# Patient Record
Sex: Male | Born: 1950
Health system: Southern US, Community
[De-identification: ages and names within clinical notes are randomized; demographics above are authoritative.]

## PROBLEM LIST (undated history)

## (undated) DIAGNOSIS — E7800 Pure hypercholesterolemia, unspecified: Secondary | ICD-10-CM

## (undated) DIAGNOSIS — I499 Cardiac arrhythmia, unspecified: Secondary | ICD-10-CM

## (undated) DIAGNOSIS — I10 Essential (primary) hypertension: Secondary | ICD-10-CM

## (undated) DIAGNOSIS — C801 Malignant (primary) neoplasm, unspecified: Secondary | ICD-10-CM

## (undated) HISTORY — PX: BIOPSY PROSTATE: PRO28

## (undated) HISTORY — PX: CERVICAL FUSION: SHX112

## (undated) HISTORY — PX: COLONOSCOPY W/ POLYPECTOMY: SHX1380

## (undated) HISTORY — PX: TONSILLECTOMY: SUR1361

---

## 2010-07-04 ENCOUNTER — Emergency Department (HOSPITAL_COMMUNITY)
Admission: EM | Admit: 2010-07-04 | Discharge: 2010-07-04 | Disposition: A | Discharge status: Home / Self Care | Payer: 59 | Attending: Emergency Medicine | Admitting: Emergency Medicine

## 2010-07-04 DIAGNOSIS — R000 Tachycardia, unspecified: Secondary | ICD-10-CM | POA: Insufficient documentation

## 2010-07-04 DIAGNOSIS — E876 Hypokalemia: Secondary | ICD-10-CM | POA: Insufficient documentation

## 2010-07-04 DIAGNOSIS — Z79899 Other long term (current) drug therapy: Secondary | ICD-10-CM | POA: Insufficient documentation

## 2010-07-04 DIAGNOSIS — I10 Essential (primary) hypertension: Secondary | ICD-10-CM | POA: Insufficient documentation

## 2010-07-04 LAB — BASIC METABOLIC PANEL
BUN: 17 mg/dL (ref 6–23)
Calcium: 8.8 mg/dL (ref 8.4–10.5)
Creatinine, Ser: 0.99 mg/dL (ref 0.4–1.5)
GFR calc non Af Amer: >60 mL/min (ref >60)
Glucose, Bld: 130 mg/dL — ABNORMAL HIGH (ref 70–99)
Sodium: 135 mEq/L (ref 135–145)

## 2010-07-04 LAB — CBC
HCT: 42 % (ref 39.0–52.0)
Platelets: 217 10*3/uL (ref 150–400)
RDW: 12.6 % (ref 11.5–15.5)
WBC: 7.4 10*3/uL (ref 4.0–10.5)

## 2010-07-04 LAB — POCT CARDIAC MARKERS
CKMB, poc: 4.5 ng/mL (ref 1.0–8.0)
Myoglobin, poc: 183 ng/mL (ref 12–200)

## 2014-08-07 ENCOUNTER — Other Ambulatory Visit: Payer: Self-pay | Admitting: Urology

## 2014-10-15 ENCOUNTER — Encounter (HOSPITAL_COMMUNITY): Payer: Self-pay

## 2014-10-15 ENCOUNTER — Encounter (HOSPITAL_COMMUNITY)
Admission: RE | Admit: 2014-10-15 | Discharge: 2014-10-15 | Disposition: A | Discharge status: Home / Self Care | Payer: PRIVATE HEALTH INSURANCE | Source: Ambulatory Visit | Attending: Urology | Admitting: Urology

## 2014-10-15 DIAGNOSIS — C61 Malignant neoplasm of prostate: Secondary | ICD-10-CM | POA: Insufficient documentation

## 2014-10-15 HISTORY — DX: Malignant (primary) neoplasm, unspecified: C80.1

## 2014-10-15 HISTORY — DX: Pure hypercholesterolemia, unspecified: E78.00

## 2014-10-15 NOTE — Patient Instructions (Addendum)
[place]  10/15/2014   Your procedure is scheduled on:   11-02-2014 Monday  Enter through [facility] and follow signs to [facility]. Arrive at 0900       AM.  (Limit 1 person with you).  Call this number if you have problems the morning of surgery: [phone]  Or Presurgical Testing [phone].   For Living Will and/or Health Care Power Attorney Forms: please provide copy for your medical record,may bring AM of surgery(Forms should be already notarized -we do not provide this service).(10-15-14 Yes/ No information preferred today).  Remember: Follow any bowel prep instructions per MD office. For Cpap use: Bring mask and tubing only.   Do not eat food/ or drink: After Midnight.      Take these medicines the morning of surgery with A SIP OF WATER:  None.   Do not wear jewelry, make-up or nail polish.  Do not wear deodorant, lotions, powders, or perfumes.   Do not shave legs and under arms- 48 hours(2 days) prior to first CHG shower.(Shaving face and neck okay.)  Do not bring valuables to the hospital.(Hospital is not responsible for lost valuables).  Contacts, dentures or removable bridgework, body piercing, hair pins may not be worn into surgery.  Leave suitcase in the car. After surgery it may be brought to your room.  For patients admitted to the hospital, checkout time is 11:00 AM the day of discharge.(Restricted visitors-Any Persons displaying flu-like symptoms or illness).    Patients discharged the day of surgery will not be allowed to drive home. Must have responsible person with you x 24 hours once discharged.  Name and phone number of your driver: Langley Gauss -spouse [phone] cell     Please read over the following fact sheets that you were given:  CHG(Chlorhexidine Gluconate 4% Surgical Soap) use, MRSA Information, Blood Transfusion fact sheet, Incentive Spirometry Instruction.  Remember : Type/Screen "Blue armbands" - may not be removed once  applied(would result in being retested AM of surgery, if removed).         [place] - Preparing for Surgery Before surgery, you can play an important role.  Because skin is not sterile, your skin needs to be as free of germs as possible.  You can reduce the number of germs on your skin by washing with CHG (chlorahexidine gluconate) soap before surgery.  CHG is an antiseptic cleaner which kills germs and bonds with the skin to continue killing germs even after washing. Please DO NOT use if you have an allergy to CHG or antibacterial soaps.  If your skin becomes reddened/irritated stop using the CHG and inform your nurse when you arrive at Short Stay. Do not shave (including legs and underarms) for at least 48 hours prior to the first CHG shower.  You may shave your face/neck. Please follow these instructions carefully:  1.  Shower with CHG Soap the night before surgery and the  morning of Surgery.  2.  If you choose to wash your hair, wash your hair first as usual with your  normal  shampoo.  3.  After you shampoo, rinse your hair and body thoroughly to remove the  shampoo.                           4.  Use CHG as you would any other liquid soap.  You can apply chg directly  to the skin and wash  Gently with a scrungie or clean washcloth.  5.  Apply the CHG Soap to your body ONLY FROM THE NECK DOWN.   Do not use on face/ open                           Wound or open sores. Avoid contact with eyes, ears mouth and genitals (private parts).                       Wash face,  Genitals (private parts) with your normal soap.             6.  Wash thoroughly, paying special attention to the area where your surgery  will be performed.  7.  Thoroughly rinse your body with warm water from the neck down.  8.  DO NOT shower/wash with your normal soap after using and rinsing off  the CHG Soap.                9.  Pat yourself dry with a clean towel.            10.  Wear clean pajamas.             11.  Place clean sheets on your bed the night of your first shower and do not  sleep with pets. Day of Surgery : Do not apply any lotions/deodorants the morning of surgery.  Please wear clean clothes to the hospital/surgery center.  FAILURE TO FOLLOW THESE INSTRUCTIONS MAY RESULT IN THE CANCELLATION OF YOUR SURGERY PATIENT SIGNATURE_________________________________  NURSE SIGNATURE__________________________________  ________________________________________________________________________   Patrick Salinas  An incentive spirometer is a tool that can help keep your lungs clear and active. This tool measures how well you are filling your lungs with each breath. Taking long deep breaths may help reverse or decrease the chance of developing breathing (pulmonary) problems (especially infection) following:  A long period of time when you are unable to move or be active. BEFORE THE PROCEDURE   If the spirometer includes an indicator to show your best effort, your nurse or respiratory therapist will set it to a desired goal.  If possible, sit up straight or lean slightly forward. Try not to slouch.  Hold the incentive spirometer in an upright position. INSTRUCTIONS FOR USE   Sit on the edge of your bed if possible, or sit up as far as you can in bed or on a chair.  Hold the incentive spirometer in an upright position.  Breathe out normally.  Place the mouthpiece in your mouth and seal your lips tightly around it.  Breathe in slowly and as deeply as possible, raising the piston or the ball toward the top of the column.  Hold your breath for 3-5 seconds or for as long as possible. Allow the piston or ball to fall to the bottom of the column.  Remove the mouthpiece from your mouth and breathe out normally.  Rest for a few seconds and repeat Steps 1 through 7 at least 10 times every 1-2 hours when you are awake. Take your time and take a few normal breaths between deep  breaths.  The spirometer may include an indicator to show your best effort. Use the indicator as a goal to work toward during each repetition.  After each set of 10 deep breaths, practice coughing to be sure your lungs are clear. If you have an incision (the cut made at the time of  surgery), support your incision when coughing by placing a pillow or rolled up towels firmly against it. Once you are able to get out of bed, walk around indoors and cough well. You may stop using the incentive spirometer when instructed by your caregiver.  RISKS AND COMPLICATIONS  Take your time so you do not get dizzy or light-headed.  If you are in pain, you may need to take or ask for pain medication before doing incentive spirometry. It is harder to take a deep breath if you are having pain. AFTER USE  Rest and breathe slowly and easily.  It can be helpful to keep track of a log of your progress. Your caregiver can provide you with a simple table to help with this. If you are using the spirometer at home, follow these instructions: [place] IF:   You are having difficultly using the spirometer.  You have trouble using the spirometer as often as instructed.  Your pain medication is not giving enough relief while using the spirometer.  You develop fever of 100.5 F (38.1 C) or higher. SEEK IMMEDIATE MEDICAL CARE IF:   You cough up bloody sputum that had not been present before.  You develop fever of 102 F (38.9 C) or greater.  You develop worsening pain at or near the incision site. MAKE SURE YOU:   Understand these instructions.  Will watch your condition.  Will get help right away if you are not doing well or get worse. Document Released: 07/24/2006 Document Revised: 06/05/2011 Document Reviewed: 09/24/2006 ExitCare Patient Information 2014 Exit[employer], [place].   ________________________________________________________________________  WHAT IS A BLOOD TRANSFUSION? Blood  Transfusion Information  A transfusion is the replacement of blood or some of its parts. Blood is made up of multiple cells which provide different functions.  Red blood cells carry oxygen and are used for blood loss replacement.  White blood cells fight against infection.  Platelets control bleeding.  Plasma helps clot blood.  Other blood products are available for specialized needs, such as hemophilia or other clotting disorders. BEFORE THE TRANSFUSION  Who gives blood for transfusions?   Healthy volunteers who are fully evaluated to make sure their blood is safe. This is blood bank blood. Transfusion therapy is the safest it has ever been in the practice of medicine. Before blood is taken from a donor, a complete history is taken to make sure that person has no history of diseases nor engages in risky social behavior (examples are intravenous drug use or sexual activity with multiple partners). The donor's travel history is screened to minimize risk of transmitting infections, such as malaria. The donated blood is tested for signs of infectious diseases, such as HIV and hepatitis. The blood is then tested to be sure it is compatible with you in order to minimize the chance of a transfusion reaction. If you or a relative donates blood, this is often done in anticipation of surgery and is not appropriate for emergency situations. It takes many days to process the donated blood. RISKS AND COMPLICATIONS Although transfusion therapy is very safe and saves many lives, the main dangers of transfusion include:   Getting an infectious disease.  Developing a transfusion reaction. This is an allergic reaction to something in the blood you were given. Every precaution is taken to prevent this. The decision to have a blood transfusion has been considered carefully by your caregiver before blood is given. Blood is not given unless the benefits outweigh the risks. AFTER THE TRANSFUSION  Right after  receiving a blood transfusion, you will usually feel much better and more energetic. This is especially true if your red blood cells have gotten low (anemic). The transfusion raises the level of the red blood cells which carry oxygen, and this usually causes an energy increase.  The nurse administering the transfusion will monitor you carefully for complications. HOME CARE INSTRUCTIONS  No special instructions are needed after a transfusion. You may find your energy is better. Speak with your caregiver about any limitations on activity for underlying diseases you may have. SEEK MEDICAL CARE IF:   Your condition is not improving after your transfusion.  You develop redness or irritation at the intravenous (IV) site. SEEK IMMEDIATE MEDICAL CARE IF:  Any of the following symptoms occur over the next 12 hours:  Shaking chills.  You have a temperature by mouth above 102 F (38.9 C), not controlled by medicine.  Chest, back, or muscle pain.  People around you feel you are not acting correctly or are confused.  Shortness of breath or difficulty breathing.  Dizziness and fainting.  You get a rash or develop hives.  You have a decrease in urine output.  Your urine turns a dark color or changes to pink, red, or brown. Any of the following symptoms occur over the next 10 days:  You have a temperature by mouth above 102 F (38.9 C), not controlled by medicine.  Shortness of breath.  Weakness after normal activity.  The white part of the eye turns yellow (jaundice).  You have a decrease in the amount of urine or are urinating less often.  Your urine turns a dark color or changes to pink, red, or brown. Document Released: 03/10/2000 Document Revised: 06/05/2011 Document Reviewed: 10/28/2007 [facility] Patient Information 2014 [place], [place].  _______________________________________________________________________

## 2014-10-15 NOTE — Pre-Procedure Instructions (Signed)
EKG/ CXR  10-08-14 reports, labs CBC/d, CMP results with chart- done at [facility].

## 2014-10-21 NOTE — H&P (Signed)
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 This prompted a prostate needle biopsy by Dr. Risa Grill on 4/27/6 that demonstrated Gleason 3+3=6 adenocarcinoma of the prostate with 3 out of 12 biopsy cores positive for malignancy. He is very healthy overall with well controlled hypertension and dyslipidemia. He has a questionable paternal family history of prostate cancer.    TNM stage: cT1c Nx Mx  PSA: 5.13  Gleason score: 3+3=6  Biopsy (07/21/24): 3/12 cores positive -- L lateral apex (30%), L apex (< 5%), R lateral base (70% - DI)  Prostate volume: 34 cc  PSAD: 0.15    Nomogram  OC disease: 61%  EPE: 39%  SVI: 1%  LNI: 1%  PFS (surgery): 95% at 3 years, 90% at 8 years     Urinary function: He denies any significant voiding symptoms. IPSS is 0.  Erectile function: He denies erectile dysfunction. SHIM score is 25.     Interval history:    He follows up today for a preoperative visit prior to his upcoming surgery. He continues to wish to proceed with surgical treatment of his prostate cancer and remains well informed. He assures me that he has had no changes in his overall health over the past 2 months. He has stopped Lipitor with resolution of his knee pain.     Past Medical History Problems  1. History of hypercholesterolemia (Z86.39) 2. History of hypertension (Z86.79)  Surgical History Problems  1. History of Adenoidectomy 2. History of Neck Surgery 3. History of Tonsillectomy  Current Meds 1. Hydrochlorothiazide 25 MG Oral Tablet;  Therapy: (Recorded:14Apr2016) to Recorded  Allergies Medication  1. No Known Drug Allergies Non-Medication  2. Fresh Fruit  Family History Problems  1. Family history of Deceased : Mother, Father 2. Family history of congestive heart failure (Z82.49) :  Mother 3. Denied: Family history of prostate cancer  Social History Problems  1. Adopted a child   2 sons 2. Alcohol use (Z78.9)   1 per day 3. Married 4. 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 No acute distress.  ENT:. The ears and nose are normal in appearance.  Neck: The appearance of the neck is normal and no neck mass is present.  Pulmonary: No respiratory distress, normal respiratory rhythm and effort and clear bilateral breath sounds.  Cardiovascular: Heart rate and rhythm are normal . No peripheral edema.  Abdomen: The abdomen is soft and nontender. No masses are palpated. No CVA tenderness. No hernias are palpable. No hepatosplenomegaly noted.  Neuro/Psych:. Mood and affect are appropriate.    Results/Data Urine [Data Includes: Last 1 Day]   [id number]  COLOR YELLOW   APPEARANCE CLEAR   SPECIFIC GRAVITY 1.025   pH 6.0   GLUCOSE NEG mg/dL  BILIRUBIN NEG   KETONE NEG mg/dL  BLOOD NEG   PROTEIN NEG mg/dL  UROBILINOGEN 0.2 mg/dL  NITRITE NEG   LEUKOCYTE ESTERASE NEG   Selected Results  PSA REFLEX TO FREE 14Apr2016 02:16PM Rana Snare  SPECIMEN TYPE: BLOOD   Test Name Result Flag  Reference  PSA 5.13 ng/mL H <=4.00  TEST METHODOLOGY: ECLIA PSA (ELECTROCHEMILUMINESCENCE IMMUNOASSAY)  PSA, FREE 0.28 ng/mL    PSA, %FREE 5 % L > 25  PROBABILITY OF PROSTATE CANCER   (FOR MEN WITH NON-SUSPICIOUS DRE RESULTS AND PSA BETWEEN 4 AND   10 NG/ML, BY PATIENT AGE)     % FREE PSA                          PATIENT AGE                          50 TO 59 YEARS  60 TO 69 YEARS  >70 YEARS    <=10%                  49.2%           57.5%          64.5%    11 - 18%               26.9%           33.9%           40.8%    19 - 25%               18.3%           23.9%          29.7%    >25%                    9.1%           12.2%          15.8%   Assessment Assessed  1. Prostate cancer (C61)  Plan Health Maintenance  1. UA With REFLEX; [Do Not Release]; Status:Complete;   Done: [id number] 03:54PM  Discussion/Summary 1. Prostate cancer: He does wish to proceed with surgical treatment of his prostate cancer as we have previously discussed. We have reviewed the potential risks and benefits of treatment as well as the expected postoperative recovery process. He feels very well informed and is ready to proceed. He will undergo a bilateral nerve sparing robot-assisted laparoscopic radical prostatectomy.    Cc: Audria Nine, NP     Signatures Electronically signed by : Raynelle Bring, M.D.; Oct 15 2014  5:27PM EST

## 2014-11-02 ENCOUNTER — Inpatient Hospital Stay (HOSPITAL_COMMUNITY)
Admission: RE | Admit: 2014-11-02 | Discharge: 2014-11-03 | DRG: 708 | Disposition: A | Discharge status: Home / Self Care | Payer: PRIVATE HEALTH INSURANCE | Source: Ambulatory Visit | Attending: Urology | Admitting: Urology

## 2014-11-02 ENCOUNTER — Inpatient Hospital Stay (HOSPITAL_COMMUNITY): Payer: PRIVATE HEALTH INSURANCE | Admitting: Anesthesiology

## 2014-11-02 ENCOUNTER — Encounter (HOSPITAL_COMMUNITY): Payer: Self-pay | Admitting: *Deleted

## 2014-11-02 ENCOUNTER — Encounter (HOSPITAL_COMMUNITY)
Admission: RE | Disposition: A | Discharge status: Home / Self Care | Payer: Self-pay | Source: Ambulatory Visit | Attending: Urology

## 2014-11-02 DIAGNOSIS — Z01812 Encounter for preprocedural laboratory examination: Secondary | ICD-10-CM

## 2014-11-02 DIAGNOSIS — Z8249 Family history of ischemic heart disease and other diseases of the circulatory system: Secondary | ICD-10-CM

## 2014-11-02 DIAGNOSIS — E785 Hyperlipidemia, unspecified: Secondary | ICD-10-CM | POA: Diagnosis present

## 2014-11-02 DIAGNOSIS — I10 Essential (primary) hypertension: Secondary | ICD-10-CM | POA: Diagnosis present

## 2014-11-02 DIAGNOSIS — C61 Malignant neoplasm of prostate: Secondary | ICD-10-CM | POA: Diagnosis not present

## 2014-11-02 HISTORY — PX: ROBOT ASSISTED LAPAROSCOPIC RADICAL PROSTATECTOMY: SHX5141

## 2014-11-02 LAB — HEMOGLOBIN AND HEMATOCRIT, BLOOD
HCT: 42.6 % (ref 39.0–52.0)
Hemoglobin: 14.7 g/dL (ref 13.0–17.0)

## 2014-11-02 LAB — TYPE AND SCREEN
ABO/RH(D): O POS
Antibody Screen: NEGATIVE

## 2014-11-02 SURGERY — ROBOTIC ASSISTED LAPAROSCOPIC RADICAL PROSTATECTOMY LEVEL 1
Anesthesia: General

## 2014-11-02 MED ORDER — MIDAZOLAM HCL 2 MG/2ML IJ SOLN
INTRAMUSCULAR | Status: AC
  Filled 2014-11-02: qty 4

## 2014-11-02 MED ORDER — HYDROMORPHONE HCL 2 MG/ML IJ SOLN
INTRAMUSCULAR | Status: AC
  Filled 2014-11-02: qty 1

## 2014-11-02 MED ORDER — BUPIVACAINE-EPINEPHRINE 0.25% -1:200000 IJ SOLN
INTRAMUSCULAR | Status: DC | PRN
  Administered 2014-11-02: 30 mL

## 2014-11-02 MED ORDER — LIDOCAINE HCL (CARDIAC) 20 MG/ML IV SOLN
INTRAVENOUS | Status: AC
  Filled 2014-11-02: qty 5

## 2014-11-02 MED ORDER — PHENYLEPHRINE 40 MCG/ML (10ML) SYRINGE FOR IV PUSH (FOR BLOOD PRESSURE SUPPORT)
PREFILLED_SYRINGE | INTRAVENOUS | Status: AC
  Filled 2014-11-02: qty 10

## 2014-11-02 MED ORDER — MORPHINE SULFATE 10 MG/ML IJ SOLN: 2.0000 mg | INTRAMUSCULAR | Status: DC | PRN

## 2014-11-02 MED ORDER — FENTANYL CITRATE (PF) 100 MCG/2ML IJ SOLN
INTRAMUSCULAR | Status: DC | PRN
  Administered 2014-11-02: 100 ug via INTRAVENOUS
  Administered 2014-11-02 (×3): 50 ug via INTRAVENOUS

## 2014-11-02 MED ORDER — HYDROMORPHONE HCL 1 MG/ML IJ SOLN
INTRAMUSCULAR | Status: AC
  Filled 2014-11-02: qty 1

## 2014-11-02 MED ORDER — PROPOFOL 10 MG/ML IV BOLUS
INTRAVENOUS | Status: DC | PRN
  Administered 2014-11-02: 180 mg via INTRAVENOUS

## 2014-11-02 MED ORDER — PROPOFOL 10 MG/ML IV BOLUS
INTRAVENOUS | Status: AC
  Filled 2014-11-02: qty 20

## 2014-11-02 MED ORDER — HYDROMORPHONE HCL 1 MG/ML IJ SOLN
INTRAMUSCULAR | Status: DC | PRN
  Administered 2014-11-02: 0.5 mg via INTRAVENOUS
  Administered 2014-11-02: 1 mg via INTRAVENOUS
  Administered 2014-11-02: 0.5 mg via INTRAVENOUS

## 2014-11-02 MED ORDER — ROCURONIUM BROMIDE 100 MG/10ML IV SOLN
INTRAVENOUS | Status: AC
  Filled 2014-11-02: qty 1

## 2014-11-02 MED ORDER — BUPIVACAINE-EPINEPHRINE (PF) 0.25% -1:200000 IJ SOLN
INTRAMUSCULAR | Status: AC
  Filled 2014-11-02: qty 30

## 2014-11-02 MED ORDER — ROCURONIUM BROMIDE 100 MG/10ML IV SOLN
INTRAVENOUS | Status: DC | PRN
  Administered 2014-11-02: 20 mg via INTRAVENOUS
  Administered 2014-11-02: 35 mg via INTRAVENOUS
  Administered 2014-11-02: 20 mg via INTRAVENOUS
  Administered 2014-11-02: 10 mg via INTRAVENOUS
  Administered 2014-11-02: 5 mg via INTRAVENOUS

## 2014-11-02 MED ORDER — ACETAMINOPHEN 325 MG PO TABS
650.0000 mg | ORAL_TABLET | ORAL | Status: DC | PRN
  Administered 2014-11-03: 650 mg via ORAL
  Filled 2014-11-02: qty 2

## 2014-11-02 MED ORDER — CEFAZOLIN SODIUM-DEXTROSE 2-3 GM-% IV SOLR
INTRAVENOUS | Status: AC
  Filled 2014-11-02: qty 50

## 2014-11-02 MED ORDER — SODIUM CHLORIDE 0.9 % IR SOLN
Status: DC | PRN
  Administered 2014-11-02: 1000 mL

## 2014-11-02 MED ORDER — ONDANSETRON HCL 4 MG/2ML IJ SOLN
INTRAMUSCULAR | Status: AC
  Filled 2014-11-02: qty 2

## 2014-11-02 MED ORDER — DIPHENHYDRAMINE HCL 50 MG/ML IJ SOLN
12.5000 mg | Freq: Four times a day (QID) | INTRAMUSCULAR | Status: DC | PRN
Start: 2014-11-02 — End: 2014-11-03

## 2014-11-02 MED ORDER — MIDAZOLAM HCL 5 MG/5ML IJ SOLN
INTRAMUSCULAR | Status: DC | PRN
  Administered 2014-11-02: 2 mg via INTRAVENOUS

## 2014-11-02 MED ORDER — CEFAZOLIN SODIUM 1-5 GM-% IV SOLN
1.0000 g | Freq: Three times a day (TID) | INTRAVENOUS | Status: AC
  Administered 2014-11-02 – 2014-11-03 (×2): 1 g via INTRAVENOUS
  Filled 2014-11-02 (×3): qty 50

## 2014-11-02 MED ORDER — SULFAMETHOXAZOLE-TRIMETHOPRIM 800-160 MG PO TABS: 1.0000 | ORAL_TABLET | Freq: Two times a day (BID) | ORAL | Status: DC

## 2014-11-02 MED ORDER — DEXAMETHASONE SODIUM PHOSPHATE 10 MG/ML IJ SOLN
INTRAMUSCULAR | Status: DC | PRN
  Administered 2014-11-02: 10 mg via INTRAVENOUS

## 2014-11-02 MED ORDER — LACTATED RINGERS IV SOLN
INTRAVENOUS | Status: DC
  Administered 2014-11-02: 1000 mL via INTRAVENOUS
  Administered 2014-11-02 (×2): via INTRAVENOUS

## 2014-11-02 MED ORDER — HYDROMORPHONE HCL 1 MG/ML IJ SOLN
0.2500 mg | INTRAMUSCULAR | Status: DC | PRN
  Administered 2014-11-02 (×4): 0.5 mg via INTRAVENOUS

## 2014-11-02 MED ORDER — SODIUM CHLORIDE 0.9 % IV BOLUS (SEPSIS): 1000.0000 mL | Freq: Once | INTRAVENOUS | Status: DC

## 2014-11-02 MED ORDER — GLYCOPYRROLATE 0.2 MG/ML IJ SOLN
INTRAMUSCULAR | Status: DC | PRN
  Administered 2014-11-02: 0.6 mg via INTRAVENOUS

## 2014-11-02 MED ORDER — PHENYLEPHRINE HCL 10 MG/ML IJ SOLN
INTRAMUSCULAR | Status: DC | PRN
  Administered 2014-11-02 (×4): 40 ug via INTRAVENOUS

## 2014-11-02 MED ORDER — ONDANSETRON HCL 4 MG/2ML IJ SOLN
INTRAMUSCULAR | Status: DC | PRN
  Administered 2014-11-02: 4 mg via INTRAVENOUS

## 2014-11-02 MED ORDER — KETOROLAC TROMETHAMINE 15 MG/ML IJ SOLN
15.0000 mg | Freq: Four times a day (QID) | INTRAMUSCULAR | Status: DC
  Administered 2014-11-02 – 2014-11-03 (×4): 15 mg via INTRAVENOUS
  Filled 2014-11-02 (×4): qty 1

## 2014-11-02 MED ORDER — KCL IN DEXTROSE-NACL 20-5-0.45 MEQ/L-%-% IV SOLN
INTRAVENOUS | Status: DC
  Administered 2014-11-02 – 2014-11-03 (×3): via INTRAVENOUS
  Filled 2014-11-02 (×4): qty 1000

## 2014-11-02 MED ORDER — FENTANYL CITRATE (PF) 250 MCG/5ML IJ SOLN
INTRAMUSCULAR | Status: AC
  Filled 2014-11-02: qty 25

## 2014-11-02 MED ORDER — DOCUSATE SODIUM 100 MG PO CAPS
100.0000 mg | ORAL_CAPSULE | Freq: Two times a day (BID) | ORAL | Status: DC
  Administered 2014-11-02 – 2014-11-03 (×2): 100 mg via ORAL
  Filled 2014-11-02 (×5): qty 1

## 2014-11-02 MED ORDER — CEFAZOLIN SODIUM-DEXTROSE 2-3 GM-% IV SOLR
2.0000 g | INTRAVENOUS | Status: AC
  Administered 2014-11-02: 2 g via INTRAVENOUS

## 2014-11-02 MED ORDER — SUCCINYLCHOLINE CHLORIDE 20 MG/ML IJ SOLN
INTRAMUSCULAR | Status: DC | PRN
  Administered 2014-11-02: 100 mg via INTRAVENOUS

## 2014-11-02 MED ORDER — HEPARIN SODIUM (PORCINE) 1000 UNIT/ML IJ SOLN
INTRAMUSCULAR | Status: AC
  Filled 2014-11-02: qty 1

## 2014-11-02 MED ORDER — NEOSTIGMINE METHYLSULFATE 10 MG/10ML IV SOLN
INTRAVENOUS | Status: DC | PRN
  Administered 2014-11-02: 4 mg via INTRAVENOUS

## 2014-11-02 MED ORDER — HEPARIN SODIUM (PORCINE) 1000 UNIT/ML IJ SOLN
INTRAMUSCULAR | Status: DC | PRN
  Administered 2014-11-02: 13:00:00

## 2014-11-02 MED ORDER — HYDROCODONE-ACETAMINOPHEN 5-325 MG PO TABS: 1.0000 | ORAL_TABLET | Freq: Four times a day (QID) | ORAL | Status: DC | PRN

## 2014-11-02 MED ORDER — DIPHENHYDRAMINE HCL 12.5 MG/5ML PO ELIX
12.5000 mg | ORAL_SOLUTION | Freq: Four times a day (QID) | ORAL | Status: DC | PRN
  Filled 2014-11-02: qty 10

## 2014-11-02 MED ORDER — DEXAMETHASONE SODIUM PHOSPHATE 10 MG/ML IJ SOLN
INTRAMUSCULAR | Status: AC
  Filled 2014-11-02: qty 1

## 2014-11-02 SURGICAL SUPPLY — 50 items
CABLE HIGH FREQUENCY MONO STRZ (ELECTRODE) ×3 IMPLANT
CATH FOLEY 2WAY SLVR 18FR 30CC (CATHETERS) ×3 IMPLANT
CATH ROBINSON RED A/P 16FR (CATHETERS) ×3 IMPLANT
CATH ROBINSON RED A/P 8FR (CATHETERS) ×3 IMPLANT
CATH TIEMANN FOLEY 18FR 5CC (CATHETERS) ×3 IMPLANT
CHLORAPREP W/TINT 26ML (MISCELLANEOUS) ×3 IMPLANT
CLIP LIGATING HEM O LOK PURPLE (MISCELLANEOUS) ×6 IMPLANT
CLOTH BEACON ORANGE TIMEOUT ST (SAFETY) ×3 IMPLANT
COVER SURGICAL LIGHT HANDLE (MISCELLANEOUS) ×3 IMPLANT
COVER TIP SHEARS 8 DVNC (MISCELLANEOUS) ×1 IMPLANT
COVER TIP SHEARS 8MM DA VINCI (MISCELLANEOUS) ×2
CUTTER ECHEON FLEX ENDO 45 340 (ENDOMECHANICALS) ×3 IMPLANT
DECANTER SPIKE VIAL GLASS SM (MISCELLANEOUS) IMPLANT
DRAPE SURG IRRIG POUCH 19X23 (DRAPES) ×3 IMPLANT
DRSG TEGADERM 4X4.75 (GAUZE/BANDAGES/DRESSINGS) ×3 IMPLANT
DRSG TEGADERM 6X8 (GAUZE/BANDAGES/DRESSINGS) IMPLANT
ELECT REM PT RETURN 9FT ADLT (ELECTROSURGICAL) ×3
ELECTRODE REM PT RTRN 9FT ADLT (ELECTROSURGICAL) ×1 IMPLANT
GLOVE BIO SURGEON STRL SZ 6.5 (GLOVE) ×2 IMPLANT
GLOVE BIO SURGEONS STRL SZ 6.5 (GLOVE) ×1
GLOVE BIOGEL M STRL SZ7.5 (GLOVE) ×6 IMPLANT
GOWN STRL REUS W/TWL LRG LVL3 (GOWN DISPOSABLE) ×12 IMPLANT
HOLDER FOLEY CATH W/STRAP (MISCELLANEOUS) ×3 IMPLANT
IV LACTATED RINGERS 1000ML (IV SOLUTION) IMPLANT
KIT ACCESSORY DA VINCI DISP (KITS) ×2
KIT ACCESSORY DVNC DISP (KITS) ×1 IMPLANT
LIQUID BAND (GAUZE/BANDAGES/DRESSINGS) ×3 IMPLANT
NDL SAFETY ECLIPSE 18X1.5 (NEEDLE) ×1 IMPLANT
NEEDLE HYPO 18GX1.5 SHARP (NEEDLE) ×2
PACK ROBOT UROLOGY CUSTOM (CUSTOM PROCEDURE TRAY) ×3 IMPLANT
RELOAD GREEN ECHELON 45 (STAPLE) ×3 IMPLANT
SCISSORS LAP 5X35 DISP (ENDOMECHANICALS) ×3 IMPLANT
SET TUBE IRRIG SUCTION NO TIP (IRRIGATION / IRRIGATOR) ×3 IMPLANT
SHEET LAVH (DRAPES) IMPLANT
SOLUTION ELECTROLUBE (MISCELLANEOUS) ×3 IMPLANT
SUT ETHILON 3 0 PS 1 (SUTURE) ×3 IMPLANT
SUT MNCRL 3 0 RB1 (SUTURE) ×1 IMPLANT
SUT MNCRL 3 0 VIOLET RB1 (SUTURE) ×3 IMPLANT
SUT MNCRL AB 4-0 PS2 18 (SUTURE) ×6 IMPLANT
SUT MONOCRYL 3 0 RB1 (SUTURE) ×8
SUT VIC AB 0 CT1 27 (SUTURE) ×2
SUT VIC AB 0 CT1 27XBRD ANTBC (SUTURE) ×1 IMPLANT
SUT VIC AB 0 CT1 36 (SUTURE) ×3 IMPLANT
SUT VIC AB 2-0 SH 27 (SUTURE) ×2
SUT VIC AB 2-0 SH 27X BRD (SUTURE) ×1 IMPLANT
SUT VICRYL 0 UR6 27IN ABS (SUTURE) ×6 IMPLANT
SYR 27GX1/2 1ML LL SAFETY (SYRINGE) ×3 IMPLANT
TOWEL OR 17X26 10 PK STRL BLUE (TOWEL DISPOSABLE) ×3 IMPLANT
TOWEL OR NON WOVEN STRL DISP B (DISPOSABLE) ×3 IMPLANT
WATER STERILE IRR 1500ML POUR (IV SOLUTION) IMPLANT

## 2014-11-02 NOTE — Transfer of Care (Signed)
Immediate Anesthesia Transfer of Care Note  Patient: Patrick Salinas  Procedure(s) Performed: Procedure(s): ROBOTIC ASSISTED LAPAROSCOPIC RADICAL PROSTATECTOMY LEVEL 1 (N/A)  Patient Location: PACU  Anesthesia Type:General  Level of Consciousness: awake, alert  and oriented  Airway & Oxygen Therapy: Patient Spontanous Breathing and Patient connected to face mask oxygen  Post-op Assessment: Report given to RN and Post -op Vital signs reviewed and stable  Post vital signs: Reviewed and stable  Last Vitals:  Filed Vitals:   11/02/14 0843  BP: 154/91  Pulse: 81  Temp: 36.4 C  Resp: 18    Complications: No apparent anesthesia complications

## 2014-11-02 NOTE — Progress Notes (Signed)
Patient ID: Patrick Salinas, male   DOB: April 29, 1950, 64 y.o.   MRN: [id number]  Post-op note  Subjective: The patient is doing well.  No complaints.  Objective: Vital signs in last 24 hours: Temp:  [97.4 F (36.3 C)-98.4 F (36.9 C)] 98.4 F (36.9 C) (08/08 1814) Pulse Rate:  [78-85] 83 (08/08 1814) Resp:  [9-18] 14 (08/08 1814) BP: (140-155)/(77-91) 155/80 mmHg (08/08 1814) SpO2:  [94 %-100 %] 96 % (08/08 1814) Weight:  [89.812 kg (198 lb)] 89.812 kg (198 lb) (08/08 1700)  Intake/Output from previous day:   Intake/Output this shift:    Physical Exam:  General: Alert and oriented. Abdomen: Soft, Nondistended. Incisions: Clean and dry.  Lab Results:  Recent Labs  11/02/14 1457  HGB 14.7  HCT 42.6    Assessment/Plan: POD#0   1) Continue to monitor, ambulate, IS   Pryor Curia. MD   LOS: 0 days   Patrick Salinas,LES 11/02/2014, 8:04 PM

## 2014-11-02 NOTE — Progress Notes (Signed)
Pt very sleepy after anesthesia.  Will make night RN aware of need to ambulate.

## 2014-11-02 NOTE — Anesthesia Preprocedure Evaluation (Addendum)
Anesthesia Evaluation  Patient identified by MRN, date of birth, ID band Patient awake    Reviewed: Allergy & Precautions, NPO status , Patient's Chart, lab work & pertinent test results  Airway Mallampati: II  TM Distance: >3 FB Neck ROM: Full    Dental no notable dental hx.    Pulmonary neg pulmonary ROS,  breath sounds clear to auscultation  Pulmonary exam normal       Cardiovascular negative cardio ROS Normal cardiovascular examRhythm:Regular Rate:Normal     Neuro/Psych negative neurological ROS  negative psych ROS   GI/Hepatic negative GI ROS, Neg liver ROS,   Endo/Other  negative endocrine ROS  Renal/GU negative Renal ROS  negative genitourinary   Musculoskeletal negative musculoskeletal ROS (+)   Abdominal   Peds negative pediatric ROS (+)  Hematology negative hematology ROS (+)   Anesthesia Other Findings   Reproductive/Obstetrics negative OB ROS                             Anesthesia Physical Anesthesia Plan  ASA: II  Anesthesia Plan: General   Post-op Pain Management:    Induction: Intravenous  Airway Management Planned: Oral ETT  Additional Equipment:   Intra-op Plan:   Post-operative Plan: Extubation in OR  Informed Consent: I have reviewed the patients History and Physical, chart, labs and discussed the procedure including the risks, benefits and alternatives for the proposed anesthesia with the patient or authorized representative who has indicated his/her understanding and acceptance.   Dental advisory given  Plan Discussed with: CRNA  Anesthesia Plan Comments:         Anesthesia Quick Evaluation

## 2014-11-02 NOTE — Op Note (Signed)
Preoperative diagnosis: Clinically localized adenocarcinoma of the prostate (clinical stage T1c Nx Mx)  Postoperative diagnosis: Clinically localized adenocarcinoma of the prostate (clinical stage T1c Nx Mx)  Procedure:  1. Robotic assisted laparoscopic radical prostatectomy (bilateral nerve sparing)  Surgeon: Roxy Horseman, Brooke Bonito. M.D.  Assistant: Debbrah Alar, PA-C  Anesthesia: General  Complications: None  EBL: 200 mL  IVF:  2200 mL crystalloid  Specimens: 1. Prostate and seminal vesicles 2. Left posterolateral margin  Disposition of specimens: Pathology  Drains: 1. 20 Fr coude catheter 2. # 19 Blake pelvic drain  Indication: Patrick Salinas is a 64 y.o. year old patient with clinically localized prostate cancer.  After a thorough review of the management options for treatment of prostate cancer, he elected to proceed with surgical therapy and the above procedure(s).  We have discussed the potential benefits and risks of the procedure, side effects of the proposed treatment, the likelihood of the patient achieving the goals of the procedure, and any potential problems that might occur during the procedure or recuperation. Informed consent has been obtained.  Description of procedure:  The patient was taken to the operating room and a general anesthetic was administered. He was given preoperative antibiotics, placed in the dorsal lithotomy position, and prepped and draped in the usual sterile fashion. Next a preoperative timeout was performed. A urethral catheter was placed into the bladder and a site was selected near the umbilicus for placement of the camera port. This was placed using a standard open Hassan technique which allowed entry into the peritoneal cavity under direct vision and without difficulty. A 12 mm port was placed and a pneumoperitoneum established. The camera was then used to inspect the abdomen and there was no evidence of any intra-abdominal injuries or other  abnormalities. The remaining abdominal ports were then placed. 8 mm robotic ports were placed in the right lower quadrant, left lower quadrant, and far left lateral abdominal wall. A 5 mm port was placed in the right upper quadrant and a 12 mm port was placed in the right lateral abdominal wall for laparoscopic assistance. All ports were placed under direct vision without difficulty. The surgical cart was then docked.   Utilizing the cautery scissors, the bladder was reflected posteriorly allowing entry into the space of Retzius and identification of the endopelvic fascia and prostate. The periprostatic fat was then removed from the prostate allowing full exposure of the endopelvic fascia. The endopelvic fascia was then incised from the apex back to the base of the prostate bilaterally and the underlying levator muscle fibers were swept laterally off the prostate thereby isolating the dorsal venous complex. The dorsal vein was then stapled and divided with a 45 mm Flex Echelon stapler. Attention then turned to the bladder neck which was divided anteriorly thereby allowing entry into the bladder and exposure of the urethral catheter. The catheter balloon was deflated and the catheter was brought into the operative field and used to retract the prostate anteriorly. The posterior bladder neck was then examined and was divided allowing further dissection between the bladder and prostate posteriorly until the vasa deferentia and seminal vessels were identified. The vasa deferentia were isolated, divided, and lifted anteriorly. The seminal vesicles were dissected down to their tips with care to control the seminal vascular arterial blood supply. These structures were then lifted anteriorly and the space between Denonvillier's fascia and the anterior rectum was developed with a combination of sharp and blunt dissection. This isolated the vascular pedicles of the prostate.  The lateral prostatic fascia was then sharply  incised allowing release of the neurovascular bundles bilaterally. There was some adhesion of the left posterolateral prostate to the neurovascular bundle tissue.  An additional margin of tissue was obtained in this area to ensure no extraprostatic extension was present. The vascular pedicles of the prostate were then ligated with Weck clips between the prostate and neurovascular bundles and divided with sharp cold scissor dissection resulting in neurovascular bundle preservation. The neurovascular bundles were then separated off the apex of the prostate and urethra bilaterally.  The urethra was then sharply transected allowing the prostate specimen to be disarticulated. The pelvis was copiously irrigated and hemostasis was ensured. There was no evidence for rectal injury.  Attention then turned to the urethral anastomosis. A 2-0 Vicryl slip knot was placed between Denonvillier's fascia, the posterior bladder neck, and the posterior urethra to reapproximate these structures. A double-armed 3-0 Monocryl suture was then used to perform a 360 running tension-free anastomosis between the bladder neck and urethra. A new urethral catheter was then placed into the bladder and irrigated. There were no blood clots within the bladder and the anastomosis appeared to be watertight. A #19 Blake drain was then brought through the left lateral 8 mm port site and positioned appropriately within the pelvis. It was secured to the skin with a nylon suture. The surgical cart was then undocked. The right lateral 12 mm port site was closed at the fascial level with a 0 Vicryl suture placed laparoscopically. All remaining ports were then removed under direct vision. The prostate specimen was removed intact within the Endopouch retrieval bag via the periumbilical camera port site. This fascial opening was closed with two running 0 Vicryl sutures. 0.25% Marcaine was then injected into all port sites and all incisions were  reapproximated at the skin level with 4-0 Monocryl subcuticular sutures and Dermabond. The patient appeared to tolerate the procedure well and without complications. The patient was able to be extubated and transferred to the recovery unit in satisfactory condition.  Pryor Curia MD

## 2014-11-02 NOTE — Anesthesia Postprocedure Evaluation (Signed)
  Anesthesia Post-op Note  Patient: Patrick Salinas  Procedure(s) Performed: Procedure(s) (LRB): ROBOTIC ASSISTED LAPAROSCOPIC RADICAL PROSTATECTOMY LEVEL 1 (N/A)  Patient Location: PACU  Anesthesia Type: General  Level of Consciousness: awake and alert   Airway and Oxygen Therapy: Patient Spontanous Breathing  Post-op Pain: mild  Post-op Assessment: Post-op Vital signs reviewed, Patient's Cardiovascular Status Stable, Respiratory Function Stable, Patent Airway and No signs of Nausea or vomiting  Last Vitals:  Filed Vitals:   11/02/14 0843  BP: 154/91  Pulse: 81  Temp: 36.4 C  Resp: 18    Post-op Vital Signs: stable   Complications: No apparent anesthesia complications

## 2014-11-02 NOTE — Interval H&P Note (Signed)
History and Physical Interval Note:  11/02/2014 10:34 AM  Patrick Salinas  has presented today for surgery, with the diagnosis of PROSTATE CANCER  The various methods of treatment have been discussed with the patient and family. After consideration of risks, benefits and other options for treatment, the patient has consented to  Procedure(s): ROBOTIC ASSISTED LAPAROSCOPIC RADICAL PROSTATECTOMY LEVEL 1 (N/A) as a surgical intervention .  The patient's history has been reviewed, patient examined, no change in status, stable for surgery.  I have reviewed the patient's chart and labs.  Questions were answered to the patient's satisfaction.     Erynn Vaca,LES

## 2014-11-02 NOTE — Anesthesia Procedure Notes (Signed)
Procedure Name: Intubation Date/Time: 11/02/2014 11:43 AM Performed by: Noralyn Pick D Pre-anesthesia Checklist: Patient identified, Emergency Drugs available, Suction available and Patient being monitored Patient Re-evaluated:Patient Re-evaluated prior to inductionOxygen Delivery Method: Circle System Utilized Preoxygenation: Pre-oxygenation with 100% oxygen Intubation Type: IV induction Ventilation: Mask ventilation without difficulty Laryngoscope Size: Mac and 4 Grade View: Grade III Tube type: Oral Tube size: 7.5 mm Number of attempts: 1 Airway Equipment and Method: Stylet and Oral airway Placement Confirmation: ETT inserted through vocal cords under direct vision,  positive ETCO2 and breath sounds checked- equal and bilateral Secured at: 22 cm Tube secured with: Tape Dental Injury: Teeth and Oropharynx as per pre-operative assessment

## 2014-11-02 NOTE — Discharge Instructions (Signed)

## 2014-11-03 ENCOUNTER — Encounter (HOSPITAL_COMMUNITY): Payer: Self-pay | Admitting: Urology

## 2014-11-03 LAB — HEMOGLOBIN AND HEMATOCRIT, BLOOD
HCT: 41.9 % (ref 39.0–52.0)
HEMOGLOBIN: 14.5 g/dL (ref 13.0–17.0)

## 2014-11-03 LAB — ABO/RH: ABO/RH(D): O POS

## 2014-11-03 MED ORDER — BISACODYL 10 MG RE SUPP
10.0000 mg | Freq: Once | RECTAL | Status: AC
  Administered 2014-11-03: 10 mg via RECTAL
  Filled 2014-11-03: qty 1

## 2014-11-03 MED ORDER — HYDROCODONE-ACETAMINOPHEN 5-325 MG PO TABS: 1.0000 | ORAL_TABLET | Freq: Four times a day (QID) | ORAL | Status: DC | PRN

## 2014-11-03 MED ORDER — CETYLPYRIDINIUM CHLORIDE 0.05 % MT LIQD
7.0000 mL | Freq: Two times a day (BID) | OROMUCOSAL | Status: DC
  Administered 2014-11-03 (×2): 7 mL via OROMUCOSAL

## 2014-11-03 NOTE — Discharge Summary (Signed)
  Date of admission: 11/02/2014  Date of discharge: 11/03/2014  Admission diagnosis: Prostate Cancer  Discharge diagnosis: Prostate Cancer  History and Physical: For full details, please see admission history and physical. Briefly, Patrick Salinas is a 64 y.o. gentleman with localized prostate cancer.  After discussing management/treatment options, he elected to proceed with surgical treatment.  Hospital Course: Patrick Salinas was taken to the operating room on 11/02/2014 and underwent a robotic assisted laparoscopic radical prostatectomy. He tolerated this procedure well and without complications. Postoperatively, he was able to be transferred to a regular hospital room following recovery from anesthesia.  He was able to begin ambulating the night of surgery. He remained hemodynamically stable overnight.  He had excellent urine output with appropriately minimal output from his pelvic drain and his pelvic drain was removed on POD #1.  He was transitioned to oral pain medication, tolerated a clear liquid diet, and had met all discharge criteria and was able to be discharged home later on POD#1.  Laboratory values:  Recent Labs  11/02/14 1457 11/03/14 0431  HGB 14.7 14.5  HCT 42.6 41.9    Disposition: Home  Discharge instruction: He was instructed to be ambulatory but to refrain from heavy lifting, strenuous activity, or driving. He was instructed on urethral catheter care.  Discharge medications:     Medication List    TAKE these medications        HYDROcodone-acetaminophen 5-325 MG per tablet  Commonly known as:  NORCO  Take 1-2 tablets by mouth every 6 (six) hours as needed.     sulfamethoxazole-trimethoprim 800-160 MG per tablet  Commonly known as:  BACTRIM DS,SEPTRA DS  Take 1 tablet by mouth 2 (two) times daily. Start the day prior to foley removal appointment        Followup: He will followup in 1 week for catheter removal and to discuss his surgical pathology results.

## 2014-11-03 NOTE — Progress Notes (Signed)
Patient ID: Patrick Salinas, male   DOB: [birth year], 64 y.o.   MRN: [id number]  1 Day Post-Op Subjective: The patient is doing well.  No nausea or vomiting. Pain is adequately controlled.  Objective: Vital signs in last 24 hours: Temp:  [97.4 F (36.3 C)-98.4 F (36.9 C)] 97.4 F (36.3 C) (08/09 0656) Pulse Rate:  [67-86] 67 (08/09 0656) Resp:  [9-18] 18 (08/09 0656) BP: (128-155)/(74-95) 128/74 mmHg (08/09 0656) SpO2:  [94 %-100 %] 98 % (08/09 0656) Weight:  [89.812 kg (198 lb)] 89.812 kg (198 lb) (08/08 1700)  Intake/Output from previous day: 08/08 0701 - 08/09 0700 In: 4827.5 [I.V.:4727.5; IV Piggyback:100] Out: 2140 [Urine:1850; Drains:90; Blood:200] Intake/Output this shift:    Physical Exam:  General: Alert and oriented. CV: RRR Lungs: Clear bilaterally. GI: Soft, Nondistended. Incisions: Clean, dry, and intact Urine: Clear Extremities: Nontender, no erythema, no edema.  Lab Results:  Recent Labs  11/02/14 1457 11/03/14 0431  HGB 14.7 14.5  HCT 42.6 41.9      Assessment/Plan: POD# 1 s/p robotic prostatectomy.  1) SL IVF 2) Ambulate, Incentive spirometry 3) Transition to oral pain medication 4) Dulcolax suppository 5) D/C pelvic drain 6) Plan for likely discharge later today   Pryor Curia. MD   LOS: 1 day   Kyrah Schiro,LES 11/03/2014, 8:04 AM

## 2015-01-25 DIAGNOSIS — C61 Malignant neoplasm of prostate: Secondary | ICD-10-CM | POA: Diagnosis not present

## 2015-01-25 DIAGNOSIS — I10 Essential (primary) hypertension: Secondary | ICD-10-CM | POA: Diagnosis not present

## 2015-01-25 DIAGNOSIS — Z23 Encounter for immunization: Secondary | ICD-10-CM | POA: Diagnosis not present

## 2015-01-25 DIAGNOSIS — M5030 Other cervical disc degeneration, unspecified cervical region: Secondary | ICD-10-CM | POA: Diagnosis not present

## 2015-05-17 DIAGNOSIS — C61 Malignant neoplasm of prostate: Secondary | ICD-10-CM | POA: Diagnosis not present

## 2015-06-18 DIAGNOSIS — N393 Stress incontinence (female) (male): Secondary | ICD-10-CM | POA: Diagnosis not present

## 2015-06-18 DIAGNOSIS — N5201 Erectile dysfunction due to arterial insufficiency: Secondary | ICD-10-CM | POA: Diagnosis not present

## 2015-06-18 DIAGNOSIS — Z0000 Encounter for general adult medical examination without abnormal findings: Secondary | ICD-10-CM | POA: Diagnosis not present

## 2015-06-18 DIAGNOSIS — C61 Malignant neoplasm of prostate: Secondary | ICD-10-CM | POA: Diagnosis not present

## 2015-07-26 DIAGNOSIS — I10 Essential (primary) hypertension: Secondary | ICD-10-CM | POA: Diagnosis not present

## 2015-11-22 DIAGNOSIS — H2513 Age-related nuclear cataract, bilateral: Secondary | ICD-10-CM | POA: Diagnosis not present

## 2016-01-10 DIAGNOSIS — I10 Essential (primary) hypertension: Secondary | ICD-10-CM | POA: Diagnosis not present

## 2016-01-10 DIAGNOSIS — M5030 Other cervical disc degeneration, unspecified cervical region: Secondary | ICD-10-CM | POA: Diagnosis not present

## 2016-01-10 DIAGNOSIS — Z23 Encounter for immunization: Secondary | ICD-10-CM | POA: Diagnosis not present

## 2016-01-10 DIAGNOSIS — E7800 Pure hypercholesterolemia, unspecified: Secondary | ICD-10-CM | POA: Diagnosis not present

## 2016-01-10 DIAGNOSIS — Z800 Family history of malignant neoplasm of digestive organs: Secondary | ICD-10-CM | POA: Diagnosis not present

## 2016-01-10 DIAGNOSIS — C61 Malignant neoplasm of prostate: Secondary | ICD-10-CM | POA: Diagnosis not present

## 2016-01-10 DIAGNOSIS — Z0000 Encounter for general adult medical examination without abnormal findings: Secondary | ICD-10-CM | POA: Diagnosis not present

## 2016-01-10 DIAGNOSIS — N529 Male erectile dysfunction, unspecified: Secondary | ICD-10-CM | POA: Diagnosis not present

## 2016-01-13 DIAGNOSIS — K648 Other hemorrhoids: Secondary | ICD-10-CM | POA: Diagnosis not present

## 2016-01-13 DIAGNOSIS — D1779 Benign lipomatous neoplasm of other sites: Secondary | ICD-10-CM | POA: Diagnosis not present

## 2016-01-13 DIAGNOSIS — Z86010 Personal history of colonic polyps: Secondary | ICD-10-CM | POA: Diagnosis not present

## 2016-01-13 DIAGNOSIS — D125 Benign neoplasm of sigmoid colon: Secondary | ICD-10-CM | POA: Diagnosis not present

## 2016-01-14 DIAGNOSIS — C61 Malignant neoplasm of prostate: Secondary | ICD-10-CM | POA: Diagnosis not present

## 2016-01-21 DIAGNOSIS — N5201 Erectile dysfunction due to arterial insufficiency: Secondary | ICD-10-CM | POA: Diagnosis not present

## 2016-01-21 DIAGNOSIS — C61 Malignant neoplasm of prostate: Secondary | ICD-10-CM | POA: Diagnosis not present

## 2016-07-21 DIAGNOSIS — C61 Malignant neoplasm of prostate: Secondary | ICD-10-CM | POA: Diagnosis not present

## 2016-07-28 DIAGNOSIS — C61 Malignant neoplasm of prostate: Secondary | ICD-10-CM | POA: Diagnosis not present

## 2016-07-28 DIAGNOSIS — N5201 Erectile dysfunction due to arterial insufficiency: Secondary | ICD-10-CM | POA: Diagnosis not present

## 2017-01-23 DIAGNOSIS — Z0000 Encounter for general adult medical examination without abnormal findings: Secondary | ICD-10-CM | POA: Diagnosis not present

## 2017-01-23 DIAGNOSIS — E7800 Pure hypercholesterolemia, unspecified: Secondary | ICD-10-CM | POA: Diagnosis not present

## 2017-01-23 DIAGNOSIS — Z800 Family history of malignant neoplasm of digestive organs: Secondary | ICD-10-CM | POA: Diagnosis not present

## 2017-01-23 DIAGNOSIS — I10 Essential (primary) hypertension: Secondary | ICD-10-CM | POA: Diagnosis not present

## 2017-01-23 DIAGNOSIS — M5030 Other cervical disc degeneration, unspecified cervical region: Secondary | ICD-10-CM | POA: Diagnosis not present

## 2017-01-23 DIAGNOSIS — R7303 Prediabetes: Secondary | ICD-10-CM | POA: Diagnosis not present

## 2017-01-23 DIAGNOSIS — C61 Malignant neoplasm of prostate: Secondary | ICD-10-CM | POA: Diagnosis not present

## 2017-01-23 DIAGNOSIS — N529 Male erectile dysfunction, unspecified: Secondary | ICD-10-CM | POA: Diagnosis not present

## 2017-01-23 DIAGNOSIS — Z23 Encounter for immunization: Secondary | ICD-10-CM | POA: Diagnosis not present

## 2017-01-23 DIAGNOSIS — Z1389 Encounter for screening for other disorder: Secondary | ICD-10-CM | POA: Diagnosis not present

## 2017-02-23 DIAGNOSIS — C61 Malignant neoplasm of prostate: Secondary | ICD-10-CM | POA: Diagnosis not present

## 2017-02-28 DIAGNOSIS — N5201 Erectile dysfunction due to arterial insufficiency: Secondary | ICD-10-CM | POA: Diagnosis not present

## 2017-02-28 DIAGNOSIS — C61 Malignant neoplasm of prostate: Secondary | ICD-10-CM | POA: Diagnosis not present

## 2017-10-03 DIAGNOSIS — C61 Malignant neoplasm of prostate: Secondary | ICD-10-CM | POA: Diagnosis not present

## 2017-10-10 DIAGNOSIS — C61 Malignant neoplasm of prostate: Secondary | ICD-10-CM | POA: Diagnosis not present

## 2017-10-10 DIAGNOSIS — N5201 Erectile dysfunction due to arterial insufficiency: Secondary | ICD-10-CM | POA: Diagnosis not present

## 2018-03-19 DIAGNOSIS — Z23 Encounter for immunization: Secondary | ICD-10-CM | POA: Diagnosis not present

## 2018-03-19 DIAGNOSIS — Z1159 Encounter for screening for other viral diseases: Secondary | ICD-10-CM | POA: Diagnosis not present

## 2018-03-19 DIAGNOSIS — Z1389 Encounter for screening for other disorder: Secondary | ICD-10-CM | POA: Diagnosis not present

## 2018-03-19 DIAGNOSIS — R7303 Prediabetes: Secondary | ICD-10-CM | POA: Diagnosis not present

## 2018-03-19 DIAGNOSIS — N529 Male erectile dysfunction, unspecified: Secondary | ICD-10-CM | POA: Diagnosis not present

## 2018-03-19 DIAGNOSIS — Z0000 Encounter for general adult medical examination without abnormal findings: Secondary | ICD-10-CM | POA: Diagnosis not present

## 2018-03-19 DIAGNOSIS — E7800 Pure hypercholesterolemia, unspecified: Secondary | ICD-10-CM | POA: Diagnosis not present

## 2018-03-19 DIAGNOSIS — I10 Essential (primary) hypertension: Secondary | ICD-10-CM | POA: Diagnosis not present

## 2018-03-19 DIAGNOSIS — Z800 Family history of malignant neoplasm of digestive organs: Secondary | ICD-10-CM | POA: Diagnosis not present

## 2018-03-19 DIAGNOSIS — M5030 Other cervical disc degeneration, unspecified cervical region: Secondary | ICD-10-CM | POA: Diagnosis not present

## 2018-03-19 DIAGNOSIS — C61 Malignant neoplasm of prostate: Secondary | ICD-10-CM | POA: Diagnosis not present

## 2018-05-17 ENCOUNTER — Other Ambulatory Visit: Payer: Self-pay

## 2018-05-17 ENCOUNTER — Inpatient Hospital Stay (HOSPITAL_COMMUNITY): Payer: Medicare Other | Admitting: Certified Registered"

## 2018-05-17 ENCOUNTER — Encounter (HOSPITAL_COMMUNITY): Payer: Self-pay | Admitting: Interventional Radiology

## 2018-05-17 ENCOUNTER — Inpatient Hospital Stay (HOSPITAL_COMMUNITY): Payer: Medicare Other

## 2018-05-17 ENCOUNTER — Other Ambulatory Visit (HOSPITAL_COMMUNITY): Payer: Medicare Other

## 2018-05-17 ENCOUNTER — Inpatient Hospital Stay (HOSPITAL_COMMUNITY)
Admission: EM | Admit: 2018-05-17 | Discharge: 2018-05-20 | DRG: 024 | Disposition: A | Discharge status: Home / Self Care | Payer: Medicare Other | Attending: Neurology | Admitting: Neurology

## 2018-05-17 ENCOUNTER — Emergency Department (HOSPITAL_COMMUNITY): Payer: Medicare Other

## 2018-05-17 ENCOUNTER — Encounter (HOSPITAL_COMMUNITY)
Admission: EM | Disposition: A | Discharge status: Home / Self Care | Payer: Self-pay | Source: Home / Self Care | Attending: Neurology

## 2018-05-17 ENCOUNTER — Inpatient Hospital Stay (HOSPITAL_COMMUNITY): Payer: PRIVATE HEALTH INSURANCE

## 2018-05-17 DIAGNOSIS — I4891 Unspecified atrial fibrillation: Secondary | ICD-10-CM | POA: Diagnosis present

## 2018-05-17 DIAGNOSIS — I361 Nonrheumatic tricuspid (valve) insufficiency: Secondary | ICD-10-CM | POA: Diagnosis not present

## 2018-05-17 DIAGNOSIS — I639 Cerebral infarction, unspecified: Secondary | ICD-10-CM

## 2018-05-17 DIAGNOSIS — E785 Hyperlipidemia, unspecified: Secondary | ICD-10-CM | POA: Diagnosis present

## 2018-05-17 DIAGNOSIS — R29706 NIHSS score 6: Secondary | ICD-10-CM | POA: Diagnosis present

## 2018-05-17 DIAGNOSIS — R29810 Facial weakness: Secondary | ICD-10-CM | POA: Diagnosis present

## 2018-05-17 DIAGNOSIS — E782 Mixed hyperlipidemia: Secondary | ICD-10-CM | POA: Diagnosis not present

## 2018-05-17 DIAGNOSIS — R4781 Slurred speech: Secondary | ICD-10-CM | POA: Diagnosis not present

## 2018-05-17 DIAGNOSIS — Z8546 Personal history of malignant neoplasm of prostate: Secondary | ICD-10-CM | POA: Diagnosis not present

## 2018-05-17 DIAGNOSIS — I63411 Cerebral infarction due to embolism of right middle cerebral artery: Principal | ICD-10-CM | POA: Diagnosis present

## 2018-05-17 DIAGNOSIS — I63511 Cerebral infarction due to unspecified occlusion or stenosis of right middle cerebral artery: Secondary | ICD-10-CM | POA: Diagnosis not present

## 2018-05-17 DIAGNOSIS — R29701 NIHSS score 1: Secondary | ICD-10-CM | POA: Diagnosis not present

## 2018-05-17 DIAGNOSIS — G8194 Hemiplegia, unspecified affecting left nondominant side: Secondary | ICD-10-CM | POA: Diagnosis present

## 2018-05-17 DIAGNOSIS — H518 Other specified disorders of binocular movement: Secondary | ICD-10-CM | POA: Diagnosis present

## 2018-05-17 DIAGNOSIS — I480 Paroxysmal atrial fibrillation: Secondary | ICD-10-CM | POA: Diagnosis present

## 2018-05-17 DIAGNOSIS — I6602 Occlusion and stenosis of left middle cerebral artery: Secondary | ICD-10-CM | POA: Diagnosis not present

## 2018-05-17 DIAGNOSIS — I10 Essential (primary) hypertension: Secondary | ICD-10-CM | POA: Diagnosis present

## 2018-05-17 DIAGNOSIS — I6601 Occlusion and stenosis of right middle cerebral artery: Secondary | ICD-10-CM | POA: Diagnosis not present

## 2018-05-17 DIAGNOSIS — I340 Nonrheumatic mitral (valve) insufficiency: Secondary | ICD-10-CM | POA: Diagnosis not present

## 2018-05-17 DIAGNOSIS — E7800 Pure hypercholesterolemia, unspecified: Secondary | ICD-10-CM | POA: Diagnosis not present

## 2018-05-17 HISTORY — PX: RADIOLOGY WITH ANESTHESIA: SHX6223

## 2018-05-17 HISTORY — PX: IR PERCUTANEOUS ART THROMBECTOMY/INFUSION INTRACRANIAL INC DIAG ANGIO: IMG6087

## 2018-05-17 HISTORY — PX: IR US GUIDE VASC ACCESS RIGHT: IMG2390

## 2018-05-17 HISTORY — PX: IR CT HEAD LTD: IMG2386

## 2018-05-17 LAB — COMPREHENSIVE METABOLIC PANEL
ALT: 21 U/L (ref 0–44)
AST: 23 U/L (ref 15–41)
Albumin: 4.1 g/dL (ref 3.5–5.0)
Alkaline Phosphatase: 61 U/L (ref 38–126)
Anion gap: 6 (ref 5–15)
BUN: 14 mg/dL (ref 8–23)
CO2: 28 mmol/L (ref 22–32)
Calcium: 9.4 mg/dL (ref 8.9–10.3)
Chloride: 105 mmol/L (ref 98–111)
Creatinine, Ser: 0.93 mg/dL (ref 0.61–1.24)
GFR calc Af Amer: >60 mL/min (ref >60)
Glucose, Bld: 60 mg/dL — ABNORMAL LOW (ref 70–99)
Potassium: 3.9 mmol/L (ref 3.5–5.1)
Sodium: 139 mmol/L (ref 135–145)
Total Bilirubin: 0.8 mg/dL (ref 0.3–1.2)
Total Protein: 6.7 g/dL (ref 6.5–8.1)

## 2018-05-17 LAB — DIFFERENTIAL
Abs Immature Granulocytes: 0.01 10*3/uL (ref 0.00–0.07)
Basophils Absolute: 0.1 10*3/uL (ref 0.0–0.1)
Basophils Relative: 1 %
Eosinophils Absolute: 0.3 10*3/uL (ref 0.0–0.5)
Eosinophils Relative: 5 %
Immature Granulocytes: 0 %
Lymphocytes Relative: 27 %
Lymphs Abs: 1.7 10*3/uL (ref 0.7–4.0)
Monocytes Absolute: 0.6 10*3/uL (ref 0.1–1.0)
Monocytes Relative: 10 %
Neutro Abs: 3.7 10*3/uL (ref 1.7–7.7)
Neutrophils Relative %: 57 %

## 2018-05-17 LAB — CBC
HCT: 49.2 % (ref 39.0–52.0)
Hemoglobin: 16 g/dL (ref 13.0–17.0)
MCH: 29.7 pg (ref 26.0–34.0)
MCHC: 32.5 g/dL (ref 30.0–36.0)
MCV: 91.3 fL (ref 80.0–100.0)
Platelets: 246 10*3/uL (ref 150–400)
RBC: 5.39 MIL/uL (ref 4.22–5.81)
RDW: 13 % (ref 11.5–15.5)
WBC: 6.4 10*3/uL (ref 4.0–10.5)
nRBC: 0 % (ref 0.0–0.2)

## 2018-05-17 LAB — APTT: aPTT: 27 seconds (ref 24–36)

## 2018-05-17 LAB — MRSA PCR SCREENING: MRSA by PCR: NEGATIVE

## 2018-05-17 LAB — PROTIME-INR
INR: 1.06
Prothrombin Time: 13.7 seconds (ref 11.4–15.2)

## 2018-05-17 LAB — I-STAT CREATININE, ED: Creatinine, Ser: 0.9 mg/dL (ref 0.61–1.24)

## 2018-05-17 LAB — CBG MONITORING, ED
Glucose-Capillary: 113 mg/dL — ABNORMAL HIGH (ref 70–99)
Glucose-Capillary: 46 mg/dL — ABNORMAL LOW (ref 70–99)

## 2018-05-17 SURGERY — IR WITH ANESTHESIA
Anesthesia: General

## 2018-05-17 SURGERY — RADIOLOGY WITH ANESTHESIA
Anesthesia: General

## 2018-05-17 MED ORDER — DILTIAZEM HCL-DEXTROSE 100-5 MG/100ML-% IV SOLN (PREMIX): 5.0000 mg/h | INTRAVENOUS | Status: DC

## 2018-05-17 MED ORDER — SODIUM CHLORIDE 0.9 % IV SOLN
INTRAVENOUS | Status: DC | PRN
  Administered 2018-05-17: 15 ug/min via INTRAVENOUS

## 2018-05-17 MED ORDER — ESMOLOL HCL 100 MG/10ML IV SOLN
INTRAVENOUS | Status: DC | PRN
  Administered 2018-05-17: 20 mg via INTRAVENOUS

## 2018-05-17 MED ORDER — NICARDIPINE HCL IN NACL 20-0.86 MG/200ML-% IV SOLN
INTRAVENOUS | Status: AC
  Filled 2018-05-17: qty 200

## 2018-05-17 MED ORDER — CLEVIDIPINE BUTYRATE 0.5 MG/ML IV EMUL: 0.0000 mg/h | INTRAVENOUS | Status: DC

## 2018-05-17 MED ORDER — LABETALOL HCL 5 MG/ML IV SOLN
INTRAVENOUS | Status: AC
  Filled 2018-05-17: qty 4

## 2018-05-17 MED ORDER — IOPAMIDOL (ISOVUE-300) INJECTION 61%
INTRAVENOUS | Status: AC
  Administered 2018-05-17: 5 mL
  Filled 2018-05-17: qty 50

## 2018-05-17 MED ORDER — NITROGLYCERIN 1 MG/10 ML FOR IR/CATH LAB
INTRA_ARTERIAL | Status: AC
  Filled 2018-05-17: qty 10

## 2018-05-17 MED ORDER — IOHEXOL 300 MG/ML  SOLN
150.0000 mL | Freq: Once | INTRAMUSCULAR | Status: AC | PRN
  Administered 2018-05-17: 40 mL via INTRA_ARTERIAL

## 2018-05-17 MED ORDER — DILTIAZEM HCL 30 MG PO TABS
30.0000 mg | ORAL_TABLET | Freq: Four times a day (QID) | ORAL | Status: DC
  Administered 2018-05-17 – 2018-05-19 (×6): 30 mg via ORAL
  Filled 2018-05-17 (×6): qty 1

## 2018-05-17 MED ORDER — ACETAMINOPHEN 650 MG RE SUPP: 650.0000 mg | RECTAL | Status: DC | PRN

## 2018-05-17 MED ORDER — TICAGRELOR 90 MG PO TABS
ORAL_TABLET | ORAL | Status: AC
  Filled 2018-05-17: qty 2

## 2018-05-17 MED ORDER — CEFAZOLIN SODIUM-DEXTROSE 2-4 GM/100ML-% IV SOLN
INTRAVENOUS | Status: AC
  Filled 2018-05-17: qty 100

## 2018-05-17 MED ORDER — IOHEXOL 300 MG/ML  SOLN
150.0000 mL | Freq: Once | INTRAMUSCULAR | Status: AC | PRN
  Administered 2018-05-17: 50 mL via INTRA_ARTERIAL

## 2018-05-17 MED ORDER — TIROFIBAN HCL IN NACL 5-0.9 MG/100ML-% IV SOLN
INTRAVENOUS | Status: AC
  Filled 2018-05-17: qty 100

## 2018-05-17 MED ORDER — LIDOCAINE HCL 1 % IJ SOLN
INTRAMUSCULAR | Status: AC
  Filled 2018-05-17: qty 20

## 2018-05-17 MED ORDER — LABETALOL HCL 5 MG/ML IV SOLN
5.0000 mg | Freq: Once | INTRAVENOUS | Status: AC
  Administered 2018-05-17: 5 mg via INTRAVENOUS

## 2018-05-17 MED ORDER — LABETALOL HCL 5 MG/ML IV SOLN: 20.0000 mg | Freq: Once | INTRAVENOUS | Status: DC

## 2018-05-17 MED ORDER — DILTIAZEM HCL-DEXTROSE 100-5 MG/100ML-% IV SOLN (PREMIX)
5.0000 mg/h | INTRAVENOUS | Status: DC
  Administered 2018-05-17: 5 mg/h via INTRAVENOUS
  Filled 2018-05-17: qty 100

## 2018-05-17 MED ORDER — CEFAZOLIN SODIUM-DEXTROSE 2-3 GM-%(50ML) IV SOLR
INTRAVENOUS | Status: DC | PRN
  Administered 2018-05-17: 2 g via INTRAVENOUS

## 2018-05-17 MED ORDER — SODIUM CHLORIDE 0.9% FLUSH: 3.0000 mL | Freq: Once | INTRAVENOUS | Status: DC

## 2018-05-17 MED ORDER — CLOPIDOGREL BISULFATE 300 MG PO TABS
ORAL_TABLET | ORAL | Status: AC
  Filled 2018-05-17: qty 1

## 2018-05-17 MED ORDER — ASPIRIN 325 MG PO TABS
ORAL_TABLET | ORAL | Status: AC
  Filled 2018-05-17: qty 1

## 2018-05-17 MED ORDER — ONDANSETRON HCL 4 MG/2ML IJ SOLN
INTRAMUSCULAR | Status: DC | PRN
  Administered 2018-05-17: 4 mg via INTRAVENOUS

## 2018-05-17 MED ORDER — PHENYLEPHRINE 40 MCG/ML (10ML) SYRINGE FOR IV PUSH (FOR BLOOD PRESSURE SUPPORT)
PREFILLED_SYRINGE | INTRAVENOUS | Status: DC | PRN
  Administered 2018-05-17 (×7): 80 ug via INTRAVENOUS

## 2018-05-17 MED ORDER — ALTEPLASE (STROKE) FULL DOSE INFUSION
0.9000 mg/kg | Freq: Once | INTRAVENOUS | Status: AC
  Administered 2018-05-17: 86.4 mg via INTRAVENOUS
  Filled 2018-05-17: qty 100

## 2018-05-17 MED ORDER — LABETALOL HCL 5 MG/ML IV SOLN
INTRAVENOUS | Status: AC | PRN
  Administered 2018-05-17: 10 mg via INTRAVENOUS

## 2018-05-17 MED ORDER — SENNOSIDES-DOCUSATE SODIUM 8.6-50 MG PO TABS: 1.0000 | ORAL_TABLET | Freq: Every evening | ORAL | Status: DC | PRN

## 2018-05-17 MED ORDER — SODIUM CHLORIDE 0.9 % IV SOLN
INTRAVENOUS | Status: DC | PRN
  Administered 2018-05-17 (×2): via INTRAVENOUS

## 2018-05-17 MED ORDER — LABETALOL HCL 5 MG/ML IV SOLN
10.0000 mg | Freq: Once | INTRAVENOUS | Status: AC
  Administered 2018-05-17: 10 mg via INTRAVENOUS

## 2018-05-17 MED ORDER — ACETAMINOPHEN 160 MG/5ML PO SOLN: 650.0000 mg | ORAL | Status: DC | PRN

## 2018-05-17 MED ORDER — PANTOPRAZOLE SODIUM 40 MG IV SOLR
40.0000 mg | Freq: Every day | INTRAVENOUS | Status: DC
  Administered 2018-05-17: 40 mg via INTRAVENOUS
  Filled 2018-05-17: qty 40

## 2018-05-17 MED ORDER — CLEVIDIPINE BUTYRATE 0.5 MG/ML IV EMUL
0.0000 mg/h | INTRAVENOUS | Status: DC
  Administered 2018-05-17: 1 mg/h via INTRAVENOUS

## 2018-05-17 MED ORDER — SUGAMMADEX SODIUM 200 MG/2ML IV SOLN
INTRAVENOUS | Status: DC | PRN
  Administered 2018-05-17: 200 mg via INTRAVENOUS

## 2018-05-17 MED ORDER — STROKE: EARLY STAGES OF RECOVERY BOOK
Freq: Once | Status: AC
  Administered 2018-05-18: 1
  Filled 2018-05-17: qty 1

## 2018-05-17 MED ORDER — PHENYLEPHRINE HCL-NACL 10-0.9 MG/250ML-% IV SOLN
0.0000 ug/min | INTRAVENOUS | Status: DC
  Administered 2018-05-17 – 2018-05-18 (×2): 20 ug/min via INTRAVENOUS
  Filled 2018-05-17 (×2): qty 250

## 2018-05-17 MED ORDER — SUCCINYLCHOLINE CHLORIDE 200 MG/10ML IV SOSY
PREFILLED_SYRINGE | INTRAVENOUS | Status: DC | PRN
  Administered 2018-05-17: 80 mg via INTRAVENOUS

## 2018-05-17 MED ORDER — LIDOCAINE 2% (20 MG/ML) 5 ML SYRINGE
INTRAMUSCULAR | Status: DC | PRN
  Administered 2018-05-17: 60 mg via INTRAVENOUS

## 2018-05-17 MED ORDER — ACETAMINOPHEN 325 MG PO TABS: 650.0000 mg | ORAL_TABLET | ORAL | Status: DC | PRN

## 2018-05-17 MED ORDER — DEXAMETHASONE SODIUM PHOSPHATE 10 MG/ML IJ SOLN
INTRAMUSCULAR | Status: DC | PRN
  Administered 2018-05-17: 4 mg via INTRAVENOUS

## 2018-05-17 MED ORDER — EPTIFIBATIDE 20 MG/10ML IV SOLN
INTRAVENOUS | Status: AC
  Filled 2018-05-17: qty 10

## 2018-05-17 MED ORDER — NICARDIPINE HCL IN NACL 20-0.86 MG/200ML-% IV SOLN
0.0000 mg/h | INTRAVENOUS | Status: DC
  Administered 2018-05-17: 5 mg/h via INTRAVENOUS
  Filled 2018-05-17: qty 200

## 2018-05-17 MED ORDER — SODIUM CHLORIDE 0.9 % IV SOLN
INTRAVENOUS | Status: DC
  Administered 2018-05-17 – 2018-05-18 (×2): via INTRAVENOUS

## 2018-05-17 MED ORDER — IOPAMIDOL (ISOVUE-370) INJECTION 76%
75.0000 mL | Freq: Once | INTRAVENOUS | Status: AC | PRN
  Administered 2018-05-17: 75 mL via INTRAVENOUS

## 2018-05-17 MED ORDER — FENTANYL CITRATE (PF) 250 MCG/5ML IJ SOLN
INTRAMUSCULAR | Status: DC | PRN
  Administered 2018-05-17: 100 ug via INTRAVENOUS

## 2018-05-17 MED ORDER — LABETALOL HCL 5 MG/ML IV SOLN
INTRAVENOUS | Status: DC | PRN
  Administered 2018-05-17: 5 mg via INTRAVENOUS
  Administered 2018-05-17: 2.5 mg via INTRAVENOUS
  Administered 2018-05-17: 5 mg via INTRAVENOUS

## 2018-05-17 MED ORDER — FENTANYL CITRATE (PF) 100 MCG/2ML IJ SOLN
INTRAMUSCULAR | Status: AC
  Filled 2018-05-17: qty 4

## 2018-05-17 MED ORDER — DILTIAZEM HCL 60 MG PO TABS: 30.0000 mg | ORAL_TABLET | Freq: Two times a day (BID) | ORAL | Status: DC

## 2018-05-17 MED ORDER — PROPOFOL 10 MG/ML IV BOLUS
INTRAVENOUS | Status: DC | PRN
  Administered 2018-05-17: 120 mg via INTRAVENOUS

## 2018-05-17 MED ORDER — ROCURONIUM BROMIDE 10 MG/ML (PF) SYRINGE
PREFILLED_SYRINGE | INTRAVENOUS | Status: DC | PRN
  Administered 2018-05-17: 10 mg via INTRAVENOUS
  Administered 2018-05-17: 50 mg via INTRAVENOUS
  Administered 2018-05-17: 20 mg via INTRAVENOUS
  Administered 2018-05-17: 10 mg via INTRAVENOUS

## 2018-05-17 NOTE — Progress Notes (Signed)
Discussed case with Cardiology. Based of HR of 80 in the setting of newly diagnosed atrial fibrillation, will continue Cardizem IV at a rate of 5 and will transition to PO Cardizem at 30 mg q6h.   Electronically signed: Dr. Kerney Elbe

## 2018-05-17 NOTE — Code Documentation (Signed)
68yo male arriving to [facility] via [place] at 1336. Patient from home where he was doing his taxes with his wife when at 7 he was noted to develop left sided deficits. EMS was called and activated a code stroke for left sided weakness. Of note, patient in new onset atrial fibrillation. Stroke team at the bedside on patient arrival. Labs drawn and patient cleared for CT by EDP. Patient to CT with team. NIHSS 6, see documentation for details and code stroke times. Patient with left facial droop, left arm and leg drift and ataxia, and dysarthria on exam. tPA administered per pharmacy dosing at 1354. CTA head and neck completed. Patient transferred to the ED. Patient with elevated DBP requiring Labetalol 10mg  IVP x 2 followed by Cleviprex gtt per MD. Patient with improvement in neuro exam with NIHSS 1 for mild left facial droop. CTA showing acute occlusion distal right M1 segment extending into the right M2 segments. Decision to not proceed with endovascular intervention due to patient's clinical improvement. Patient to be admitted to ICU. Bedside handoff with ED RN Gerald Stabs.   Of note, checked on patient around 1515 and patient had worsening of symptoms. Patient transported to CT with 4N RN and then to IR. Handoff with IR and anesthesia teams at the bedside.

## 2018-05-17 NOTE — Progress Notes (Signed)
Report given at bedside to [facility] PACU and report given on 4N to [facility].

## 2018-05-17 NOTE — H&P (Addendum)
NEURO HOSPITALIST  H&P      Chief Complaint: right gaze/ left side weakness  History obtained from:  Patient / EMS  HPI:                                                                                                                                         Patrick Salinas is an 68 y.o. male  With PMH HLD, HTN, prostate cancer who  Presented to [facility] ED as a code stroke for c/o left side weakness and gaze deviation.  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 Was at home with his wife ( a retired Lexicographer) while they were talking  She noticed and at about 1310 he had a sudden onset of left side flaccid, facial droop and right gaze. He began leaning to the left. She called 911. Denies any blood thinners. No prior stroke history.  ED course:  CTH: no hemorrhage. CTA: occlusion of distal right M1   Date last known well: 05-17-2018 Time last known well: 1310 tPA Given: Yes; started @ 1354 Modified Rankin: Rankin Score=0 NIHSS:6     Past Medical History:  Diagnosis Date  . Cancer    Prostate cancer -dx 5'16  . Hypercholesterolemia    no meds due to adverse reaction    Past Surgical History:  Procedure Laterality Date  . BIOPSY PROSTATE     5'16  . CERVICAL FUSION    . COLONOSCOPY W/ POLYPECTOMY     x2 benign  . ROBOT ASSISTED LAPAROSCOPIC RADICAL PROSTATECTOMY N/A 11/02/2014   Procedure: ROBOTIC ASSISTED LAPAROSCOPIC RADICAL PROSTATECTOMY LEVEL 1;  Surgeon: Raynelle Bring, MD;  Location: [facility] ORS;  Service: Urology;  Laterality: N/A;  . TONSILLECTOMY      No family history on file.      Social History:  reports that he has never smoked. He does not have any smokeless tobacco history on file. He reports current alcohol use. He reports that he does not use drugs.  Allergies: No Known Allergies  Medications:  Current Facility-Administered Medications  Medication Dose Route Frequency Provider Last Rate Last Dose  . iopamidol (ISOVUE-370) 76 % injection 75 mL  75 mL Intravenous Once PRN Greta Doom, MD      . labetalol (NORMODYNE,TRANDATE) 5 MG/ML injection           . sodium chloride flush (NS) 0.9 % injection 3 mL  3 mL Intravenous Once Veryl Speak, MD       Current Outpatient Medications  Medication Sig Dispense Refill  . HYDROcodone-acetaminophen (NORCO) 5-325 MG per tablet Take 1-2 tablets by mouth every 6 (six) hours as needed. 30 tablet 0  . sulfamethoxazole-trimethoprim (BACTRIM DS,SEPTRA DS) 800-160 MG per tablet Take 1 tablet by mouth 2 (two) times daily. Start the day prior to foley removal appointment 6 tablet 0     ROS:                                                                                                                                       ROS was performed and is negative except as noted in HPI  General Examination:                                                                                                      There were no vitals taken for this visit.  HEENT-  Normocephalic, no lesions, without obvious abnormality.  Normal external eye and conjunctiva. Cardiovascular- S1-S2 audible, pulses palpable throughout  Lungs-no rhonchi or wheezing noted, no excessive working breathing.  Saturations within normal limits on RA Abdomen- All 4 quadrants palpated and non tender Extremities- Warm, dry and intact Musculoskeletal-no joint tenderness, deformity or swelling Skin-warm and dry, intact  Neurological Examination Mental Status: Alert, oriented, thought content appropriate.  Speech fluent without evidence of aphasia.  Able to follow commands without difficulty. Cranial Nerves: II:  Visual fields grossly normal,  III,IV, VI: ptosis not present, extra-ocular motions intact bilaterally, pupils equal, round, reactive to light and accommodation V,VII:  smile asymmetric,left facial droop,  facial light touch sensation normal bilaterally VIII: hearing normal bilaterally IX,X: uvula rises midline XI: bilateral shoulder shrug XII: midline tongue extension Motor: Right : Upper extremity   5/5  Left:     Upper extremity   4/5  Lower extremity   5/5   Lower extremity   3/5 Tone and bulk:normal tone throughout; no atrophy noted Sensory:  light touch intact throughout, bilaterally Plantars: Right: downgoing   Left: downgoing Cerebellar: normal finger-to-nose on right. Left side  with ataxia ,  Left HTS ataxic.  Gait: deferred   Lab Results: Basic Metabolic Panel: Recent Labs  Lab 05/17/18 1353  CREATININE 0.90    CBC: Recent Labs  Lab 05/17/18 1340  WBC 6.4  NEUTROABS 3.7  HGB 16.0  HCT 49.2  MCV 91.3  PLT 246     CBG: Recent Labs  Lab 05/17/18 1339 05/17/18 1342  GLUCAP 46* 113*    Imaging: Ct Angio Head W Or Wo Contrast  Result Date: 05/17/2018 CLINICAL DATA:  Stroke.  Left-sided weakness.  Atrial fibrillation. EXAM: CT ANGIOGRAPHY HEAD AND NECK TECHNIQUE: Multidetector CT imaging of the head and neck was performed using the standard protocol during bolus administration of intravenous contrast. Multiplanar CT image reconstructions and MIPs were obtained to evaluate the vascular anatomy. Carotid stenosis measurements (when applicable) are obtained utilizing NASCET criteria, using the distal internal carotid diameter as the denominator. CONTRAST:  18mL ISOVUE-370 IOPAMIDOL (ISOVUE-370) INJECTION 76% COMPARISON:  CT head 05/17/2018 FINDINGS: CTA NECK FINDINGS Aortic arch: Standard branching. Imaged portion shows no evidence of aneurysm or dissection. No significant stenosis of the major arch vessel origins. Right carotid system: Widely patent without stenosis. Left carotid system: Widely patent without stenosis. Vertebral arteries: Both vertebral arteries widely patent without stenosis. Skeleton: ACDF with solid fusion C4-5. No  acute skeletal abnormality. Other neck: Negative Upper chest: Negative Review of the MIP images confirms the above findings CTA HEAD FINDINGS Anterior circulation: Right cavernous carotid widely patent. Right anterior cerebral artery widely patent. Abrupt occlusion distal right M1 segment with clot in the distal right M1 segment and proximal M2 segments compatible with embolus. This corresponds to hyperdense right MCA on CT. Embolus extends into the inferior division right MCA with flow distal to the occlusion due to collaterals. There is also embolus in the superior division of the right MCA. Cavernous carotid widely patent. Anterior and middle cerebral arteries widely patent. Posterior circulation: Both vertebral arteries patent to the basilar. PICA patent bilaterally. Basilar widely patent. Superior cerebellar and posterior cerebral arteries patent bilaterally without stenosis. Patent posterior communicating artery bilaterally. Venous sinuses: Not well visualized due to arterial phase Anatomic variants: None Delayed phase: Not performed Review of the MIP images confirms the above findings IMPRESSION: 1. Acute occlusion distal right M1 segment extending into the right M2 segments. This is compatible with embolus given the history of atrial fibrillation. There is flow distal to the embolus due to collateral circulation. No other intracranial occlusion. 2. No significant carotid or vertebral artery stenosis in the neck 3. These results were called by telephone at the time of interpretation on 05/17/2018 at 2:12 pm to Dr. Leonel Ramsay , who verbally acknowledged these results. Electronically Signed   By: Franchot Gallo M.D.   On: 05/17/2018 14:13   Ct Angio Neck W Or Wo Contrast  Result Date: 05/17/2018 CLINICAL DATA:  Stroke.  Left-sided weakness.  Atrial fibrillation. EXAM: CT ANGIOGRAPHY HEAD AND NECK TECHNIQUE: Multidetector CT imaging of the head and neck was performed using the standard protocol during bolus  administration of intravenous contrast. Multiplanar CT image reconstructions and MIPs were obtained to evaluate the vascular anatomy. Carotid stenosis measurements (when applicable) are obtained utilizing NASCET criteria, using the distal internal carotid diameter as the denominator. CONTRAST:  90mL ISOVUE-370 IOPAMIDOL (ISOVUE-370) INJECTION 76% COMPARISON:  CT head 05/17/2018 FINDINGS: CTA NECK FINDINGS Aortic arch: Standard branching. Imaged portion shows no evidence of aneurysm or dissection. No significant stenosis of the major arch vessel origins. Right carotid system: Widely  patent without stenosis. Left carotid system: Widely patent without stenosis. Vertebral arteries: Both vertebral arteries widely patent without stenosis. Skeleton: ACDF with solid fusion C4-5. No acute skeletal abnormality. Other neck: Negative Upper chest: Negative Review of the MIP images confirms the above findings CTA HEAD FINDINGS Anterior circulation: Right cavernous carotid widely patent. Right anterior cerebral artery widely patent. Abrupt occlusion distal right M1 segment with clot in the distal right M1 segment and proximal M2 segments compatible with embolus. This corresponds to hyperdense right MCA on CT. Embolus extends into the inferior division right MCA with flow distal to the occlusion due to collaterals. There is also embolus in the superior division of the right MCA. Cavernous carotid widely patent. Anterior and middle cerebral arteries widely patent. Posterior circulation: Both vertebral arteries patent to the basilar. PICA patent bilaterally. Basilar widely patent. Superior cerebellar and posterior cerebral arteries patent bilaterally without stenosis. Patent posterior communicating artery bilaterally. Venous sinuses: Not well visualized due to arterial phase Anatomic variants: None Delayed phase: Not performed Review of the MIP images confirms the above findings IMPRESSION: 1. Acute occlusion distal right M1 segment  extending into the right M2 segments. This is compatible with embolus given the history of atrial fibrillation. There is flow distal to the embolus due to collateral circulation. No other intracranial occlusion. 2. No significant carotid or vertebral artery stenosis in the neck 3. These results were called by telephone at the time of interpretation on 05/17/2018 at 2:12 pm to Dr. Leonel Ramsay , who verbally acknowledged these results. Electronically Signed   By: Franchot Gallo M.D.   On: 05/17/2018 14:13   Ct Head Code Stroke Wo Contrast  Result Date: 05/17/2018 CLINICAL DATA:  Code stroke.  Left-sided flaccid.  Facial droop EXAM: CT HEAD WITHOUT CONTRAST TECHNIQUE: Contiguous axial images were obtained from the base of the skull through the vertex without intravenous contrast. COMPARISON:  None. FINDINGS: Brain: Image quality degraded by motion. Ventricle size normal. Negative for hemorrhage or mass. No evidence of acute infarct however sensitivity is decreased due to motion. Vascular: Hyperdense right middle cerebral artery distal right M1 and M2 segments. This is best seen on coronal and sagittal images and is difficult to see on the axial images due to motion. Skull: Negative Sinuses/Orbits: Mild mucosal edema paranasal sinuses. Negative orbit Other: None ASPECTS ([place] Stroke Program Early CT Score) - Ganglionic level infarction (caudate, lentiform nuclei, internal capsule, insula, M1-M3 cortex): 7 - Supraganglionic infarction (M4-M6 cortex): 3 Total score (0-10 with 10 being normal): 10 IMPRESSION: 1. Hyperdense right MCA suggestive of acute thrombosis. Negative for hemorrhage. No CT evidence of acute infarct however sensitivity is diminished due to considerable motion. 2. ASPECTS is 10 3. These results were called by telephone at the time of interpretation on 05/17/2018 at 1:56 pm to Dr. Leonel Ramsay , who verbally acknowledged these results. Electronically Signed   By: Franchot Gallo M.D.   On: 05/17/2018  13:57       Laurey Morale, MSN, NP-C [facility] [phone]  05/17/2018, 1:49 PM   Attending physician note to follow with Assessment and plan .  I have seen the patient and reviewed the above note.  He presented with left-sided hemiparesis consistent with acute stroke was given IV TPA.  CTA demonstrated M2 occlusion initially following TPA he markedly improved to an NIH of 1 (though even the facial droop markedly improved, and from looking at his epic picture, I think he may have a slightly asymmetric smile at baseline).  Unfortunately, after  being admitted to the ICU he had a precipitous decline and I again accompanied him back down to CT which did not demonstrate any hemorrhage.  He was therefore taken for emergent thrombectomy.  Assessment: 68 y.o. male with right M2 occlusion likely secondary to new onset atrial fibrillation.  He is undergone mechanical thrombectomy and is now being admitted to the neuro intensive care unit.   Plan: -- BP goal : Permissive HTN up to 180/117mmHg,  --MRI Brain  --CTA --Echocardiogram -- ASA -- High intensity Statin if LDL > 70 -- HgbA1c, fasting lipid panel -- PT consult, OT consult, Speech consult --Telemetry monitoring --Frequent neuro checks --Stroke swallow screen  CNS -Close neuro monitoring  RESP No acute issues  CV Essential (primary) hypertension -BP 120-140 -Diltiazem for BP control as well as rate control  GI/GU -Gentle hydration   HEME -Monitor -transfuse for hgb < 7  ENDO -goal HgbA1c < 7  ID Possible Aspiration PNA -CXR -Monitor  Prophylaxis DVT: SCD's only!!! GI: doc/senna  Dispo: TBD  Diet: NPO until cleared by speech or bedside swallow eval  Code Status: Full Code   --please page stroke NP  Or  PA  Or MD from 8am -4 pm  as this patient from this time will be  followed by the stroke.   You can look them up on [email or web address]  Password [email or web address]   This patient is critically ill and at  significant risk of neurological worsening, death and care requires constant monitoring of vital signs, hemodynamics,respiratory and cardiac monitoring, neurological assessment, discussion with family, other specialists and medical decision making of high complexity. I spent 90 minutes of neurocritical care time  in the care of  this patient. This was time spent independent of any time provided by nurse practitioner or PA.  Roland Rack, MD [facility] [phone]  If 7pm- 7am, please page neurology on call as listed in [place]. 05/17/2018  5:42 PM

## 2018-05-17 NOTE — Progress Notes (Signed)
Anesthesia present for case. Chrys Racer CRNA, Dr. Ermalene Postin present for TO

## 2018-05-17 NOTE — ED Triage Notes (Signed)
Per EMs talking to wife and slumped in chair and had left sided deficits.  AOx4 LSN 1310

## 2018-05-17 NOTE — Transfer of Care (Signed)
Immediate Anesthesia Transfer of Care Note  Patient: Patrick Salinas  Procedure(s) Performed: IR WITH ANESTHESIA (N/A )  Patient Location: PACU  Anesthesia Type:General  Level of Consciousness: awake, alert  and oriented  Airway & Oxygen Therapy: Patient Spontanous Breathing and Patient connected to face mask oxygen  Post-op Assessment: Report given to RN and Post -op Vital signs reviewed and stable  Post vital signs: Reviewed and stable  Last Vitals:  Vitals Value Taken Time  BP 139/109 05/17/2018  5:48 PM  Temp    Pulse 87 05/17/2018  5:51 PM  Resp 18 05/17/2018  5:51 PM  SpO2 99 % 05/17/2018  5:51 PM  Vitals shown include unvalidated device data.  Last Pain:  Vitals:   05/17/18 1407  TempSrc: Oral  PainSc:      Report to Lovena Le RN - patient has no residual deficits, alert and oriented x 4, ability to move all limbs on command without weakness or drift. BP managed with PRN labetalol, cardizem gtt restarted for HR control per Ermalene Postin MD.    Complications: No apparent anesthesia complications

## 2018-05-17 NOTE — Significant Event (Signed)
Sudden onset left sided hemiplegia and facial droop noted at 1515, NIHSS increase from 1 to 6.  Dr. Leonel Ramsay immediately at bedside to assess. Stat CT scan obtained, pt taken to IR per neuro.

## 2018-05-17 NOTE — Anesthesia Procedure Notes (Signed)
Arterial Line Insertion Start/End2/21/2020 3:40 PM, 05/17/2018 3:45 PM Performed by: Oleta Mouse, MD, Milford Cage, CRNA, CRNA  Patient location: Pre-op. Preanesthetic checklist: patient identified, IV checked, site marked, risks and benefits discussed, surgical consent, monitors and equipment checked, pre-op evaluation, timeout performed and anesthesia consent Lidocaine 1% used for infiltration Left, radial was placed Catheter size: 20 G Hand hygiene performed , maximum sterile barriers used  and Seldinger technique used  Attempts: 1 Procedure performed without using ultrasound guided technique. Following insertion, dressing applied. Post procedure assessment: normal and unchanged  Patient tolerated the procedure well with no immediate complications.

## 2018-05-17 NOTE — Progress Notes (Signed)
Pharmacist Code Stroke Response  Notified to mix tPA at 1349 by Dr. Leonel Ramsay Delivered tPA to RN at 1353    Issues/delays encountered (if applicable): N/A  Patrick Salinas 05/17/18 2:04 PM

## 2018-05-17 NOTE — ED Provider Notes (Signed)
Pt seen by me at bridge.  Pt is awake and talking.  Dr. Leonel Ramsay at bedside to assess.  Pt  clear for Ct    Patrick Salinas 05/17/18 1412    Veryl Speak, MD 05/17/18 [phone]

## 2018-05-17 NOTE — Anesthesia Procedure Notes (Signed)
Procedure Name: Intubation Date/Time: 05/17/2018 3:46 PM Performed by: Jearld Pies, CRNA Pre-anesthesia Checklist: Patient identified, Emergency Drugs available, Suction available and Patient being monitored Patient Re-evaluated:Patient Re-evaluated prior to induction Oxygen Delivery Method: Circle System Utilized Preoxygenation: Pre-oxygenation with 100% oxygen Induction Type: IV induction, Rapid sequence and Cricoid Pressure applied Laryngoscope Size: Glidescope and 4 Grade View: Grade II Tube type: Oral Tube size: 7.5 mm Number of attempts: 1 Airway Equipment and Method: Stylet and Oral airway Placement Confirmation: ETT inserted through vocal cords under direct vision,  positive ETCO2 and breath sounds checked- equal and bilateral Secured at: 23 cm Tube secured with: Tape Dental Injury: Teeth and Oropharynx as per pre-operative assessment

## 2018-05-17 NOTE — Anesthesia Preprocedure Evaluation (Addendum)
Anesthesia Evaluation  Patient identified by MRN, date of birth, ID band Patient awake    Reviewed: Allergy & Precautions, Patient's Chart, lab work & pertinent test results, Unable to perform ROS - Chart review onlyPreop documentation limited or incomplete due to emergent nature of procedure.  History of Anesthesia Complications Negative for: history of anesthetic complications  Airway Mallampati: III  TM Distance: >3 FB Neck ROM: Limited    Dental  (+) Teeth Intact   Pulmonary neg pulmonary ROS,    breath sounds clear to auscultation       Cardiovascular + dysrhythmias Atrial Fibrillation  Rhythm:Regular     Neuro/Psych CVA    GI/Hepatic negative GI ROS, Neg liver ROS,   Endo/Other  negative endocrine ROS  Renal/GU negative Renal ROS     Musculoskeletal   Abdominal   Peds  Hematology negative hematology ROS (+)   Anesthesia Other Findings   Reproductive/Obstetrics                             Anesthesia Physical Anesthesia Plan  ASA: III and emergent  Anesthesia Plan: General   Post-op Pain Management:    Induction: Intravenous, Rapid sequence and Cricoid pressure planned  PONV Risk Score and Plan:   Airway Management Planned: Oral ETT  Additional Equipment: Arterial line  Intra-op Plan:   Post-operative Plan: Possible Post-op intubation/ventilation  Informed Consent:     Only emergency history available  Plan Discussed with: CRNA and Surgeon  Anesthesia Plan Comments:        Anesthesia Quick Evaluation

## 2018-05-17 NOTE — Procedures (Signed)
Neuro-Interventional Radiology  Post Cerebral Angiogram Procedure Note  History:   68 yo male with right MCA m1 occlusion.    Baseline mRS:  0 NIHSS:   6 ASPECTS:   10 Site of occlusion:  Right m1   Skin Puncture:   3:49pm First Pass Date & Time: 16:09  Device:  Solitaire 4x40, CAT 5 local aspiration Second Pass Date & Time: 16:21  Device:  Solitaire 4x40, CAT5 local aspiration Third Pass Date & Time: 16:39  Device:  Embotrap 5x35, ACE 64 local aspiration IV tPA administered?: yes IA Medication:  none  Final mTICI Score   TICI-3     Anesthesia    GETA  Procedure:  - US guided R CFA access - Cerebral Angiogram - Mechanical thrombectomy of ELVO involving right MCA - Deployment of Angioseal for hemostasis - Flat panel CT in NIR suite - Extubated in NIR  Findings:  Right m1 occlusion, with TICI 3 at end of procedure after 3 passes  Complications: None  EBL: ~100cc  Recommendations: - right hip straight overnight - Goal SBP 120-140.   - Frequent NV checks - Repeat CT or MRI imaging recommended within 36 hours, discretion of Neurology - NIR to follow   Signed,  Dulcy Fanny. Earleen Newport, DO

## 2018-05-17 NOTE — Progress Notes (Signed)
NeuroInterventional Radiology  Pre-Procedure Note  History: Patrick Salinas is a 68 yo presenting as Code Stroke to [facility] with EMS.   He has acute onset ~1pm, witnessed by his wife.  They are both physicians. His son is a Agricultural consultant. She called 911.    Patient had no hemorrhage on CT head on presentation, with NIHSS of 6 before tPA administration.  As NIR was speaking with the family his NIHSS dropped to 1.  After some discussion in the room with Stroke Neurology and the family, we decided to admit and observe.   Within an hour, he worsened, and repeat CT was performed.   At this time, we elected to proceed with angiogram and thrombectomy.    Baseline mRS:  NIHSS:  6  CT ASPECTS: 10 CTA:   Right M1 occlusion CTP:   none   Given the patient's symptoms, imaging findings, baseline function, I believe they are an appropriate candidate for mechanical thrombectomy.    The risks and benefits of the procedure were discussed with the patient/patient's family, with specific risks including: bleeding, infection, arterial injury/dissection, contrast reaction, kidney injury, need for further procedure/surgery, neurologic deficit, 10-15% risk of intracranial hemorrhage, cardiopulmonary collapse, death. All questions were answered.  The patient/family would like to proceed with attempt at thrombectomy.   Plan for cerebral angiogram and attempt at mechanical thrombectomy.   Signed,   Dulcy Fanny. Earleen Newport, DO

## 2018-05-18 ENCOUNTER — Inpatient Hospital Stay (HOSPITAL_COMMUNITY): Payer: Medicare Other

## 2018-05-18 ENCOUNTER — Other Ambulatory Visit (HOSPITAL_COMMUNITY): Payer: Medicare Other

## 2018-05-18 DIAGNOSIS — I10 Essential (primary) hypertension: Secondary | ICD-10-CM

## 2018-05-18 DIAGNOSIS — I63411 Cerebral infarction due to embolism of right middle cerebral artery: Principal | ICD-10-CM

## 2018-05-18 DIAGNOSIS — I480 Paroxysmal atrial fibrillation: Secondary | ICD-10-CM

## 2018-05-18 DIAGNOSIS — E7800 Pure hypercholesterolemia, unspecified: Secondary | ICD-10-CM

## 2018-05-18 DIAGNOSIS — I63511 Cerebral infarction due to unspecified occlusion or stenosis of right middle cerebral artery: Secondary | ICD-10-CM

## 2018-05-18 DIAGNOSIS — I4891 Unspecified atrial fibrillation: Secondary | ICD-10-CM

## 2018-05-18 LAB — BASIC METABOLIC PANEL
Anion gap: 12 (ref 5–15)
BUN: 12 mg/dL (ref 8–23)
CO2: 20 mmol/L — ABNORMAL LOW (ref 22–32)
Calcium: 8.5 mg/dL — ABNORMAL LOW (ref 8.9–10.3)
Chloride: 106 mmol/L (ref 98–111)
Creatinine, Ser: 1 mg/dL (ref 0.61–1.24)
GFR calc Af Amer: >60 mL/min (ref >60)
GFR calc non Af Amer: >60 mL/min (ref >60)
Glucose, Bld: 162 mg/dL — ABNORMAL HIGH (ref 70–99)
Potassium: 4 mmol/L (ref 3.5–5.1)
Sodium: 138 mmol/L (ref 135–145)

## 2018-05-18 LAB — HEMOGLOBIN A1C
Hgb A1c MFr Bld: 5.7 % — ABNORMAL HIGH (ref 4.8–5.6)
Mean Plasma Glucose: 116.89 mg/dL

## 2018-05-18 LAB — CBC
HCT: 43.5 % (ref 39.0–52.0)
Hemoglobin: 14.6 g/dL (ref 13.0–17.0)
MCH: 30.6 pg (ref 26.0–34.0)
MCHC: 33.6 g/dL (ref 30.0–36.0)
MCV: 91.2 fL (ref 80.0–100.0)
PLATELETS: 242 10*3/uL (ref 150–400)
RBC: 4.77 MIL/uL (ref 4.22–5.81)
RDW: 13.2 % (ref 11.5–15.5)
WBC: 9.8 10*3/uL (ref 4.0–10.5)
nRBC: 0 % (ref 0.0–0.2)

## 2018-05-18 LAB — LIPID PANEL
Cholesterol: 217 mg/dL — ABNORMAL HIGH (ref 0–200)
HDL: 50 mg/dL (ref >40)
LDL Cholesterol: 154 mg/dL — ABNORMAL HIGH (ref 0–99)
Total CHOL/HDL Ratio: 4.3 RATIO
Triglycerides: 66 mg/dL (ref <150)
VLDL: 13 mg/dL (ref 0–40)

## 2018-05-18 LAB — HIV ANTIBODY (ROUTINE TESTING W REFLEX): HIV Screen 4th Generation wRfx: NONREACTIVE

## 2018-05-18 MED ORDER — PANTOPRAZOLE SODIUM 40 MG PO TBEC
40.0000 mg | DELAYED_RELEASE_TABLET | Freq: Every day | ORAL | Status: DC
  Administered 2018-05-18 – 2018-05-20 (×3): 40 mg via ORAL
  Filled 2018-05-18 (×4): qty 1

## 2018-05-18 MED ORDER — PRAVASTATIN SODIUM 10 MG PO TABS: 20.0000 mg | ORAL_TABLET | Freq: Every day | ORAL | Status: DC

## 2018-05-18 MED ORDER — APIXABAN 5 MG PO TABS: 5.0000 mg | ORAL_TABLET | Freq: Two times a day (BID) | ORAL | Status: DC

## 2018-05-18 MED ORDER — ROSUVASTATIN CALCIUM 20 MG PO TABS
20.0000 mg | ORAL_TABLET | Freq: Every day | ORAL | Status: DC
  Administered 2018-05-18 – 2018-05-19 (×2): 20 mg via ORAL
  Filled 2018-05-18 (×2): qty 1

## 2018-05-18 MED ORDER — ASPIRIN EC 325 MG PO TBEC
325.0000 mg | DELAYED_RELEASE_TABLET | Freq: Once | ORAL | Status: AC
  Administered 2018-05-18: 325 mg via ORAL
  Filled 2018-05-18: qty 1

## 2018-05-18 NOTE — Evaluation (Signed)
Speech Language Pathology Evaluation Patient Details Name: Patrick Salinas MRN: [id number] DOB: Feb 19, 1951 Today's Date: 05/18/2018 Time: 8144-8185 SLP Time Calculation (min) (ACUTE ONLY): 20 min  Problem List:  Patient Active Problem List   Diagnosis Date Noted  . Stroke ([place]) 05/17/2018  . Prostate cancer ([place]) 11/02/2014   Past Medical History:  Past Medical History:  Diagnosis Date  . [facility])    Prostate cancer -dx 5'16  . Hypercholesterolemia    no meds due to adverse reaction   Past Surgical History:  Past Surgical History:  Procedure Laterality Date  . BIOPSY PROSTATE     5'16  . CERVICAL FUSION    . COLONOSCOPY W/ POLYPECTOMY     x2 benign  . IR CT HEAD LTD  05/17/2018  . IR PERCUTANEOUS ART THROMBECTOMY/INFUSION INTRACRANIAL INC DIAG ANGIO  05/17/2018  . IR US GUIDE VASC ACCESS RIGHT  05/17/2018  . ROBOT ASSISTED LAPAROSCOPIC RADICAL PROSTATECTOMY N/A 11/02/2014   Procedure: ROBOTIC ASSISTED LAPAROSCOPIC RADICAL PROSTATECTOMY LEVEL 1;  Surgeon: Raynelle Bring, MD;  Location: [facility] ORS;  Service: Urology;  Laterality: N/A;  . TONSILLECTOMY     HPI:  Patient is a 68 y.o. male with PMH of HLD, HTN, prostate cancer who presented to [facility] ED as a code stroke for c/o left side weakness and gaze deviation.  CT showed right inferior temporal lobe infarct due to right distal M1 occlusion. He was administered TPA and underwent mechanical thrombectomy of right M1 occlusion.    Assessment / Plan / Recommendation Clinical Impression  Patient presents WNL for cognition, speech and language abilities with no deficits observed. Patient is a retired MD and was able to easily discuss complex medical information related to his own care (testing, medications, etc.). He did not exhbiit any facial or lingual assymetry, no dysarthria or motor planning deficits. Speech intelligibilty was 100% at conversational level. Patient is oriented x4 and did not exhibit any deficits in areas of problem solving  or reasoning. Spouse stated that patient appears to be at baseline and she hasn't noticed any abnormalities.     SLP Assessment  SLP Recommendation/Assessment: Patient does not need any further Speech Lanaguage Pathology Services SLP Visit Diagnosis: Cognitive communication deficit (R41.841)    Follow Up Recommendations  None    Frequency and Duration     N/A      SLP Evaluation Cognition  Overall Cognitive Status: Within Functional Limits for tasks assessed Orientation Level: Oriented X4 Attention: Alternating;Divided Alternating Attention: Appears intact Divided Attention: Appears intact Memory: Appears intact Awareness: Appears intact Problem Solving: Appears intact Executive Function: Reasoning Reasoning: Appears intact Safety/Judgment: Appears intact       Comprehension  Auditory Comprehension Overall Auditory Comprehension: Appears within functional limits for tasks assessed    Expression Expression Primary Mode of Expression: Verbal Verbal Expression Overall Verbal Expression: Appears within functional limits for tasks assessed Written Expression Dominant Hand: Right   Oral / Motor  Oral Motor/Sensory Function Overall Oral Motor/Sensory Function: Within functional limits Motor Speech Overall Motor Speech: Appears within functional limits for tasks assessed   [place], [place], CCC-SLP 05/18/18 5:24 PM

## 2018-05-18 NOTE — Evaluation (Signed)
Occupational Therapy Evaluation Patient Details Name: Patrick Salinas MRN: [id number] DOB: 07-29-1950 Today's Date: 05/18/2018    History of Present Illness 68 y.o. male  With PMH HLD, HTN, prostate cancer who  Presented to [facility] ED as a code stroke for c/o left side weakness and gaze deviation.  CT showed right inferior temporal lobe infarct due to right distal M1 occlusion. He was administered TPA and underwent mechanical thrombectomy of right M1 occlusion.    Clinical Impression   PTA patient independent and driving, very active.  Admitted for above and presents at baseline function for self care, functional mobility and transfers (provided supervision for safety and line mgmt only).  Strength, sensation and coordination at at baseline, vision appears [facility] (noted slight inconsistencies with R upper quadrant initially but improved with increased lighting in room), and cognition appears WFL (Short blessed test cognitive screen scoring 0/28--normal).  VSS throughout session.  At this time, no further OT needs have been identified and OT signing off.      Follow Up Recommendations  No OT follow up    Equipment Recommendations  None recommended by OT    Recommendations for Other Services       Precautions / Restrictions Precautions Precautions: None Restrictions Weight Bearing Restrictions: No      Mobility Bed Mobility Overal bed mobility: Modified Independent Bed Mobility: Supine to Sit;Sit to Supine     Supine to sit: Supervision;HOB elevated Sit to supine: Supervision;HOB elevated   General bed mobility comments: no assist required  Transfers Overall transfer level: Needs assistance Equipment used: None Transfers: Sit to/from Stand Sit to Stand: Supervision         General transfer comment: no assist required, supervision for line mgmt     Balance Overall balance assessment: No apparent balance deficits (not formally assessed)                                          ADL either performed or assessed with clinical judgement   ADL Overall ADL's : Modified independent                                             Vision Baseline Vision/History: Wears glasses Wears Glasses: At all times Patient Visual Report: No change from baseline Vision Assessment?: Yes Eye Alignment: Within Functional Limits Ocular Range of Motion: Within Functional Limits Alignment/Gaze Preference: Within Defined Limits Tracking/Visual Pursuits: Able to track stimulus in all quads without difficulty Visual Fields: No apparent deficits(initally inconsistent R upper quad, lighting improved) Additional Comments: appears functionally normal     Perception     Praxis      Pertinent Vitals/Pain Pain Assessment: No/denies pain     Hand Dominance Right   Extremity/Trunk Assessment Upper Extremity Assessment Upper Extremity Assessment: Overall WFL for tasks assessed   Lower Extremity Assessment Lower Extremity Assessment: Defer to PT evaluation   Cervical / Trunk Assessment Cervical / Trunk Assessment: Normal   Communication Communication Communication: No difficulties   Cognition Arousal/Alertness: Awake/alert Behavior During Therapy: WFL for tasks assessed/performed Overall Cognitive Status: Within Functional Limits for tasks assessed  General Comments: Scored 0/28 on short blessed test, good dual cog task    General Comments  VSS throughout session, reviewed energy conservation techniques     Exercises     Shoulder Instructions      Home Living Family/patient expects to be discharged to:: Private residence Living Arrangements: Spouse/significant other Available Help at Discharge: Family;Available 24 hours/day Type of Home: House Home Access: Stairs to enter [employer] of Steps: 1 (threshold)   Home Layout: Able to live on main level with bedroom/bathroom      Bathroom Shower/Tub: Occupational psychologist: Handicapped height Bathroom Accessibility: Yes   Home Equipment: None          Prior Functioning/Environment Level of Independence: Independent        Comments: highly active[place]        OT Problem List:        OT Treatment/Interventions:      OT Goals(Current goals can be found in the care plan section) Acute Rehab OT Goals Patient Stated Goal: "get back to my list of projects" OT Goal Formulation: With patient  OT Frequency:     Barriers to D/C:            Co-evaluation              AM-PAC OT "6 Clicks" Daily Activity     Outcome Measure Help from another person eating meals?: None Help from another person taking care of personal grooming?: None Help from another person toileting, which includes using toliet, bedpan, or urinal?: None Help from another person bathing (including washing, rinsing, drying)?: None Help from another person to put on and taking off regular upper body clothing?: None Help from another person to put on and taking off regular lower body clothing?: None 6 Click Score: 24   End of Session Nurse Communication: Mobility status  Activity Tolerance: Patient tolerated treatment well Patient left: in bed;with call bell/phone within reach;with family/visitor present;Other (comment)(ST entering room)  OT Visit Diagnosis: Other abnormalities of gait and mobility (R26.89)                Time: 1829-9371 OT Time Calculation (min): 27 min Charges:  OT General Charges $OT Visit: 1 Visit OT Evaluation $OT Eval Low Complexity: 1 Low OT Treatments $Self Care/Home Management : 8-22 mins  Delight Stare, OT Acute Rehabilitation Services Pager [phone] Office [phone]   Delight Stare 05/18/2018, 4:42 PM

## 2018-05-18 NOTE — Progress Notes (Signed)
STROKE TEAM PROGRESS NOTE   SUBJECTIVE (INTERVAL HISTORY) His wife and son are at the bedside.  Pt is neuro intact this time. He stated that he has no hx of afib and this time diagnosis of afib was new. He is still in afib this am, he discussed with Dr. Marlou Porch who will see him today. He had intolerance with lipitor in the past, will do pravastatin this time as his LDL still high.    OBJECTIVE Vitals:   05/18/18 0615 05/18/18 0630 05/18/18 0700 05/18/18 0800  BP:   122/82 118/89  Pulse: 89 97 93 92  Resp: (!) 22 18 15 20   Temp:      TempSrc:      SpO2: 98% 97% 97% 94%  Weight:      Height:        CBC:  Recent Labs  Lab 05/17/18 1340 05/18/18 0617  WBC 6.4 9.8  NEUTROABS 3.7  --   HGB 16.0 14.6  HCT 49.2 43.5  MCV 91.3 91.2  PLT 246 269    Basic Metabolic Panel:  Recent Labs  Lab 05/17/18 1340 05/17/18 1353 05/18/18 0617  NA 139  --  138  K 3.9  --  4.0  CL 105  --  106  CO2 28  --  20*  GLUCOSE 60*  --  162*  BUN 14  --  12  CREATININE 0.93 0.90 1.00  CALCIUM 9.4  --  8.5*    Lipid Panel:     Component Value Date/Time   CHOL 217 (H) 05/18/2018 0617   TRIG 66 05/18/2018 0617   HDL 50 05/18/2018 0617   CHOLHDL 4.3 05/18/2018 0617   VLDL 13 05/18/2018 0617   LDLCALC 154 (H) 05/18/2018 0617   HgbA1c:  Lab Results  Component Value Date   HGBA1C 5.7 (H) 05/18/2018   Urine Drug Screen: No results found for: LABOPIA, COCAINSCRNUR, LABBENZ, AMPHETMU, THCU, LABBARB  Alcohol Level No results found for: ETH    IMAGING  Ct Angio Head W Or Wo Contrast Ct Angio Neck W Or Wo Contrast 05/17/2018 IMPRESSION:  1. Acute occlusion distal right M1 segment extending into the right M2 segments. This is compatible with embolus given the history of atrial fibrillation. There is flow distal to the embolus due to collateral circulation. No other intracranial occlusion.  2. No significant carotid or vertebral artery stenosis in the neck    Ct Head Wo  Contrast 05/17/2018 IMPRESSION:  Subtle area of hypodensity in the right inferior temporal lobe appears new and likely represents an area of acute infarct. Negative for hemorrhage.   [place] 05/17/2018 IMPRESSION:  Status post ultrasound-guided access of right common femoral artery for cervical and cerebral angiogram, with mechanical thrombectomy of right M1 occlusion and restoration of TICI 3 flow after 3 passes. Deployment of Angio-Seal for hemostasis.  PLAN:  Right hip straight overnight Target blood pressure 485-462 systolic Frequent neurovascular checks. Admission to ICU. Further neuroimaging at the discretion of neurology  Electronically Signed   By: Corrie Mckusick D.O.   On: 05/17/2018 18:03    Ct Head Code Stroke Wo Contrast 05/17/2018 IMPRESSION:  1. Hyperdense right MCA suggestive of acute thrombosis. Negative for hemorrhage. No CT evidence of acute infarct however sensitivity is diminished due to considerable motion.  2. ASPECTS is 10    Transthoracic Echocardiogram  00/00/2020 Pending  EKG - afib   PHYSICAL EXAM  Temp:  [97.7 F (36.5 C)-98.5 F (36.9 C)] 98.5 F (  36.9 C) (02/22 0400) Pulse Rate:  [30-115] 99 (02/22 0900) Resp:  [13-32] 18 (02/22 0900) BP: (101-169)/(60-125) 126/81 (02/22 0900) SpO2:  [85 %-100 %] 96 % (02/22 0900) Arterial Line BP: (107-159)/(58-86) 131/73 (02/22 0800) Weight:  [96 kg-96.2 kg] 96.2 kg (02/21 1404)  General - Well nourished, well developed, in no apparent distress.  Ophthalmologic - fundi not visualized due to noncooperation.  Cardiovascular - irregularly irregular heart rate and rhythm, afib RVR on tele  Mental Status -  Level of arousal and orientation to time, place, and person were intact. Language including expression, naming, repetition, comprehension was assessed and found intact. Fund of Knowledge was assessed and was intact.  Cranial Nerves II - XII - II - Visual field intact OU. III, IV, VI - Extraocular  movements intact. V - Facial sensation intact bilaterally. VII - Facial movement intact bilaterally. VIII - Hearing & vestibular intact bilaterally X - Palate elevates symmetrically. XI - Chin turning & shoulder shrug intact bilaterally. XII - Tongue protrusion intact.  Motor Strength - The patient's strength was normal in all extremities and pronator drift was absent.  Bulk was normal and fasciculations were absent.   Motor Tone - Muscle tone was assessed at the neck and appendages and was normal.  Reflexes - The patient's reflexes were symmetrical in all extremities and he had no pathological reflexes.  Sensory - Light touch, temperature/pinprick were assessed and were symmetrical.    Coordination - The patient had normal movements in the hands and feet with no ataxia or dysmetria.  Tremor was absent.  Gait and Station - deferred.    ASSESSMENT/PLAN Mr. MASARU CHAMBERLIN is a 68 y.o. male with history of HLD, HTN, prostate cancer  presenting with left side weakness and right gaze deviation. Improved in ED. tPA Given @ 2542 Friday 05/17/2018 Mechanical thrombectomy of right M1 occlusion and restoration of TICI 3 flow after 3 passes.  Stroke:  right inferior temporal lobe infarct due to right distal M1 occlusion s/p tPA and IR with TICI3 reperfusion - embolic pattern - likely due to new diagnosis of atrial fibrillation  Resultant  Neuro intact now  CT head - hyperdense right MCA suggestive of acute thrombosis.   MRI head - pending  MRA head - pending  CTA H&N - Acute occlusion distal right M1 segment extending into the right M2 segments.  2D Echo - pending  LDL - 154  HgbA1c - 5.7  VTE prophylaxis - SCDs  Diet  - Heart healthy with thin liquids.  No antithrombotic prior to admission, now on No antithrombotic within 24h of tPA. Will consider DOAC if MRI does not show large infarct  Patient counseled to be compliant with his antithrombotic medications  Ongoing aggressive  stroke risk factor management  Therapy recommendations:  pending  Disposition:  Pending  Afib RVR, new diagnosis  EKG on presentation showed afib  On cardizem now  Dr. Marlou Porch aware and will be consulted today  Consider DOAC if MRI does not show large infarct  Hypertension  Stable  Off cardene or neo  Will d/c A line  BP goal 120-140 post procedure . Long-term BP goal normotensive  Hyperlipidemia  Lipid lowering medication PTA: none  LDL 154, goal < 70  Intolerance to statin in the past  Current lipid lowering medication: pravastatin 20  Consider add coQ10 if possible  Continue statin at discharge  Other Stroke Risk Factors  Advanced age  ETOH use, advised to drink no more than 1  alcoholic beverage per day.  Other Active Problems  Prostate cancer  Elevated TG  Hospital day # 1  This patient is critically ill due to right MCA infarct, afib RVR, s/p tPA and IR and at significant risk of neurological worsening, death form recurrent stroke, hemorrhagic conversion, heart failure, seizure. This patient's care requires constant monitoring of vital signs, hemodynamics, respiratory and cardiac monitoring, review of multiple databases, neurological assessment, discussion with family, other specialists and medical decision making of high complexity. I spent 40 minutes of neurocritical care time in the care of this patient. I had long discussion with pt and wife at bedside, updated pt current condition, treatment plan and potential prognosis. They expressed understanding and appreciation.   Rosalin Hawking, MD PhD Stroke Neurology 05/18/2018 9:42 AM    To contact Stroke Continuity provider, please refer to [email or web address] After hours, contact General Neurology

## 2018-05-18 NOTE — Progress Notes (Signed)
Referring Physician(s): Dr Lavera Guise  Supervising Physician: Corrie Mckusick  Patient Status:  [facility] - In-pt  Chief Complaint:  CVA R MCA revascularization  Subjective:  Doing well Moving all 4s Speech and vision without change   Allergies: Patient has no known allergies.  Medications: Prior to Admission medications   Medication Sig Start Date End Date Taking? Authorizing Provider  HYDROcodone-acetaminophen (NORCO) 5-325 MG per tablet Take 1-2 tablets by mouth every 6 (six) hours as needed. 11/02/14   Debbrah Alar, PA-C  sulfamethoxazole-trimethoprim (BACTRIM DS,SEPTRA DS) 800-160 MG per tablet Take 1 tablet by mouth 2 (two) times daily. Start the day prior to foley removal appointment 11/02/14   Debbrah Alar, PA-C     Vital Signs: BP 126/81   Pulse 99   Temp 98.5 F (36.9 C) (Oral)   Resp 18   Ht 6\' 3"  (1.905 m)   Wt 212 lb 1.3 oz (96.2 kg)   SpO2 96%   BMI 26.51 kg/m   Physical Exam Vitals signs reviewed.  Eyes:     Extraocular Movements: Extraocular movements intact.  Neck:     Musculoskeletal: Normal range of motion.  Cardiovascular:     Rate and Rhythm: Regular rhythm.     Heart sounds: Normal heart sounds.  Abdominal:     General: Bowel sounds are normal.     Palpations: Abdomen is soft.  Musculoskeletal: Normal range of motion.     Comments: Good strength and sensation  Skin:    General: Skin is warm and dry.     Comments: Right groin NT no bleeding No hematoma Rt foot- 2+ pulses  Neurological:     Mental Status: He is alert and oriented to person, place, and time.  Psychiatric:        Behavior: Behavior normal.     Imaging: Ct Angio Head W Or Wo Contrast  Result Date: 05/17/2018 CLINICAL DATA:  Stroke.  Left-sided weakness.  Atrial fibrillation. EXAM: CT ANGIOGRAPHY HEAD AND NECK TECHNIQUE: Multidetector CT imaging of the head and neck was performed using the standard protocol during bolus administration of intravenous contrast. Multiplanar CT  image reconstructions and MIPs were obtained to evaluate the vascular anatomy. Carotid stenosis measurements (when applicable) are obtained utilizing NASCET criteria, using the distal internal carotid diameter as the denominator. CONTRAST:  57mL ISOVUE-370 IOPAMIDOL (ISOVUE-370) INJECTION 76% COMPARISON:  CT head 05/17/2018 FINDINGS: CTA NECK FINDINGS Aortic arch: Standard branching. Imaged portion shows no evidence of aneurysm or dissection. No significant stenosis of the major arch vessel origins. Right carotid system: Widely patent without stenosis. Left carotid system: Widely patent without stenosis. Vertebral arteries: Both vertebral arteries widely patent without stenosis. Skeleton: ACDF with solid fusion C4-5. No acute skeletal abnormality. Other neck: Negative Upper chest: Negative Review of the MIP images confirms the above findings CTA HEAD FINDINGS Anterior circulation: Right cavernous carotid widely patent. Right anterior cerebral artery widely patent. Abrupt occlusion distal right M1 segment with clot in the distal right M1 segment and proximal M2 segments compatible with embolus. This corresponds to hyperdense right MCA on CT. Embolus extends into the inferior division right MCA with flow distal to the occlusion due to collaterals. There is also embolus in the superior division of the right MCA. Cavernous carotid widely patent. Anterior and middle cerebral arteries widely patent. Posterior circulation: Both vertebral arteries patent to the basilar. PICA patent bilaterally. Basilar widely patent. Superior cerebellar and posterior cerebral arteries patent bilaterally without stenosis. Patent posterior communicating artery bilaterally. Venous sinuses:  Not well visualized due to arterial phase Anatomic variants: None Delayed phase: Not performed Review of the MIP images confirms the above findings IMPRESSION: 1. Acute occlusion distal right M1 segment extending into the right M2 segments. This is  compatible with embolus given the history of atrial fibrillation. There is flow distal to the embolus due to collateral circulation. No other intracranial occlusion. 2. No significant carotid or vertebral artery stenosis in the neck 3. These results were called by telephone at the time of interpretation on 05/17/2018 at 2:12 pm to Dr. Leonel Ramsay , who verbally acknowledged these results. Electronically Signed   By: Franchot Gallo M.D.   On: 05/17/2018 14:13   Ct Head Wo Contrast  Result Date: 05/17/2018 CLINICAL DATA:  Stroke. Post tPA. Worsening neurologic examination. EXAM: CT HEAD WITHOUT CONTRAST TECHNIQUE: Contiguous axial images were obtained from the base of the skull through the vertex without intravenous contrast. COMPARISON:  CT head 05/17/2018 FINDINGS: Brain: Ill-defined hypodensity in the right inferior temporal lobe. Given the prior right MCA occlusion, this likely represents acute infarct. This is not visualized on the CT from earlier today however the prior study was degraded by motion. Never less, this does appear to be a new finding. No associated hemorrhage. Ventricle size remains normal. Vascular: Vascular contrast due to CTA Skull: Negative Sinuses/Orbits: Mild mucosal edema paranasal sinuses. Other: None IMPRESSION: Subtle area of hypodensity in the right inferior temporal lobe appears new and likely represents an area of acute infarct. Negative for hemorrhage. Images were reviewed with Dr. Leonel Ramsay at 1540 hours. Electronically Signed   By: Franchot Gallo M.D.   On: 05/17/2018 16:10   Ct Angio Neck W Or Wo Contrast  Result Date: 05/17/2018 CLINICAL DATA:  Stroke.  Left-sided weakness.  Atrial fibrillation. EXAM: CT ANGIOGRAPHY HEAD AND NECK TECHNIQUE: Multidetector CT imaging of the head and neck was performed using the standard protocol during bolus administration of intravenous contrast. Multiplanar CT image reconstructions and MIPs were obtained to evaluate the vascular anatomy.  Carotid stenosis measurements (when applicable) are obtained utilizing NASCET criteria, using the distal internal carotid diameter as the denominator. CONTRAST:  59mL ISOVUE-370 IOPAMIDOL (ISOVUE-370) INJECTION 76% COMPARISON:  CT head 05/17/2018 FINDINGS: CTA NECK FINDINGS Aortic arch: Standard branching. Imaged portion shows no evidence of aneurysm or dissection. No significant stenosis of the major arch vessel origins. Right carotid system: Widely patent without stenosis. Left carotid system: Widely patent without stenosis. Vertebral arteries: Both vertebral arteries widely patent without stenosis. Skeleton: ACDF with solid fusion C4-5. No acute skeletal abnormality. Other neck: Negative Upper chest: Negative Review of the MIP images confirms the above findings CTA HEAD FINDINGS Anterior circulation: Right cavernous carotid widely patent. Right anterior cerebral artery widely patent. Abrupt occlusion distal right M1 segment with clot in the distal right M1 segment and proximal M2 segments compatible with embolus. This corresponds to hyperdense right MCA on CT. Embolus extends into the inferior division right MCA with flow distal to the occlusion due to collaterals. There is also embolus in the superior division of the right MCA. Cavernous carotid widely patent. Anterior and middle cerebral arteries widely patent. Posterior circulation: Both vertebral arteries patent to the basilar. PICA patent bilaterally. Basilar widely patent. Superior cerebellar and posterior cerebral arteries patent bilaterally without stenosis. Patent posterior communicating artery bilaterally. Venous sinuses: Not well visualized due to arterial phase Anatomic variants: None Delayed phase: Not performed Review of the MIP images confirms the above findings IMPRESSION: 1. Acute occlusion distal right M1 segment extending  into the right M2 segments. This is compatible with embolus given the history of atrial fibrillation. There is flow distal  to the embolus due to collateral circulation. No other intracranial occlusion. 2. No significant carotid or vertebral artery stenosis in the neck 3. These results were called by telephone at the time of interpretation on 05/17/2018 at 2:12 pm to Dr. Leonel Ramsay , who verbally acknowledged these results. Electronically Signed   By: Franchot Gallo M.D.   On: 05/17/2018 14:13   [place]  Result Date: 05/17/2018 INDICATION: 68 year old male with a history of right-sided M1 occlusion, emergent large vessel occlusion EXAM: ULTRASOUND-GUIDED RIGHT COMMON FEMORAL ARTERY ACCESS CERVICAL AND CEREBRAL ANGIOGRAM MECHANICAL THROMBECTOMY RIGHT MCA COMPARISON:  CT imaging same date MEDICATIONS: 2.0 g Ancef. The antibiotic was administered within 1 hour of the procedure ANESTHESIA/SEDATION: General endotracheal tube anesthesia The patient was continuously monitored during the procedure by the interventional radiology nurse under my direct supervision. CONTRAST:  90 cc FLUOROSCOPY TIME:  Fluoroscopy Time: 38 minutes 48 seconds (2,063 mGy). COMPLICATIONS: None TECHNIQUE: Informed written consent was obtained from the patient's family after a thorough discussion of the procedural risks, benefits and alternatives. Specific risks discussed include: Bleeding, infection, contrast reaction, kidney injury/failure, need for further procedure/surgery, arterial injury or dissection, embolization to new territory, intracranial hemorrhage (10-15% risk), neurologic deterioration, cardiopulmonary collapse, death. All questions were addressed. Maximal Sterile Barrier Technique was utilized including during the procedure including caps, mask, sterile gowns, sterile gloves, sterile drape, hand hygiene and skin antiseptic. A timeout was performed prior to the initiation of the procedure. The anesthesia team was present to provide general endotracheal tube anesthesia and for patient monitoring during the procedure. Interventional neuro  radiology nursing staff was also present. FINDINGS: Initial Findings: Right common carotid artery:  Normal course caliber and contour. Right external carotid artery: Patent with antegrade flow. Right internal carotid artery: Normal course caliber and contour of the cervical portion. Vertical and petrous segment patent with normal course caliber contour. Cavernous segment patent. Clinoid segment patent. Antegrade flow of the ophthalmic artery. Ophthalmic segment patent. Terminus patent. Right MCA:  Right M1 occlusion Right ACA: A 1 segment patent. A 2 segment perfuses the right territory. Completion Findings: Right MCA: Complete restoration of flow through the right MCA TICI 3 Flat panel CT: No hemorrhage PROCEDURE: Patient is brought emergently to the neuro angiography suite, with the patient identified appropriately and placed supine position on the table. Left radial arterial line was placed by the anesthesia team. The patient is then prepped and draped in the usual sterile fashion. Ultrasound survey of the right inguinal region was performed with images stored and sent to PACs. 11 blade scalpel was used to make a small incision. Blunt dissection was performed. A micropuncture needle was used access the right common femoral artery under ultrasound. With excellent arterial blood flow returned, an .018 micro wire was passed through the needle, observed to enter the abdominal aorta under fluoroscopy. The needle was removed, and a micropuncture sheath was placed over the wire. The inner dilator and wire were removed, and an 035 Bentson wire was advanced under fluoroscopy into the abdominal aorta. The sheath was removed and a standard 5 [place] vascular sheath was placed. The dilator was removed and the sheath was flushed. A 23F JB-1 diagnostic catheter was advanced over the wire to the proximal descending thoracic aorta. Wire was then removed. Double flush of the catheter was performed. Catheter was then used to select  the right common  carotid artery. Catheter was advanced over the wire into the cervical ICA. Wire was removed. Double flush was performed. Formal angiogram was performed, with roadmap achieved. Exchange length Rosen wire was then passed through the diagnostic catheter to the distal cervical ICA and the diagnostic catheter was removed. The 5 French sheath was removed and exchanged for 8 French 55 centimeter BrightTip sheath. Sheath was flushed and attached to pressurized and heparinized saline bag for constant forward flow. Eight French 95 cm flow gate balloon catheter was then advanced over the wire to the distal cervical segment. Wire was removed. Then a coaxial intermediate catheter and microcatheter combination was prepared on the back table. This combination was CAT-5 catheter and a Trevo Provue18 microcatheter, with a synchro soft wire. This combination was then advanced through the balloon guide into the ICA. System was advanced into the internal carotid artery, to the level of the occlusion. The micro wire was then carefully advanced through the occluded segment. Microcatheter was then pushed through the occluded segment and the wire was removed. Intermediate catheter was advanced over the micro wire to the M1 segment. Blood was then aspirated through the hub of the microcatheter, and a gentle contrast injection was performed confirming intraluminal position. A rotating hemostatic valve was then attached to the back end of the microcatheter, and a pressurized and heparinized saline bag was attached to the catheter. 4 x 40 solitaire device was then selected. Back flush was achieved at the rotating hemostatic valve, and then the device was gently advanced through the microcatheter to the distal end. The retriever was then unsheathed by withdrawing the microcatheter under fluoroscopy. Once the retriever was completely unsheathed, the microcatheter was carefully stripped from the delivery wire of the device.  Control angiogram was then performed from the balloon catheter. 3 minute time interval was observed. The balloon on the balloon guide was then inflated to profile of the vessel. Constant aspiration was then performed through the intermediate catheter, and constant gentle aspiration was performed at the balloon guide. This aspiration was continued as the retriever was gently and slowly withdrawn with fluoroscopic observation into the distal intermediate catheter. The entire system was then gently withdrawn from the intracranial ICA and into the balloon guide. Once the retriever was entirely removed from the system, free aspiration was confirmed at the hub of the balloon guide, with free blood return confirmed. Control angiogram was then performed. Persistent occlusion at the proximal MCA was confirmed. The microcatheter system was then advanced through the intermediate catheter. Once the micro wire microcatheter were beyond the occlusion, the micro wire was removed and stentreiver device was deployed, stripping the microcatheter from the wire. After the device was deployed across the occlusion, the intermediate catheter was placed at the site of occlusion. The balloon at the balloon guide was inflated to profile of the vessel. Local aspiration was performed at the intermediate catheter upon withdrawal of the device under fluoroscopic observation, with aspiration at the balloon guide. Once the retrieve was withdrawn to the face of the intermediate catheter, both the microcatheter in intermediate catheter were removed from the system. Free aspiration was confirmed at the hub of the balloon guide catheter, with free blood return confirmed. Control angiogram was again performed. Persistent occlusion was identified. We proceeded with a third attempt. The microcatheter system was then advanced through a new intermediate catheter, using Ace 64 catheter. Once the micro wire microcatheter, and intermediate catheter were  advanced to the carotid siphon, the microwire and microcatheter were advanced beyond  the occlusion. The wire was removed and a new device was deployed, using EMBO trap 5 x 33, stripping the microcatheter from the wire. After the device was deployed across the occlusion, the Ace catheter was placed at the site of occlusion. The balloon at the balloon guide was inflated to profile of the vessel. Local aspiration was performed at the intermediate catheter upon withdrawal of the device under fluoroscopic observation, with aspiration at the balloon guide. Once the retrieve her was entirely removed from the system, free aspiration was confirmed at the hub of the intermediate catheter, with free blood return confirmed. Balloon on the balloon guide was deflated. Control angiogram was performed. Additional views were performed with multiple obliquities. TI CI 3 was confirmed. Balloon guide was removed, and the bright tip sheath was removed. Eight [place] Angio-Seal was deployed. Flat panel CT was performed. This was reviewed and the patient was extubated. Patient tolerated the procedure well and remained hemodynamically stable throughout. No complications were encountered. Estimated blood loss approximately 100 cc. IMPRESSION: Status post ultrasound-guided access of right common femoral artery for cervical and cerebral angiogram, with mechanical thrombectomy of right M1 occlusion and restoration of TICI 3 flow after 3 passes. Deployment of Angio-Seal for hemostasis. Signed, Dulcy Fanny. Dellia Nims, RPVI [facility] Radiology PLAN: Right hip straight overnight Target blood pressure 1 33-825 systolic Frequent neurovascular checks. Admission to ICU. Further neuroimaging at the discretion of neurology Electronically Signed   By: Corrie Mckusick D.O.   On: 05/17/2018 18:03   Ir US Guide Vasc Access Right  Result Date: 05/17/2018 INDICATION: 68 year old male with a history of  right-sided M1 occlusion, emergent large vessel occlusion EXAM: ULTRASOUND-GUIDED RIGHT COMMON FEMORAL ARTERY ACCESS CERVICAL AND CEREBRAL ANGIOGRAM MECHANICAL THROMBECTOMY RIGHT MCA COMPARISON:  CT imaging same date MEDICATIONS: 2.0 g Ancef. The antibiotic was administered within 1 hour of the procedure ANESTHESIA/SEDATION: General endotracheal tube anesthesia The patient was continuously monitored during the procedure by the interventional radiology nurse under my direct supervision. CONTRAST:  90 cc FLUOROSCOPY TIME:  Fluoroscopy Time: 38 minutes 48 seconds (2,063 mGy). COMPLICATIONS: None TECHNIQUE: Informed written consent was obtained from the patient's family after a thorough discussion of the procedural risks, benefits and alternatives. Specific risks discussed include: Bleeding, infection, contrast reaction, kidney injury/failure, need for further procedure/surgery, arterial injury or dissection, embolization to new territory, intracranial hemorrhage (10-15% risk), neurologic deterioration, cardiopulmonary collapse, death. All questions were addressed. Maximal Sterile Barrier Technique was utilized including during the procedure including caps, mask, sterile gowns, sterile gloves, sterile drape, hand hygiene and skin antiseptic. A timeout was performed prior to the initiation of the procedure. The anesthesia team was present to provide general endotracheal tube anesthesia and for patient monitoring during the procedure. Interventional neuro radiology nursing staff was also present. FINDINGS: Initial Findings: Right common carotid artery:  Normal course caliber and contour. Right external carotid artery: Patent with antegrade flow. Right internal carotid artery: Normal course caliber and contour of the cervical portion. Vertical and petrous segment patent with normal course caliber contour. Cavernous segment patent. Clinoid segment patent. Antegrade flow of the ophthalmic artery. Ophthalmic segment patent.  Terminus patent. Right MCA:  Right M1 occlusion Right ACA: A 1 segment patent. A 2 segment perfuses the right territory. Completion Findings: Right MCA: Complete restoration of flow through the right MCA TICI 3 Flat panel CT: No hemorrhage PROCEDURE: Patient is brought emergently to the neuro angiography suite, with the patient identified appropriately and placed supine position on  the table. Left radial arterial line was placed by the anesthesia team. The patient is then prepped and draped in the usual sterile fashion. Ultrasound survey of the right inguinal region was performed with images stored and sent to PACs. 11 blade scalpel was used to make a small incision. Blunt dissection was performed. A micropuncture needle was used access the right common femoral artery under ultrasound. With excellent arterial blood flow returned, an .018 micro wire was passed through the needle, observed to enter the abdominal aorta under fluoroscopy. The needle was removed, and a micropuncture sheath was placed over the wire. The inner dilator and wire were removed, and an 035 Bentson wire was advanced under fluoroscopy into the abdominal aorta. The sheath was removed and a standard 5 [place] vascular sheath was placed. The dilator was removed and the sheath was flushed. A 73F JB-1 diagnostic catheter was advanced over the wire to the proximal descending thoracic aorta. Wire was then removed. Double flush of the catheter was performed. Catheter was then used to select the right common carotid artery. Catheter was advanced over the wire into the cervical ICA. Wire was removed. Double flush was performed. Formal angiogram was performed, with roadmap achieved. Exchange length Rosen wire was then passed through the diagnostic catheter to the distal cervical ICA and the diagnostic catheter was removed. The 5 French sheath was removed and exchanged for 8 French 55 centimeter BrightTip sheath. Sheath was flushed and attached to pressurized  and heparinized saline bag for constant forward flow. Eight French 95 cm flow gate balloon catheter was then advanced over the wire to the distal cervical segment. Wire was removed. Then a coaxial intermediate catheter and microcatheter combination was prepared on the back table. This combination was CAT-5 catheter and a Trevo Provue18 microcatheter, with a synchro soft wire. This combination was then advanced through the balloon guide into the ICA. System was advanced into the internal carotid artery, to the level of the occlusion. The micro wire was then carefully advanced through the occluded segment. Microcatheter was then pushed through the occluded segment and the wire was removed. Intermediate catheter was advanced over the micro wire to the M1 segment. Blood was then aspirated through the hub of the microcatheter, and a gentle contrast injection was performed confirming intraluminal position. A rotating hemostatic valve was then attached to the back end of the microcatheter, and a pressurized and heparinized saline bag was attached to the catheter. 4 x 40 solitaire device was then selected. Back flush was achieved at the rotating hemostatic valve, and then the device was gently advanced through the microcatheter to the distal end. The retriever was then unsheathed by withdrawing the microcatheter under fluoroscopy. Once the retriever was completely unsheathed, the microcatheter was carefully stripped from the delivery wire of the device. Control angiogram was then performed from the balloon catheter. 3 minute time interval was observed. The balloon on the balloon guide was then inflated to profile of the vessel. Constant aspiration was then performed through the intermediate catheter, and constant gentle aspiration was performed at the balloon guide. This aspiration was continued as the retriever was gently and slowly withdrawn with fluoroscopic observation into the distal intermediate catheter. The entire  system was then gently withdrawn from the intracranial ICA and into the balloon guide. Once the retriever was entirely removed from the system, free aspiration was confirmed at the hub of the balloon guide, with free blood return confirmed. Control angiogram was then performed. Persistent occlusion at the proximal MCA  was confirmed. The microcatheter system was then advanced through the intermediate catheter. Once the micro wire microcatheter were beyond the occlusion, the micro wire was removed and stentreiver device was deployed, stripping the microcatheter from the wire. After the device was deployed across the occlusion, the intermediate catheter was placed at the site of occlusion. The balloon at the balloon guide was inflated to profile of the vessel. Local aspiration was performed at the intermediate catheter upon withdrawal of the device under fluoroscopic observation, with aspiration at the balloon guide. Once the retrieve was withdrawn to the face of the intermediate catheter, both the microcatheter in intermediate catheter were removed from the system. Free aspiration was confirmed at the hub of the balloon guide catheter, with free blood return confirmed. Control angiogram was again performed. Persistent occlusion was identified. We proceeded with a third attempt. The microcatheter system was then advanced through a new intermediate catheter, using Ace 64 catheter. Once the micro wire microcatheter, and intermediate catheter were advanced to the carotid siphon, the microwire and microcatheter were advanced beyond the occlusion. The wire was removed and a new device was deployed, using EMBO trap 5 x 33, stripping the microcatheter from the wire. After the device was deployed across the occlusion, the Ace catheter was placed at the site of occlusion. The balloon at the balloon guide was inflated to profile of the vessel. Local aspiration was performed at the intermediate catheter upon withdrawal of the  device under fluoroscopic observation, with aspiration at the balloon guide. Once the retrieve her was entirely removed from the system, free aspiration was confirmed at the hub of the intermediate catheter, with free blood return confirmed. Balloon on the balloon guide was deflated. Control angiogram was performed. Additional views were performed with multiple obliquities. TI CI 3 was confirmed. Balloon guide was removed, and the bright tip sheath was removed. Eight [place] Angio-Seal was deployed. Flat panel CT was performed. This was reviewed and the patient was extubated. Patient tolerated the procedure well and remained hemodynamically stable throughout. No complications were encountered. Estimated blood loss approximately 100 cc. IMPRESSION: Status post ultrasound-guided access of right common femoral artery for cervical and cerebral angiogram, with mechanical thrombectomy of right M1 occlusion and restoration of TICI 3 flow after 3 passes. Deployment of Angio-Seal for hemostasis. Signed, Dulcy Fanny. Dellia Nims, RPVI [facility] Interventional Radiology Specialists [facility] Radiology PLAN: Right hip straight overnight Target blood pressure 1 25-852 systolic Frequent neurovascular checks. Admission to ICU. Further neuroimaging at the discretion of neurology Electronically Signed   By: Corrie Mckusick D.O.   On: 05/17/2018 18:03   Ir Percutaneous Art Thrombectomy/infusion Intracranial Inc Diag Angio  Result Date: 05/17/2018 INDICATION: 68 year old male with a history of right-sided M1 occlusion, emergent large vessel occlusion EXAM: ULTRASOUND-GUIDED RIGHT COMMON FEMORAL ARTERY ACCESS CERVICAL AND CEREBRAL ANGIOGRAM MECHANICAL THROMBECTOMY RIGHT MCA COMPARISON:  CT imaging same date MEDICATIONS: 2.0 g Ancef. The antibiotic was administered within 1 hour of the procedure ANESTHESIA/SEDATION: General endotracheal tube anesthesia The patient was continuously monitored during the procedure by the interventional  radiology nurse under my direct supervision. CONTRAST:  90 cc FLUOROSCOPY TIME:  Fluoroscopy Time: 38 minutes 48 seconds (2,063 mGy). COMPLICATIONS: None TECHNIQUE: Informed written consent was obtained from the patient's family after a thorough discussion of the procedural risks, benefits and alternatives. Specific risks discussed include: Bleeding, infection, contrast reaction, kidney injury/failure, need for further procedure/surgery, arterial injury or dissection, embolization to new territory, intracranial hemorrhage (10-15% risk), neurologic deterioration, cardiopulmonary collapse, death. All  questions were addressed. Maximal Sterile Barrier Technique was utilized including during the procedure including caps, mask, sterile gowns, sterile gloves, sterile drape, hand hygiene and skin antiseptic. A timeout was performed prior to the initiation of the procedure. The anesthesia team was present to provide general endotracheal tube anesthesia and for patient monitoring during the procedure. Interventional neuro radiology nursing staff was also present. FINDINGS: Initial Findings: Right common carotid artery:  Normal course caliber and contour. Right external carotid artery: Patent with antegrade flow. Right internal carotid artery: Normal course caliber and contour of the cervical portion. Vertical and petrous segment patent with normal course caliber contour. Cavernous segment patent. Clinoid segment patent. Antegrade flow of the ophthalmic artery. Ophthalmic segment patent. Terminus patent. Right MCA:  Right M1 occlusion Right ACA: A 1 segment patent. A 2 segment perfuses the right territory. Completion Findings: Right MCA: Complete restoration of flow through the right MCA TICI 3 Flat panel CT: No hemorrhage PROCEDURE: Patient is brought emergently to the neuro angiography suite, with the patient identified appropriately and placed supine position on the table. Left radial arterial line was placed by the  anesthesia team. The patient is then prepped and draped in the usual sterile fashion. Ultrasound survey of the right inguinal region was performed with images stored and sent to PACs. 11 blade scalpel was used to make a small incision. Blunt dissection was performed. A micropuncture needle was used access the right common femoral artery under ultrasound. With excellent arterial blood flow returned, an .018 micro wire was passed through the needle, observed to enter the abdominal aorta under fluoroscopy. The needle was removed, and a micropuncture sheath was placed over the wire. The inner dilator and wire were removed, and an 035 Bentson wire was advanced under fluoroscopy into the abdominal aorta. The sheath was removed and a standard 5 [place] vascular sheath was placed. The dilator was removed and the sheath was flushed. A 23F JB-1 diagnostic catheter was advanced over the wire to the proximal descending thoracic aorta. Wire was then removed. Double flush of the catheter was performed. Catheter was then used to select the right common carotid artery. Catheter was advanced over the wire into the cervical ICA. Wire was removed. Double flush was performed. Formal angiogram was performed, with roadmap achieved. Exchange length Rosen wire was then passed through the diagnostic catheter to the distal cervical ICA and the diagnostic catheter was removed. The 5 French sheath was removed and exchanged for 8 French 55 centimeter BrightTip sheath. Sheath was flushed and attached to pressurized and heparinized saline bag for constant forward flow. Eight French 95 cm flow gate balloon catheter was then advanced over the wire to the distal cervical segment. Wire was removed. Then a coaxial intermediate catheter and microcatheter combination was prepared on the back table. This combination was CAT-5 catheter and a Trevo Provue18 microcatheter, with a synchro soft wire. This combination was then advanced through the balloon guide  into the ICA. System was advanced into the internal carotid artery, to the level of the occlusion. The micro wire was then carefully advanced through the occluded segment. Microcatheter was then pushed through the occluded segment and the wire was removed. Intermediate catheter was advanced over the micro wire to the M1 segment. Blood was then aspirated through the hub of the microcatheter, and a gentle contrast injection was performed confirming intraluminal position. A rotating hemostatic valve was then attached to the back end of the microcatheter, and a pressurized and heparinized saline bag was attached  to the catheter. 4 x 40 solitaire device was then selected. Back flush was achieved at the rotating hemostatic valve, and then the device was gently advanced through the microcatheter to the distal end. The retriever was then unsheathed by withdrawing the microcatheter under fluoroscopy. Once the retriever was completely unsheathed, the microcatheter was carefully stripped from the delivery wire of the device. Control angiogram was then performed from the balloon catheter. 3 minute time interval was observed. The balloon on the balloon guide was then inflated to profile of the vessel. Constant aspiration was then performed through the intermediate catheter, and constant gentle aspiration was performed at the balloon guide. This aspiration was continued as the retriever was gently and slowly withdrawn with fluoroscopic observation into the distal intermediate catheter. The entire system was then gently withdrawn from the intracranial ICA and into the balloon guide. Once the retriever was entirely removed from the system, free aspiration was confirmed at the hub of the balloon guide, with free blood return confirmed. Control angiogram was then performed. Persistent occlusion at the proximal MCA was confirmed. The microcatheter system was then advanced through the intermediate catheter. Once the micro wire  microcatheter were beyond the occlusion, the micro wire was removed and stentreiver device was deployed, stripping the microcatheter from the wire. After the device was deployed across the occlusion, the intermediate catheter was placed at the site of occlusion. The balloon at the balloon guide was inflated to profile of the vessel. Local aspiration was performed at the intermediate catheter upon withdrawal of the device under fluoroscopic observation, with aspiration at the balloon guide. Once the retrieve was withdrawn to the face of the intermediate catheter, both the microcatheter in intermediate catheter were removed from the system. Free aspiration was confirmed at the hub of the balloon guide catheter, with free blood return confirmed. Control angiogram was again performed. Persistent occlusion was identified. We proceeded with a third attempt. The microcatheter system was then advanced through a new intermediate catheter, using Ace 64 catheter. Once the micro wire microcatheter, and intermediate catheter were advanced to the carotid siphon, the microwire and microcatheter were advanced beyond the occlusion. The wire was removed and a new device was deployed, using EMBO trap 5 x 33, stripping the microcatheter from the wire. After the device was deployed across the occlusion, the Ace catheter was placed at the site of occlusion. The balloon at the balloon guide was inflated to profile of the vessel. Local aspiration was performed at the intermediate catheter upon withdrawal of the device under fluoroscopic observation, with aspiration at the balloon guide. Once the retrieve her was entirely removed from the system, free aspiration was confirmed at the hub of the intermediate catheter, with free blood return confirmed. Balloon on the balloon guide was deflated. Control angiogram was performed. Additional views were performed with multiple obliquities. TI CI 3 was confirmed. Balloon guide was removed, and the  bright tip sheath was removed. Eight [place] Angio-Seal was deployed. Flat panel CT was performed. This was reviewed and the patient was extubated. Patient tolerated the procedure well and remained hemodynamically stable throughout. No complications were encountered. Estimated blood loss approximately 100 cc. IMPRESSION: Status post ultrasound-guided access of right common femoral artery for cervical and cerebral angiogram, with mechanical thrombectomy of right M1 occlusion and restoration of TICI 3 flow after 3 passes. Deployment of Angio-Seal for hemostasis. Signed, Dulcy Fanny. Dellia Nims, RPVI Vascular and Interventional Radiology Specialists [facility] Radiology PLAN: Right hip straight overnight Target blood pressure 1 20-140  systolic Frequent neurovascular checks. Admission to ICU. Further neuroimaging at the discretion of neurology Electronically Signed   By: Corrie Mckusick D.O.   On: 05/17/2018 18:03   Ct Head Code Stroke Wo Contrast  Result Date: 05/17/2018 CLINICAL DATA:  Code stroke.  Left-sided flaccid.  Facial droop EXAM: CT HEAD WITHOUT CONTRAST TECHNIQUE: Contiguous axial images were obtained from the base of the skull through the vertex without intravenous contrast. COMPARISON:  None. FINDINGS: Brain: Image quality degraded by motion. Ventricle size normal. Negative for hemorrhage or mass. No evidence of acute infarct however sensitivity is decreased due to motion. Vascular: Hyperdense right middle cerebral artery distal right M1 and M2 segments. This is best seen on coronal and sagittal images and is difficult to see on the axial images due to motion. Skull: Negative Sinuses/Orbits: Mild mucosal edema paranasal sinuses. Negative orbit Other: None ASPECTS ([place] Stroke Program Early CT Score) - Ganglionic level infarction (caudate, lentiform nuclei, internal capsule, insula, M1-M3 cortex): 7 - Supraganglionic infarction (M4-M6 cortex): 3 Total score (0-10 with 10 being normal): 10 IMPRESSION: 1.  Hyperdense right MCA suggestive of acute thrombosis. Negative for hemorrhage. No CT evidence of acute infarct however sensitivity is diminished due to considerable motion. 2. ASPECTS is 10 3. These results were called by telephone at the time of interpretation on 05/17/2018 at 1:56 pm to Dr. Leonel Ramsay , who verbally acknowledged these results. Electronically Signed   By: Franchot Gallo M.D.   On: 05/17/2018 13:57    Labs:  CBC: Recent Labs    05/17/18 1340 05/18/18 0617  WBC 6.4 9.8  HGB 16.0 14.6  HCT 49.2 43.5  PLT 246 242    COAGS: Recent Labs    05/17/18 1340  INR 1.06  APTT 27    BMP: Recent Labs    05/17/18 1340 05/17/18 1353 05/18/18 0617  NA 139  --  138  K 3.9  --  4.0  CL 105  --  106  CO2 28  --  20*  GLUCOSE 60*  --  162*  BUN 14  --  12  CALCIUM 9.4  --  8.5*  CREATININE 0.93 0.90 1.00  GFRNONAA >60  --  >60  GFRAA >60  --  >60    LIVER FUNCTION TESTS: Recent Labs    05/17/18 1340  BILITOT 0.8  AST 23  ALT 21  ALKPHOS 61  PROT 6.7  ALBUMIN 4.1    Assessment and Plan:  CVA; R MCA revascularization Doing well Will follow with Neuro  Electronically Signed: Lavonia Drafts, PA-C 05/18/2018, 9:25 AM   I spent a total of 15 Minutes at the the patient's bedside AND on the patient's hospital floor or unit, greater than 50% of which was counseling/coordinating care for CVA/R MCA revasc

## 2018-05-18 NOTE — Progress Notes (Signed)
PT Cancellation Note  Patient Details Name: ARISTEO HANKERSON MRN: [id number] DOB: 25-Jan-1951   Cancelled Treatment:    Reason Eval/Treat Not Completed: Active bedrest order. Please update activity order, when appropriate, for PT to proceed with eval.   Lorriane Shire 05/18/2018, 7:44 AM  Lorrin Goodell, PT  Office # [phone] Pager [phone]

## 2018-05-18 NOTE — Progress Notes (Signed)
ANTICOAGULATION CONSULT NOTE - Initial Consult  Pharmacy Consult for Eliquis Indication: atrial fibrillation  Allergies  Allergen Reactions  . Hctz [Hydrochlorothiazide] Other (See Comments)    myalgia  . Lipitor [Atorvastatin Calcium] Other (See Comments)    Blood sugar rises, Myalgia   Patient Measurements: Height: 6\' 3"  (190.5 cm) Weight: 212 lb 1.3 oz (96.2 kg) IBW/kg (Calculated) : 84.5  Vital Signs: Temp: 98 F (36.7 C) (02/22 1229) Temp Source: Oral (02/22 1229) BP: 132/98 (02/22 1400) Pulse Rate: 105 (02/22 1400)  Labs: Recent Labs    05/17/18 1340 05/17/18 1353 05/18/18 0617  HGB 16.0  --  14.6  HCT 49.2  --  43.5  PLT 246  --  242  APTT 27  --   --   LABPROT 13.7  --   --   INR 1.06  --   --   CREATININE 0.93 0.90 1.00   Estimated Creatinine Clearance: 83.3 mL/min (by C-G formula based on SCr of 1 mg/dL).  Assessment: 68 yoM presenting as a code stroke, s/p tPA on 2/21 @ 1354 and thrombectomy. Noted new onset afib, CHADS-VASc = 4. Pharmacy has been consulted for Eliquis dosing. CBC stable WNL. Scr 1, estimated CrCl ~83 mL/min. Patient weight 96.2kg. Will delay Eliquis start until 2/23 PM for >24 hrs since tPA administration.  Goal of Therapy:  Monitor platelets by anticoagulation protocol: Yes   Plan:  Start Eliquis 5mg  PO BID tomorrow PM Monitor s/sx of bleeding Pharmacy will sign off as dose adjustments are not anticipated  [place] N. Gerarda Fraction, PharmD, [place] PGY2 Infectious Diseases Pharmacy Resident Phone: [phone] 05/18/2018,2:48 PM

## 2018-05-18 NOTE — Consult Note (Addendum)
Cardiology Consultation:   Patient ID: Patrick Salinas MRN: [id number]; DOB: [birth year]  Admit date: 05/17/2018 Date of Consult: 05/18/2018  Primary Care Provider: Henderson Baltimore, FNP Primary Cardiologist: Candee Furbish, MD New Primary Electrophysiologist:  None    Patient Profile:   Patrick Salinas is a 68 y.o. male with acute right MCA stroke who is being seen today for the evaluation of new onset paroxysmal atrial fibrillation at the request of Dr. Judyann Munson. Erlinda Hong.  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  While he was sitting at the table doing, his wife noted left side facial droop hemiparesis and right-sided gaze.  Started to lean to the left at the table.  Called 911 immediately.  He has a past medical history of hyperlipidemia and had been on atorvastatin at one point but had stopped this because of elevated blood sugars he states.  No prior cardiac or stroke history.  He was taken to the [facility] emergency department where TPA was administered.  Unfortunately, residual symptoms ensued.  He was then taken to the interventional neuroradiology suite where thrombectomy took place.  Marked improvement.  Atrial fibrillation with rapid ventricular response was noted upon arrival.  This is new for him.  Diltiazem IV was initially utilized and then changed to p.o. diltiazem.  Low-dose 30 mg.  He enjoys playing tennis.  Enjoys occasional gin and tonic  No early family history of coronary artery disease or atrial fibrillation.   Past Medical History:  Diagnosis Date  . Cancer [facility])    Prostate cancer -dx 5'16  . Hypercholesterolemia    no meds due to adverse reaction    Past Surgical History:  Procedure Laterality Date  . BIOPSY PROSTATE     5'16  . CERVICAL FUSION    . COLONOSCOPY W/ POLYPECTOMY     x2 benign  . IR CT HEAD LTD  05/17/2018  . IR PERCUTANEOUS ART  THROMBECTOMY/INFUSION INTRACRANIAL INC DIAG ANGIO  05/17/2018  . IR US GUIDE VASC ACCESS RIGHT  05/17/2018  . ROBOT ASSISTED LAPAROSCOPIC RADICAL PROSTATECTOMY N/A 11/02/2014   Procedure: ROBOTIC ASSISTED LAPAROSCOPIC RADICAL PROSTATECTOMY LEVEL 1;  Surgeon: Raynelle Bring, MD;  Location: [facility] ORS;  Service: Urology;  Laterality: N/A;  . TONSILLECTOMY       Home Medications:  Prior to Admission medications   Medication Sig Start Date End Date Taking? Authorizing Provider  HYDROcodone-acetaminophen (NORCO) 5-325 MG per tablet Take 1-2 tablets by mouth every 6 (six) hours as needed. 11/02/14   Debbrah Alar, PA-C  sulfamethoxazole-trimethoprim (BACTRIM DS,SEPTRA DS) 800-160 MG per tablet Take 1 tablet by mouth 2 (two) times daily. Start the day prior to foley removal appointment 11/02/14   Debbrah Alar, PA-C    Inpatient Medications: Scheduled Meds: . diltiazem  30 mg Oral Q6H  . pantoprazole  40 mg Oral Daily  . rosuvastatin  20 mg Oral q1800  . sodium chloride flush  3 mL Intravenous Once   Continuous Infusions: . sodium chloride 100 mL/hr at 05/18/18 0900  . diltiazem (CARDIZEM) infusion     PRN Meds: acetaminophen **OR** [DISCONTINUED] acetaminophen (TYLENOL) oral liquid 160 mg/5 mL **OR** [DISCONTINUED] acetaminophen, senna-docusate  Allergies:    Allergies  Allergen Reactions  . Hctz [Hydrochlorothiazide] Other (See Comments)    myalgia  . Lipitor [Atorvastatin Calcium] Other (See Comments)    Blood sugar rises, Myalgia    Social History:   Social History  Socioeconomic History  . Marital status: Married    Spouse name: Not on file  . Number of children: Not on file  . Years of education: Not on file  . Highest education level: Not on file  Occupational History  . Not on file  Social Needs  . Financial resource strain: Not on file  . Food insecurity:    Worry: Not on file    Inability: Not on file  . Transportation needs:    Medical: Not on file    Non-medical: Not  on file  Tobacco Use  . Smoking status: Never Smoker  Substance and Sexual Activity  . Alcohol use: Yes    Comment: one drink daily  . Drug use: No  . Sexual activity: Not on file  Lifestyle  . Physical activity:    Days per week: Not on file    Minutes per session: Not on file  . Stress: Not on file  Relationships  . Social connections:    Talks on phone: Not on file    Gets together: Not on file    Attends religious service: Not on file    Active member of club or organization: Not on file    Attends meetings of clubs or organizations: Not on file    Relationship status: Not on file  . Intimate partner violence:    Fear of current or ex partner: Not on file    Emotionally abused: Not on file    Physically abused: Not on file    Forced sexual activity: Not on file  Other Topics Concern  . Not on file  Social History Narrative  . Not on file    Family History:   No family history on file.  No early family history of CAD, mother had schizophrenia  ROS:  Please see the history of present illness.  No fevers chills nausea vomiting syncope bleeding chest pain All other ROS reviewed and negative.     Physical Exam/Data:   Vitals:   05/18/18 0630 05/18/18 0700 05/18/18 0800 05/18/18 0900  BP:  122/82 118/89 126/81  Pulse: 97 93 92 99  Resp: 18 15 20 18   Temp:      TempSrc:      SpO2: 97% 97% 94% 96%  Weight:      Height:        Intake/Output Summary (Last 24 hours) at 05/18/2018 1008 Last data filed at 05/18/2018 0900 Gross per 24 hour  Intake 2887.68 ml  Output 2050 ml  Net 837.68 ml   Last 3 Weights 05/17/2018 05/17/2018 11/02/2014  Weight (lbs) 212 lb 1.3 oz 211 lb 10.3 oz 198 lb  Weight (kg) 96.2 kg 96 kg 89.812 kg     Body mass index is 26.51 kg/m.  General:  Well nourished, well developed, in no acute distress HEENT: normal Lymph: no adenopathy Neck: no JVD Endocrine:  No thryomegaly Vascular: No carotid bruits; FA pulses 2+ bilaterally without bruits    Cardiac:  normal S1, S2; irreg normal rate; no murmur  Lungs:  clear to auscultation bilaterally, no wheezing, rhonchi or rales  Abd: soft, nontender, no hepatomegaly  Ext: no edema Musculoskeletal:  No deformities, BUE and BLE strength normal and equal Skin: warm and dry  Neuro:  CNs 2-12 intact, no focal abnormalities noted Psych:  Normal affect   EKG:  The EKG was personally reviewed and demonstrates: 05/17/2018 at 1403 shows atrial fibrillation heart rate 113 bpm with no other significant abnormalities personally reviewed  and interpreted  Telemetry:  Telemetry was personally reviewed and demonstrates: Atrial fibrillation heart rate in the 80s  Relevant CV Studies:  Echocardiogram pending  Laboratory Data:  Chemistry Recent Labs  Lab 05/17/18 1340 05/17/18 1353 05/18/18 0617  NA 139  --  138  K 3.9  --  4.0  CL 105  --  106  CO2 28  --  20*  GLUCOSE 60*  --  162*  BUN 14  --  12  CREATININE 0.93 0.90 1.00  CALCIUM 9.4  --  8.5*  GFRNONAA >60  --  >60  GFRAA >60  --  >60  ANIONGAP 6  --  12    Recent Labs  Lab 05/17/18 1340  PROT 6.7  ALBUMIN 4.1  AST 23  ALT 21  ALKPHOS 61  BILITOT 0.8   Hematology Recent Labs  Lab 05/17/18 1340 05/18/18 0617  WBC 6.4 9.8  RBC 5.39 4.77  HGB 16.0 14.6  HCT 49.2 43.5  MCV 91.3 91.2  MCH 29.7 30.6  MCHC 32.5 33.6  RDW 13.0 13.2  PLT 246 242   Cardiac EnzymesNo results for input(s): TROPONINI in the last 168 hours. No results for input(s): TROPIPOC in the last 168 hours.  BNPNo results for input(s): BNP, PROBNP in the last 168 hours.  DDimer No results for input(s): DDIMER in the last 168 hours.  Radiology/Studies:  Ct Angio Head W Or Wo Contrast  Result Date: 05/17/2018 CLINICAL DATA:  Stroke.  Left-sided weakness.  Atrial fibrillation. EXAM: CT ANGIOGRAPHY HEAD AND NECK TECHNIQUE: Multidetector CT imaging of the head and neck was performed using the standard protocol during bolus administration of intravenous  contrast. Multiplanar CT image reconstructions and MIPs were obtained to evaluate the vascular anatomy. Carotid stenosis measurements (when applicable) are obtained utilizing NASCET criteria, using the distal internal carotid diameter as the denominator. CONTRAST:  57mL ISOVUE-370 IOPAMIDOL (ISOVUE-370) INJECTION 76% COMPARISON:  CT head 05/17/2018 FINDINGS: CTA NECK FINDINGS Aortic arch: Standard branching. Imaged portion shows no evidence of aneurysm or dissection. No significant stenosis of the major arch vessel origins. Right carotid system: Widely patent without stenosis. Left carotid system: Widely patent without stenosis. Vertebral arteries: Both vertebral arteries widely patent without stenosis. Skeleton: ACDF with solid fusion C4-5. No acute skeletal abnormality. Other neck: Negative Upper chest: Negative Review of the MIP images confirms the above findings CTA HEAD FINDINGS Anterior circulation: Right cavernous carotid widely patent. Right anterior cerebral artery widely patent. Abrupt occlusion distal right M1 segment with clot in the distal right M1 segment and proximal M2 segments compatible with embolus. This corresponds to hyperdense right MCA on CT. Embolus extends into the inferior division right MCA with flow distal to the occlusion due to collaterals. There is also embolus in the superior division of the right MCA. Cavernous carotid widely patent. Anterior and middle cerebral arteries widely patent. Posterior circulation: Both vertebral arteries patent to the basilar. PICA patent bilaterally. Basilar widely patent. Superior cerebellar and posterior cerebral arteries patent bilaterally without stenosis. Patent posterior communicating artery bilaterally. Venous sinuses: Not well visualized due to arterial phase Anatomic variants: None Delayed phase: Not performed Review of the MIP images confirms the above findings IMPRESSION: 1. Acute occlusion distal right M1 segment extending into the right M2  segments. This is compatible with embolus given the history of atrial fibrillation. There is flow distal to the embolus due to collateral circulation. No other intracranial occlusion. 2. No significant carotid or vertebral artery stenosis in the neck 3.  These results were called by telephone at the time of interpretation on 05/17/2018 at 2:12 pm to Dr. Leonel Ramsay , who verbally acknowledged these results. Electronically Signed   By: Franchot Gallo M.D.   On: 05/17/2018 14:13   Ct Head Wo Contrast  Result Date: 05/17/2018 CLINICAL DATA:  Stroke. Post tPA. Worsening neurologic examination. EXAM: CT HEAD WITHOUT CONTRAST TECHNIQUE: Contiguous axial images were obtained from the base of the skull through the vertex without intravenous contrast. COMPARISON:  CT head 05/17/2018 FINDINGS: Brain: Ill-defined hypodensity in the right inferior temporal lobe. Given the prior right MCA occlusion, this likely represents acute infarct. This is not visualized on the CT from earlier today however the prior study was degraded by motion. Never less, this does appear to be a new finding. No associated hemorrhage. Ventricle size remains normal. Vascular: Vascular contrast due to CTA Skull: Negative Sinuses/Orbits: Mild mucosal edema paranasal sinuses. Other: None IMPRESSION: Subtle area of hypodensity in the right inferior temporal lobe appears new and likely represents an area of acute infarct. Negative for hemorrhage. Images were reviewed with Dr. Leonel Ramsay at 1540 hours. Electronically Signed   By: Franchot Gallo M.D.   On: 05/17/2018 16:10   Ct Angio Neck W Or Wo Contrast  Result Date: 05/17/2018 CLINICAL DATA:  Stroke.  Left-sided weakness.  Atrial fibrillation. EXAM: CT ANGIOGRAPHY HEAD AND NECK TECHNIQUE: Multidetector CT imaging of the head and neck was performed using the standard protocol during bolus administration of intravenous contrast. Multiplanar CT image reconstructions and MIPs were obtained to evaluate the  vascular anatomy. Carotid stenosis measurements (when applicable) are obtained utilizing NASCET criteria, using the distal internal carotid diameter as the denominator. CONTRAST:  26mL ISOVUE-370 IOPAMIDOL (ISOVUE-370) INJECTION 76% COMPARISON:  CT head 05/17/2018 FINDINGS: CTA NECK FINDINGS Aortic arch: Standard branching. Imaged portion shows no evidence of aneurysm or dissection. No significant stenosis of the major arch vessel origins. Right carotid system: Widely patent without stenosis. Left carotid system: Widely patent without stenosis. Vertebral arteries: Both vertebral arteries widely patent without stenosis. Skeleton: ACDF with solid fusion C4-5. No acute skeletal abnormality. Other neck: Negative Upper chest: Negative Review of the MIP images confirms the above findings CTA HEAD FINDINGS Anterior circulation: Right cavernous carotid widely patent. Right anterior cerebral artery widely patent. Abrupt occlusion distal right M1 segment with clot in the distal right M1 segment and proximal M2 segments compatible with embolus. This corresponds to hyperdense right MCA on CT. Embolus extends into the inferior division right MCA with flow distal to the occlusion due to collaterals. There is also embolus in the superior division of the right MCA. Cavernous carotid widely patent. Anterior and middle cerebral arteries widely patent. Posterior circulation: Both vertebral arteries patent to the basilar. PICA patent bilaterally. Basilar widely patent. Superior cerebellar and posterior cerebral arteries patent bilaterally without stenosis. Patent posterior communicating artery bilaterally. Venous sinuses: Not well visualized due to arterial phase Anatomic variants: None Delayed phase: Not performed Review of the MIP images confirms the above findings IMPRESSION: 1. Acute occlusion distal right M1 segment extending into the right M2 segments. This is compatible with embolus given the history of atrial fibrillation.  There is flow distal to the embolus due to collateral circulation. No other intracranial occlusion. 2. No significant carotid or vertebral artery stenosis in the neck 3. These results were called by telephone at the time of interpretation on 05/17/2018 at 2:12 pm to Dr. Leonel Ramsay , who verbally acknowledged these results. Electronically Signed   By: Juanda Crumble  Carlis Abbott M.D.   On: 05/17/2018 14:13   [place]  Result Date: 05/17/2018 INDICATION: 68 year old male with a history of right-sided M1 occlusion, emergent large vessel occlusion EXAM: ULTRASOUND-GUIDED RIGHT COMMON FEMORAL ARTERY ACCESS CERVICAL AND CEREBRAL ANGIOGRAM MECHANICAL THROMBECTOMY RIGHT MCA COMPARISON:  CT imaging same date MEDICATIONS: 2.0 g Ancef. The antibiotic was administered within 1 hour of the procedure ANESTHESIA/SEDATION: General endotracheal tube anesthesia The patient was continuously monitored during the procedure by the interventional radiology nurse under my direct supervision. CONTRAST:  90 cc FLUOROSCOPY TIME:  Fluoroscopy Time: 38 minutes 48 seconds (2,063 mGy). COMPLICATIONS: None TECHNIQUE: Informed written consent was obtained from the patient's family after a thorough discussion of the procedural risks, benefits and alternatives. Specific risks discussed include: Bleeding, infection, contrast reaction, kidney injury/failure, need for further procedure/surgery, arterial injury or dissection, embolization to new territory, intracranial hemorrhage (10-15% risk), neurologic deterioration, cardiopulmonary collapse, death. All questions were addressed. Maximal Sterile Barrier Technique was utilized including during the procedure including caps, mask, sterile gowns, sterile gloves, sterile drape, hand hygiene and skin antiseptic. A timeout was performed prior to the initiation of the procedure. The anesthesia team was present to provide general endotracheal tube anesthesia and for patient monitoring during the procedure.  Interventional neuro radiology nursing staff was also present. FINDINGS: Initial Findings: Right common carotid artery:  Normal course caliber and contour. Right external carotid artery: Patent with antegrade flow. Right internal carotid artery: Normal course caliber and contour of the cervical portion. Vertical and petrous segment patent with normal course caliber contour. Cavernous segment patent. Clinoid segment patent. Antegrade flow of the ophthalmic artery. Ophthalmic segment patent. Terminus patent. Right MCA:  Right M1 occlusion Right ACA: A 1 segment patent. A 2 segment perfuses the right territory. Completion Findings: Right MCA: Complete restoration of flow through the right MCA TICI 3 Flat panel CT: No hemorrhage PROCEDURE: Patient is brought emergently to the neuro angiography suite, with the patient identified appropriately and placed supine position on the table. Left radial arterial line was placed by the anesthesia team. The patient is then prepped and draped in the usual sterile fashion. Ultrasound survey of the right inguinal region was performed with images stored and sent to PACs. 11 blade scalpel was used to make a small incision. Blunt dissection was performed. A micropuncture needle was used access the right common femoral artery under ultrasound. With excellent arterial blood flow returned, an .018 micro wire was passed through the needle, observed to enter the abdominal aorta under fluoroscopy. The needle was removed, and a micropuncture sheath was placed over the wire. The inner dilator and wire were removed, and an 035 Bentson wire was advanced under fluoroscopy into the abdominal aorta. The sheath was removed and a standard 5 [place] vascular sheath was placed. The dilator was removed and the sheath was flushed. A 78F JB-1 diagnostic catheter was advanced over the wire to the proximal descending thoracic aorta. Wire was then removed. Double flush of the catheter was performed. Catheter was  then used to select the right common carotid artery. Catheter was advanced over the wire into the cervical ICA. Wire was removed. Double flush was performed. Formal angiogram was performed, with roadmap achieved. Exchange length Rosen wire was then passed through the diagnostic catheter to the distal cervical ICA and the diagnostic catheter was removed. The 5 French sheath was removed and exchanged for 8 French 55 centimeter BrightTip sheath. Sheath was flushed and attached to pressurized and heparinized saline bag for constant  forward flow. Eight French 95 cm flow gate balloon catheter was then advanced over the wire to the distal cervical segment. Wire was removed. Then a coaxial intermediate catheter and microcatheter combination was prepared on the back table. This combination was CAT-5 catheter and a Trevo Provue18 microcatheter, with a synchro soft wire. This combination was then advanced through the balloon guide into the ICA. System was advanced into the internal carotid artery, to the level of the occlusion. The micro wire was then carefully advanced through the occluded segment. Microcatheter was then pushed through the occluded segment and the wire was removed. Intermediate catheter was advanced over the micro wire to the M1 segment. Blood was then aspirated through the hub of the microcatheter, and a gentle contrast injection was performed confirming intraluminal position. A rotating hemostatic valve was then attached to the back end of the microcatheter, and a pressurized and heparinized saline bag was attached to the catheter. 4 x 40 solitaire device was then selected. Back flush was achieved at the rotating hemostatic valve, and then the device was gently advanced through the microcatheter to the distal end. The retriever was then unsheathed by withdrawing the microcatheter under fluoroscopy. Once the retriever was completely unsheathed, the microcatheter was carefully stripped from the delivery wire  of the device. Control angiogram was then performed from the balloon catheter. 3 minute time interval was observed. The balloon on the balloon guide was then inflated to profile of the vessel. Constant aspiration was then performed through the intermediate catheter, and constant gentle aspiration was performed at the balloon guide. This aspiration was continued as the retriever was gently and slowly withdrawn with fluoroscopic observation into the distal intermediate catheter. The entire system was then gently withdrawn from the intracranial ICA and into the balloon guide. Once the retriever was entirely removed from the system, free aspiration was confirmed at the hub of the balloon guide, with free blood return confirmed. Control angiogram was then performed. Persistent occlusion at the proximal MCA was confirmed. The microcatheter system was then advanced through the intermediate catheter. Once the micro wire microcatheter were beyond the occlusion, the micro wire was removed and stentreiver device was deployed, stripping the microcatheter from the wire. After the device was deployed across the occlusion, the intermediate catheter was placed at the site of occlusion. The balloon at the balloon guide was inflated to profile of the vessel. Local aspiration was performed at the intermediate catheter upon withdrawal of the device under fluoroscopic observation, with aspiration at the balloon guide. Once the retrieve was withdrawn to the face of the intermediate catheter, both the microcatheter in intermediate catheter were removed from the system. Free aspiration was confirmed at the hub of the balloon guide catheter, with free blood return confirmed. Control angiogram was again performed. Persistent occlusion was identified. We proceeded with a third attempt. The microcatheter system was then advanced through a new intermediate catheter, using Ace 64 catheter. Once the micro wire microcatheter, and intermediate  catheter were advanced to the carotid siphon, the microwire and microcatheter were advanced beyond the occlusion. The wire was removed and a new device was deployed, using EMBO trap 5 x 33, stripping the microcatheter from the wire. After the device was deployed across the occlusion, the Ace catheter was placed at the site of occlusion. The balloon at the balloon guide was inflated to profile of the vessel. Local aspiration was performed at the intermediate catheter upon withdrawal of the device under fluoroscopic observation, with aspiration at the balloon  guide. Once the retrieve her was entirely removed from the system, free aspiration was confirmed at the hub of the intermediate catheter, with free blood return confirmed. Balloon on the balloon guide was deflated. Control angiogram was performed. Additional views were performed with multiple obliquities. TI CI 3 was confirmed. Balloon guide was removed, and the bright tip sheath was removed. Eight [place] Angio-Seal was deployed. Flat panel CT was performed. This was reviewed and the patient was extubated. Patient tolerated the procedure well and remained hemodynamically stable throughout. No complications were encountered. Estimated blood loss approximately 100 cc. IMPRESSION: Status post ultrasound-guided access of right common femoral artery for cervical and cerebral angiogram, with mechanical thrombectomy of right M1 occlusion and restoration of TICI 3 flow after 3 passes. Deployment of Angio-Seal for hemostasis. Signed, Dulcy Fanny. Dellia Nims, RPVI [facility] Radiology PLAN: Right hip straight overnight Target blood pressure 1 15-176 systolic Frequent neurovascular checks. Admission to ICU. Further neuroimaging at the discretion of neurology Electronically Signed   By: Corrie Mckusick D.O.   On: 05/17/2018 18:03   Ir US Guide Vasc Access Right  Result Date: 05/17/2018 INDICATION: 68 year old male with a  history of right-sided M1 occlusion, emergent large vessel occlusion EXAM: ULTRASOUND-GUIDED RIGHT COMMON FEMORAL ARTERY ACCESS CERVICAL AND CEREBRAL ANGIOGRAM MECHANICAL THROMBECTOMY RIGHT MCA COMPARISON:  CT imaging same date MEDICATIONS: 2.0 g Ancef. The antibiotic was administered within 1 hour of the procedure ANESTHESIA/SEDATION: General endotracheal tube anesthesia The patient was continuously monitored during the procedure by the interventional radiology nurse under my direct supervision. CONTRAST:  90 cc FLUOROSCOPY TIME:  Fluoroscopy Time: 38 minutes 48 seconds (2,063 mGy). COMPLICATIONS: None TECHNIQUE: Informed written consent was obtained from the patient's family after a thorough discussion of the procedural risks, benefits and alternatives. Specific risks discussed include: Bleeding, infection, contrast reaction, kidney injury/failure, need for further procedure/surgery, arterial injury or dissection, embolization to new territory, intracranial hemorrhage (10-15% risk), neurologic deterioration, cardiopulmonary collapse, death. All questions were addressed. Maximal Sterile Barrier Technique was utilized including during the procedure including caps, mask, sterile gowns, sterile gloves, sterile drape, hand hygiene and skin antiseptic. A timeout was performed prior to the initiation of the procedure. The anesthesia team was present to provide general endotracheal tube anesthesia and for patient monitoring during the procedure. Interventional neuro radiology nursing staff was also present. FINDINGS: Initial Findings: Right common carotid artery:  Normal course caliber and contour. Right external carotid artery: Patent with antegrade flow. Right internal carotid artery: Normal course caliber and contour of the cervical portion. Vertical and petrous segment patent with normal course caliber contour. Cavernous segment patent. Clinoid segment patent. Antegrade flow of the ophthalmic artery. Ophthalmic  segment patent. Terminus patent. Right MCA:  Right M1 occlusion Right ACA: A 1 segment patent. A 2 segment perfuses the right territory. Completion Findings: Right MCA: Complete restoration of flow through the right MCA TICI 3 Flat panel CT: No hemorrhage PROCEDURE: Patient is brought emergently to the neuro angiography suite, with the patient identified appropriately and placed supine position on the table. Left radial arterial line was placed by the anesthesia team. The patient is then prepped and draped in the usual sterile fashion. Ultrasound survey of the right inguinal region was performed with images stored and sent to PACs. 11 blade scalpel was used to make a small incision. Blunt dissection was performed. A micropuncture needle was used access the right common femoral artery under ultrasound. With excellent arterial blood flow returned, an .018 micro  wire was passed through the needle, observed to enter the abdominal aorta under fluoroscopy. The needle was removed, and a micropuncture sheath was placed over the wire. The inner dilator and wire were removed, and an 035 Bentson wire was advanced under fluoroscopy into the abdominal aorta. The sheath was removed and a standard 5 [place] vascular sheath was placed. The dilator was removed and the sheath was flushed. A 44F JB-1 diagnostic catheter was advanced over the wire to the proximal descending thoracic aorta. Wire was then removed. Double flush of the catheter was performed. Catheter was then used to select the right common carotid artery. Catheter was advanced over the wire into the cervical ICA. Wire was removed. Double flush was performed. Formal angiogram was performed, with roadmap achieved. Exchange length Rosen wire was then passed through the diagnostic catheter to the distal cervical ICA and the diagnostic catheter was removed. The 5 French sheath was removed and exchanged for 8 French 55 centimeter BrightTip sheath. Sheath was flushed and attached  to pressurized and heparinized saline bag for constant forward flow. Eight French 95 cm flow gate balloon catheter was then advanced over the wire to the distal cervical segment. Wire was removed. Then a coaxial intermediate catheter and microcatheter combination was prepared on the back table. This combination was CAT-5 catheter and a Trevo Provue18 microcatheter, with a synchro soft wire. This combination was then advanced through the balloon guide into the ICA. System was advanced into the internal carotid artery, to the level of the occlusion. The micro wire was then carefully advanced through the occluded segment. Microcatheter was then pushed through the occluded segment and the wire was removed. Intermediate catheter was advanced over the micro wire to the M1 segment. Blood was then aspirated through the hub of the microcatheter, and a gentle contrast injection was performed confirming intraluminal position. A rotating hemostatic valve was then attached to the back end of the microcatheter, and a pressurized and heparinized saline bag was attached to the catheter. 4 x 40 solitaire device was then selected. Back flush was achieved at the rotating hemostatic valve, and then the device was gently advanced through the microcatheter to the distal end. The retriever was then unsheathed by withdrawing the microcatheter under fluoroscopy. Once the retriever was completely unsheathed, the microcatheter was carefully stripped from the delivery wire of the device. Control angiogram was then performed from the balloon catheter. 3 minute time interval was observed. The balloon on the balloon guide was then inflated to profile of the vessel. Constant aspiration was then performed through the intermediate catheter, and constant gentle aspiration was performed at the balloon guide. This aspiration was continued as the retriever was gently and slowly withdrawn with fluoroscopic observation into the distal intermediate  catheter. The entire system was then gently withdrawn from the intracranial ICA and into the balloon guide. Once the retriever was entirely removed from the system, free aspiration was confirmed at the hub of the balloon guide, with free blood return confirmed. Control angiogram was then performed. Persistent occlusion at the proximal MCA was confirmed. The microcatheter system was then advanced through the intermediate catheter. Once the micro wire microcatheter were beyond the occlusion, the micro wire was removed and stentreiver device was deployed, stripping the microcatheter from the wire. After the device was deployed across the occlusion, the intermediate catheter was placed at the site of occlusion. The balloon at the balloon guide was inflated to profile of the vessel. Local aspiration was performed at the intermediate catheter  upon withdrawal of the device under fluoroscopic observation, with aspiration at the balloon guide. Once the retrieve was withdrawn to the face of the intermediate catheter, both the microcatheter in intermediate catheter were removed from the system. Free aspiration was confirmed at the hub of the balloon guide catheter, with free blood return confirmed. Control angiogram was again performed. Persistent occlusion was identified. We proceeded with a third attempt. The microcatheter system was then advanced through a new intermediate catheter, using Ace 64 catheter. Once the micro wire microcatheter, and intermediate catheter were advanced to the carotid siphon, the microwire and microcatheter were advanced beyond the occlusion. The wire was removed and a new device was deployed, using EMBO trap 5 x 33, stripping the microcatheter from the wire. After the device was deployed across the occlusion, the Ace catheter was placed at the site of occlusion. The balloon at the balloon guide was inflated to profile of the vessel. Local aspiration was performed at the intermediate catheter upon  withdrawal of the device under fluoroscopic observation, with aspiration at the balloon guide. Once the retrieve her was entirely removed from the system, free aspiration was confirmed at the hub of the intermediate catheter, with free blood return confirmed. Balloon on the balloon guide was deflated. Control angiogram was performed. Additional views were performed with multiple obliquities. TI CI 3 was confirmed. Balloon guide was removed, and the bright tip sheath was removed. Eight [place] Angio-Seal was deployed. Flat panel CT was performed. This was reviewed and the patient was extubated. Patient tolerated the procedure well and remained hemodynamically stable throughout. No complications were encountered. Estimated blood loss approximately 100 cc. IMPRESSION: Status post ultrasound-guided access of right common femoral artery for cervical and cerebral angiogram, with mechanical thrombectomy of right M1 occlusion and restoration of TICI 3 flow after 3 passes. Deployment of Angio-Seal for hemostasis. Signed, Dulcy Fanny. Dellia Nims, RPVI [facility] Radiology PLAN: Right hip straight overnight Target blood pressure 1 78-676 systolic Frequent neurovascular checks. Admission to ICU. Further neuroimaging at the discretion of neurology Electronically Signed   By: Corrie Mckusick D.O.   On: 05/17/2018 18:03   Ir Percutaneous Art Thrombectomy/infusion Intracranial Inc Diag Angio  Result Date: 05/17/2018 INDICATION: 68 year old male with a history of right-sided M1 occlusion, emergent large vessel occlusion EXAM: ULTRASOUND-GUIDED RIGHT COMMON FEMORAL ARTERY ACCESS CERVICAL AND CEREBRAL ANGIOGRAM MECHANICAL THROMBECTOMY RIGHT MCA COMPARISON:  CT imaging same date MEDICATIONS: 2.0 g Ancef. The antibiotic was administered within 1 hour of the procedure ANESTHESIA/SEDATION: General endotracheal tube anesthesia The patient was continuously monitored during the procedure by the  interventional radiology nurse under my direct supervision. CONTRAST:  90 cc FLUOROSCOPY TIME:  Fluoroscopy Time: 38 minutes 48 seconds (2,063 mGy). COMPLICATIONS: None TECHNIQUE: Informed written consent was obtained from the patient's family after a thorough discussion of the procedural risks, benefits and alternatives. Specific risks discussed include: Bleeding, infection, contrast reaction, kidney injury/failure, need for further procedure/surgery, arterial injury or dissection, embolization to new territory, intracranial hemorrhage (10-15% risk), neurologic deterioration, cardiopulmonary collapse, death. All questions were addressed. Maximal Sterile Barrier Technique was utilized including during the procedure including caps, mask, sterile gowns, sterile gloves, sterile drape, hand hygiene and skin antiseptic. A timeout was performed prior to the initiation of the procedure. The anesthesia team was present to provide general endotracheal tube anesthesia and for patient monitoring during the procedure. Interventional neuro radiology nursing staff was also present. FINDINGS: Initial Findings: Right common carotid artery:  Normal course caliber and  contour. Right external carotid artery: Patent with antegrade flow. Right internal carotid artery: Normal course caliber and contour of the cervical portion. Vertical and petrous segment patent with normal course caliber contour. Cavernous segment patent. Clinoid segment patent. Antegrade flow of the ophthalmic artery. Ophthalmic segment patent. Terminus patent. Right MCA:  Right M1 occlusion Right ACA: A 1 segment patent. A 2 segment perfuses the right territory. Completion Findings: Right MCA: Complete restoration of flow through the right MCA TICI 3 Flat panel CT: No hemorrhage PROCEDURE: Patient is brought emergently to the neuro angiography suite, with the patient identified appropriately and placed supine position on the table. Left radial arterial line was placed  by the anesthesia team. The patient is then prepped and draped in the usual sterile fashion. Ultrasound survey of the right inguinal region was performed with images stored and sent to PACs. 11 blade scalpel was used to make a small incision. Blunt dissection was performed. A micropuncture needle was used access the right common femoral artery under ultrasound. With excellent arterial blood flow returned, an .018 micro wire was passed through the needle, observed to enter the abdominal aorta under fluoroscopy. The needle was removed, and a micropuncture sheath was placed over the wire. The inner dilator and wire were removed, and an 035 Bentson wire was advanced under fluoroscopy into the abdominal aorta. The sheath was removed and a standard 5 [place] vascular sheath was placed. The dilator was removed and the sheath was flushed. A 41F JB-1 diagnostic catheter was advanced over the wire to the proximal descending thoracic aorta. Wire was then removed. Double flush of the catheter was performed. Catheter was then used to select the right common carotid artery. Catheter was advanced over the wire into the cervical ICA. Wire was removed. Double flush was performed. Formal angiogram was performed, with roadmap achieved. Exchange length Rosen wire was then passed through the diagnostic catheter to the distal cervical ICA and the diagnostic catheter was removed. The 5 French sheath was removed and exchanged for 8 French 55 centimeter BrightTip sheath. Sheath was flushed and attached to pressurized and heparinized saline bag for constant forward flow. Eight French 95 cm flow gate balloon catheter was then advanced over the wire to the distal cervical segment. Wire was removed. Then a coaxial intermediate catheter and microcatheter combination was prepared on the back table. This combination was CAT-5 catheter and a Trevo Provue18 microcatheter, with a synchro soft wire. This combination was then advanced through the balloon  guide into the ICA. System was advanced into the internal carotid artery, to the level of the occlusion. The micro wire was then carefully advanced through the occluded segment. Microcatheter was then pushed through the occluded segment and the wire was removed. Intermediate catheter was advanced over the micro wire to the M1 segment. Blood was then aspirated through the hub of the microcatheter, and a gentle contrast injection was performed confirming intraluminal position. A rotating hemostatic valve was then attached to the back end of the microcatheter, and a pressurized and heparinized saline bag was attached to the catheter. 4 x 40 solitaire device was then selected. Back flush was achieved at the rotating hemostatic valve, and then the device was gently advanced through the microcatheter to the distal end. The retriever was then unsheathed by withdrawing the microcatheter under fluoroscopy. Once the retriever was completely unsheathed, the microcatheter was carefully stripped from the delivery wire of the device. Control angiogram was then performed from the balloon catheter. 3 minute time interval  was observed. The balloon on the balloon guide was then inflated to profile of the vessel. Constant aspiration was then performed through the intermediate catheter, and constant gentle aspiration was performed at the balloon guide. This aspiration was continued as the retriever was gently and slowly withdrawn with fluoroscopic observation into the distal intermediate catheter. The entire system was then gently withdrawn from the intracranial ICA and into the balloon guide. Once the retriever was entirely removed from the system, free aspiration was confirmed at the hub of the balloon guide, with free blood return confirmed. Control angiogram was then performed. Persistent occlusion at the proximal MCA was confirmed. The microcatheter system was then advanced through the intermediate catheter. Once the micro wire  microcatheter were beyond the occlusion, the micro wire was removed and stentreiver device was deployed, stripping the microcatheter from the wire. After the device was deployed across the occlusion, the intermediate catheter was placed at the site of occlusion. The balloon at the balloon guide was inflated to profile of the vessel. Local aspiration was performed at the intermediate catheter upon withdrawal of the device under fluoroscopic observation, with aspiration at the balloon guide. Once the retrieve was withdrawn to the face of the intermediate catheter, both the microcatheter in intermediate catheter were removed from the system. Free aspiration was confirmed at the hub of the balloon guide catheter, with free blood return confirmed. Control angiogram was again performed. Persistent occlusion was identified. We proceeded with a third attempt. The microcatheter system was then advanced through a new intermediate catheter, using Ace 64 catheter. Once the micro wire microcatheter, and intermediate catheter were advanced to the carotid siphon, the microwire and microcatheter were advanced beyond the occlusion. The wire was removed and a new device was deployed, using EMBO trap 5 x 33, stripping the microcatheter from the wire. After the device was deployed across the occlusion, the Ace catheter was placed at the site of occlusion. The balloon at the balloon guide was inflated to profile of the vessel. Local aspiration was performed at the intermediate catheter upon withdrawal of the device under fluoroscopic observation, with aspiration at the balloon guide. Once the retrieve her was entirely removed from the system, free aspiration was confirmed at the hub of the intermediate catheter, with free blood return confirmed. Balloon on the balloon guide was deflated. Control angiogram was performed. Additional views were performed with multiple obliquities. TI CI 3 was confirmed. Balloon guide was removed, and the  bright tip sheath was removed. Eight [place] Angio-Seal was deployed. Flat panel CT was performed. This was reviewed and the patient was extubated. Patient tolerated the procedure well and remained hemodynamically stable throughout. No complications were encountered. Estimated blood loss approximately 100 cc. IMPRESSION: Status post ultrasound-guided access of right common femoral artery for cervical and cerebral angiogram, with mechanical thrombectomy of right M1 occlusion and restoration of TICI 3 flow after 3 passes. Deployment of Angio-Seal for hemostasis. Signed, Dulcy Fanny. Dellia Nims, RPVI [facility] Radiology PLAN: Right hip straight overnight Target blood pressure 1 53-614 systolic Frequent neurovascular checks. Admission to ICU. Further neuroimaging at the discretion of neurology Electronically Signed   By: Corrie Mckusick D.O.   On: 05/17/2018 18:03   Ct Head Code Stroke Wo Contrast  Result Date: 05/17/2018 CLINICAL DATA:  Code stroke.  Left-sided flaccid.  Facial droop EXAM: CT HEAD WITHOUT CONTRAST TECHNIQUE: Contiguous axial images were obtained from the base of the skull through the vertex without intravenous contrast. COMPARISON:  None. FINDINGS: Brain: Image quality degraded by motion. Ventricle size normal. Negative for hemorrhage or mass. No evidence of acute infarct however sensitivity is decreased due to motion. Vascular: Hyperdense right middle cerebral artery distal right M1 and M2 segments. This is best seen on coronal and sagittal images and is difficult to see on the axial images due to motion. Skull: Negative Sinuses/Orbits: Mild mucosal edema paranasal sinuses. Negative orbit Other: None ASPECTS ([place] Stroke Program Early CT Score) - Ganglionic level infarction (caudate, lentiform nuclei, internal capsule, insula, M1-M3 cortex): 7 - Supraganglionic infarction (M4-M6 cortex): 3 Total score (0-10 with 10 being normal): 10 IMPRESSION: 1.  Hyperdense right MCA suggestive of acute thrombosis. Negative for hemorrhage. No CT evidence of acute infarct however sensitivity is diminished due to considerable motion. 2. ASPECTS is 10 3. These results were called by telephone at the time of interpretation on 05/17/2018 at 1:56 pm to Dr. Leonel Ramsay , who verbally acknowledged these results. Electronically Signed   By: Franchot Gallo M.D.   On: 05/17/2018 13:57    Assessment and Plan:   Atrial fibrillation paroxysmal new onset in the setting of acute right MCA stroke status post TPA and thrombectomy by interventional neuroradiology - Overall good rate control with low-dose diltiazem.  Trying not to decrease his blood pressure and allow for permissive hypertension if possible. - At this point, cannot utilize anticoagulation however when it is reasonable from a neurology standpoint, would recommend Eliquis 5 mg twice a day.  Of course, risk of starting Eliquis now could potentiate hemorrhagic transformation.  Appreciate neurology's guidance.  Getting MRI. -Echocardiogram currently pending to ensure that there is no evidence of other left atrial thrombus or masses.  Regardless, treatment would be to start anticoagulation when possible.  Hyperlipidemia - I would like for him to try Crestor 20 mg once a day instead of pravastatin to give him a high intensity statin.  Willing to accept a mild increase in blood glucose with statin therapy especially in the setting of his current acute stroke.  Previously tried atorvastatin but this was stopped when he noticed his blood glucose increasing. LDL 154.  Also okay with co-Q10 We will follow along.  Wife and son also present for discussion.  For questions or updates, please contact [place] Please consult [email or web address] for contact info under     Signed, Candee Furbish, MD  05/18/2018 10:08 AM

## 2018-05-18 NOTE — Evaluation (Signed)
Physical Therapy Evaluation Patient Details Name: Patrick Salinas MRN: [id number] DOB: 10/21/1950 Today's Date: 05/18/2018   History of Present Illness  68 y.o. male  With PMH HLD, HTN, prostate cancer who  Presented to [facility] ED as a code stroke for c/o left side weakness and gaze deviation.  CT showed right inferior temporal lobe infarct due to right distal M1 occlusion. He was administered TPA and underwent mechanical thrombectomy of right M1 occlusion.     Clinical Impression  Pt admitted with above diagnosis. Pt currently with functional limitations due to the deficits listed below (see PT Problem List). PTA pt active and independent. He lives at home with his wife. He is a retired Engineer, drilling. On eval, he required supervision bed mobility, supervision transfers, and min guard assist ambulation 250 feet without AD. No LOB noted. Pt without c/o pain or dizziness. Strength appears intact and symmetrical. Pt will benefit from skilled PT to increase their independence and safety with mobility to allow discharge to the venue listed below.  PT to follow acutely to further assess stairs and balance. No follow up services indicated.     Follow Up Recommendations No PT follow up    Equipment Recommendations  None recommended by PT    Recommendations for Other Services       Precautions / Restrictions Precautions Precautions: None      Mobility  Bed Mobility Overal bed mobility: Needs Assistance Bed Mobility: Supine to Sit;Sit to Supine     Supine to sit: Supervision;HOB elevated Sit to supine: Supervision;HOB elevated   General bed mobility comments: supervision for safety. No physical assist.   Transfers Overall transfer level: Needs assistance Equipment used: None Transfers: Sit to/from Stand Sit to Stand: Supervision         General transfer comment: supervision for safety. No physical assist.  Ambulation/Gait Ambulation/Gait assistance: Min guard Gait Distance (Feet): 250  Feet Assistive device: None Gait Pattern/deviations: Step-through pattern;Decreased stride length Gait velocity: WFL Gait velocity interpretation: >2.62 ft/sec, indicative of community ambulatory General Gait Details: steady gait. No LOB noted with head turns, pivoting, or maneuvering around objects. Min guard for safety.  Stairs            Wheelchair Mobility    Modified Rankin (Stroke Patients Only) Modified Rankin (Stroke Patients Only) Pre-Morbid Rankin Score: No symptoms Modified Rankin: No symptoms     Balance Overall balance assessment: No apparent balance deficits (not formally assessed)                                           Pertinent Vitals/Pain Pain Assessment: No/denies pain    Home Living Family/patient expects to be discharged to:: Private residence Living Arrangements: Spouse/significant other Available Help at Discharge: Family;Available 24 hours/day Type of Home: House Home Access: Stairs to enter   [employer] of Steps: 1 (threshold) Home Layout: Able to live on main level with bedroom/bathroom Home Equipment: None      Prior Function Level of Independence: Independent               Hand Dominance   Dominant Hand: Right    Extremity/Trunk Assessment   Upper Extremity Assessment Upper Extremity Assessment: Overall WFL for tasks assessed    Lower Extremity Assessment Lower Extremity Assessment: Overall WFL for tasks assessed(strength appears symmetrical and intact. Sensation intact bilat. No c/o numbness. )    Cervical /  Trunk Assessment Cervical / Trunk Assessment: Normal  Communication   Communication: No difficulties  Cognition Arousal/Alertness: Awake/alert Behavior During Therapy: WFL for tasks assessed/performed Overall Cognitive Status: Within Functional Limits for tasks assessed                                        General Comments      Exercises      Assessment/Plan    PT Assessment Patient needs continued PT services  PT Problem List Decreased mobility;Decreased activity tolerance       PT Treatment Interventions Functional mobility training;Balance training;Patient/family education;Gait training;Therapeutic activities;Stair training;Therapeutic exercise    PT Goals (Current goals can be found in the Care Plan section)  Acute Rehab PT Goals Patient Stated Goal: "get back to my list of projects" PT Goal Formulation: With patient/family Time For Goal Achievement: 05/25/18 Potential to Achieve Goals: Good    Frequency Min 4X/week   Barriers to discharge        Co-evaluation               AM-PAC PT "6 Clicks" Mobility  Outcome Measure Help needed turning from your back to your side while in a flat bed without using bedrails?: None Help needed moving from lying on your back to sitting on the side of a flat bed without using bedrails?: None Help needed moving to and from a bed to a chair (including a wheelchair)?: None Help needed standing up from a chair using your arms (e.g., wheelchair or bedside chair)?: None Help needed to walk in hospital room?: A Little Help needed climbing 3-5 steps with a railing? : A Little 6 Click Score: 22    End of Session Equipment Utilized During Treatment: Gait belt Activity Tolerance: Patient tolerated treatment well Patient left: in bed;with call bell/phone within reach;with family/visitor present Nurse Communication: Mobility status PT Visit Diagnosis: Difficulty in walking, not elsewhere classified (R26.2)    Time: 2751-7001 PT Time Calculation (min) (ACUTE ONLY): 25 min   Charges:   PT Evaluation $PT Eval Moderate Complexity: 1 Mod PT Treatments $Gait Training: 8-22 mins        Lorrin Goodell, PT  Office # [phone] Pager [phone]   Lorriane Shire 05/18/2018, 12:59 PM

## 2018-05-19 ENCOUNTER — Inpatient Hospital Stay (HOSPITAL_COMMUNITY): Payer: Medicare Other

## 2018-05-19 DIAGNOSIS — E782 Mixed hyperlipidemia: Secondary | ICD-10-CM

## 2018-05-19 DIAGNOSIS — I361 Nonrheumatic tricuspid (valve) insufficiency: Secondary | ICD-10-CM

## 2018-05-19 DIAGNOSIS — I340 Nonrheumatic mitral (valve) insufficiency: Secondary | ICD-10-CM

## 2018-05-19 LAB — ECHOCARDIOGRAM COMPLETE
Height: 75 in
Weight: 3393.32 oz

## 2018-05-19 LAB — BASIC METABOLIC PANEL
Anion gap: 11 (ref 5–15)
BUN: 15 mg/dL (ref 8–23)
CO2: 24 mmol/L (ref 22–32)
Calcium: 8.7 mg/dL — ABNORMAL LOW (ref 8.9–10.3)
Chloride: 102 mmol/L (ref 98–111)
Creatinine, Ser: 0.97 mg/dL (ref 0.61–1.24)
GFR calc Af Amer: >60 mL/min (ref >60)
GFR calc non Af Amer: >60 mL/min (ref >60)
Glucose, Bld: 122 mg/dL — ABNORMAL HIGH (ref 70–99)
Potassium: 4.7 mmol/L (ref 3.5–5.1)
Sodium: 137 mmol/L (ref 135–145)

## 2018-05-19 LAB — CBC
HEMATOCRIT: 42.2 % (ref 39.0–52.0)
Hemoglobin: 14.1 g/dL (ref 13.0–17.0)
MCH: 30.6 pg (ref 26.0–34.0)
MCHC: 33.4 g/dL (ref 30.0–36.0)
MCV: 91.5 fL (ref 80.0–100.0)
Platelets: 205 10*3/uL (ref 150–400)
RBC: 4.61 MIL/uL (ref 4.22–5.81)
RDW: 13.5 % (ref 11.5–15.5)
WBC: 8.9 10*3/uL (ref 4.0–10.5)
nRBC: 0 % (ref 0.0–0.2)

## 2018-05-19 MED ORDER — APIXABAN 5 MG PO TABS
5.0000 mg | ORAL_TABLET | Freq: Two times a day (BID) | ORAL | Status: DC
  Administered 2018-05-19 – 2018-05-20 (×3): 5 mg via ORAL
  Filled 2018-05-19 (×3): qty 1

## 2018-05-19 MED ORDER — DILTIAZEM HCL ER COATED BEADS 120 MG PO CP24
120.0000 mg | ORAL_CAPSULE | Freq: Every day | ORAL | Status: DC
  Administered 2018-05-19 – 2018-05-20 (×2): 120 mg via ORAL
  Filled 2018-05-19 (×3): qty 1

## 2018-05-19 NOTE — Progress Notes (Signed)
  Echocardiogram 2D Echocardiogram has been performed.  Patrick Salinas 05/19/2018, 10:08 AM

## 2018-05-19 NOTE — Discharge Summary (Addendum)
Patient ID: Patrick Salinas   MRN: [id number]      DOB: March 01, 1951  Date of Admission: 05/17/2018 Date of Discharge: 05/20/2018  Attending Physician:  Rosalin Hawking, MD, Stroke MD Consultant(s):   Dr Marlou Porch ( cardiology ), Corrie Mckusick, DO (Interventional Neuroradiologist) Patient's PCP:  [name], MD  DISCHARGE DIAGNOSIS:  Principal Problem:   Embolic stroke involving right middle cerebral artery ([place]) s/p tPA and mechanical thrombectomy, d/t AF Active Problems:   Atrial fibrillation ([place])   Hyperlipidemia LDL goal <70   Essential hypertension   Past Medical History:  Diagnosis Date  . Cancer [facility])    Prostate cancer -dx 5'16  . Hypercholesterolemia    no meds due to adverse reaction   Past Surgical History:  Procedure Laterality Date  . BIOPSY PROSTATE     5'16  . CERVICAL FUSION    . COLONOSCOPY W/ POLYPECTOMY     x2 benign  . IR CT HEAD LTD  05/17/2018  . IR PERCUTANEOUS ART THROMBECTOMY/INFUSION INTRACRANIAL INC DIAG ANGIO  05/17/2018  . IR US GUIDE VASC ACCESS RIGHT  05/17/2018  . RADIOLOGY WITH ANESTHESIA N/A 05/17/2018   Procedure: IR WITH ANESTHESIA;  Surgeon: Corrie Mckusick, DO;  Location: [facility];  Service: Anesthesiology;  Laterality: N/A;  . ROBOT ASSISTED LAPAROSCOPIC RADICAL PROSTATECTOMY N/A 11/02/2014   Procedure: ROBOTIC ASSISTED LAPAROSCOPIC RADICAL PROSTATECTOMY LEVEL 1;  Surgeon: Raynelle Bring, MD;  Location: [facility];  Service: Urology;  Laterality: N/A;  . TONSILLECTOMY      Allergies as of 05/20/2018      Reactions   Hctz [hydrochlorothiazide] Other (See Comments)   myalgia   Lipitor [atorvastatin Calcium] Other (See Comments)   Blood sugar rises, Myalgia      Medication List    STOP taking these medications   ibuprofen 200 MG tablet Commonly known as:  ADVIL,MOTRIN     TAKE these medications   apixaban 5 MG Tabs tablet Commonly known as:  ELIQUIS Take 1 tablet (5 mg total) by mouth 2 (two) times daily.   diltiazem 120 MG 24 hr  capsule Commonly known as:  CARDIZEM CD Take 1 capsule (120 mg total) by mouth daily. Start taking on:  May 21, 2018   HYDROcodone-acetaminophen 5-325 MG tablet Commonly known as:  NORCO Take 1-2 tablets by mouth every 6 (six) hours as needed.   lisinopril 20 MG tablet Commonly known as:  PRINIVIL,ZESTRIL Take 20 mg by mouth daily.   rosuvastatin 20 MG tablet Commonly known as:  CRESTOR Take 1 tablet (20 mg total) by mouth daily at 6 PM.       HOME MEDICATIONS PRIOR TO ADMISSION Medications Prior to Admission  Medication Sig Dispense Refill  . ibuprofen (ADVIL,MOTRIN) 200 MG tablet Take 200 mg by mouth every 6 (six) hours as needed.    Marland Kitchen lisinopril (PRINIVIL,ZESTRIL) 20 MG tablet Take 20 mg by mouth daily.    Marland Kitchen HYDROcodone-acetaminophen (NORCO) 5-325 MG per tablet Take 1-2 tablets by mouth every 6 (six) hours as needed. (Patient not taking: Reported on 05/18/2018) 30 tablet [place] . apixaban  5 mg Oral BID  . diltiazem  120 mg Oral Daily  . pantoprazole  40 mg Oral Daily  . rosuvastatin  20 mg Oral q1800  . sodium chloride flush  3 mL Intravenous Once    LABORATORY STUDIES CBC    Component Value Date/Time   WBC 8.4 05/20/2018 0541   RBC 4.87 05/20/2018 0541  HGB 14.6 05/20/2018 0541   HCT 44.2 05/20/2018 0541   PLT 212 05/20/2018 0541   MCV 90.8 05/20/2018 0541   MCH 30.0 05/20/2018 0541   MCHC 33.0 05/20/2018 0541   RDW 13.2 05/20/2018 0541   LYMPHSABS 1.7 05/17/2018 1340   MONOABS 0.6 05/17/2018 1340   EOSABS 0.3 05/17/2018 1340   BASOSABS 0.1 05/17/2018 1340   CMP    Component Value Date/Time   NA 137 05/20/2018 0541   K 4.6 05/20/2018 0541   CL 102 05/20/2018 0541   CO2 26 05/20/2018 0541   GLUCOSE 106 (H) 05/20/2018 0541   BUN 16 05/20/2018 0541   CREATININE 0.97 05/20/2018 0541   CALCIUM 8.9 05/20/2018 0541   PROT 6.7 05/17/2018 1340   ALBUMIN 4.1 05/17/2018 1340   AST 23 05/17/2018 1340   ALT 21 05/17/2018 1340    ALKPHOS 61 05/17/2018 1340   BILITOT 0.8 05/17/2018 1340   GFRNONAA >60 05/20/2018 0541   GFRAA >60 05/20/2018 0541   COAGS Lab Results  Component Value Date   INR 1.06 05/17/2018   Lipid Panel    Component Value Date/Time   CHOL 217 (H) 05/18/2018 0617   TRIG 66 05/18/2018 0617   HDL 50 05/18/2018 0617   CHOLHDL 4.3 05/18/2018 0617   VLDL 13 05/18/2018 0617   LDLCALC 154 (H) 05/18/2018 0617   HgbA1C  Lab Results  Component Value Date   HGBA1C 5.7 (H) 05/18/2018    SIGNIFICANT DIAGNOSTIC STUDIES Ct Head Code Stroke Wo Contrast 05/17/2018 1. Hyperdense right MCA suggestive of acute thrombosis. Negative for hemorrhage. No CT evidence of acute infarct however sensitivity is diminished due to considerable motion.  2. ASPECTS is 10   Ct Angio Head W Or Wo Contrast Ct Angio Neck W Or Wo Contrast 05/17/2018 1. Acute occlusion distal right M1 segment extending into the right M2 segments. This is compatible with embolus given the history of atrial fibrillation. There is flow distal to the embolus due to collateral circulation. No other intracranial occlusion.  2. No significant carotid or vertebral artery stenosis in the neck   [place] 05/17/2018 Status post ultrasound-guided access of right common femoral artery for cervical and cerebral angiogram, with mechanical thrombectomy of right M1 occlusion and restoration of TICI 3 flow after 3 passes. Deployment of Angio-Seal for hemostasis.  PLAN:  Right hip straight overnight Target blood pressure 329-924 systolic Frequent neurovascular checks. Admission to ICU. Further neuroimaging at the discretion of neurology   Ct Head Wo Contrast 05/17/2018 Subtle area of hypodensity in the right inferior temporal lobe appears new and likely represents an area of acute infarct. Negative for hemorrhage.  MRI Head WO Contrast  05/18/2018 Acute multifocal small RIGHT MCA territory and RIGHT posterior border zone nonhemorrhagic  infarcts.  MRA HEAD 05/18/2018 1. Status post successful endovascular revascularization of RIGHT M1 occlusion; no emergent large vessel occlusion. 2. Poor flow related enhancement LEFT M2 segment favored as artifact, less likely new stenosis.  Transthoracic Echocardiogram  05/19/2018 1. The left ventricle has normal systolic function, with an ejection fraction of 55-60%. The cavity size was normal. There is moderately increased left ventricular wall thickness. Left ventricular diastolic Doppler parameters are indeterminate. No evidence of left ventricular regional wall motion abnormalities. 2. The right ventricle has normal systolic function. The cavity was normal. There is no increase in right ventricular wall thickness. 3. The aortic valve is tricuspid. Mild calcification of the aortic valve.. Mild aortic annular calcification noted. 4. The  mitral valve is normal in structure. There is mild mitral regurgitation. 5. The tricuspid valve is normal in structure. 6. The inferior vena cava was normal in size with <50% respiratory variability. 7. No atrial level shunt detected by colo flow Doppler.  EKG - afib - vetricular response 113 BPM     HISTORY OF PRESENT ILLNESS Patrick Salinas is an 68 y.o. male  With PMH HLD, HTN, prostate cancer who presented to [facility] ED as a code stroke for c/o left side weakness and gaze deviation. 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 Was at home with his wife ( a retired Lexicographer) while they were talking She noticed and at about 1310 he had a sudden onset of left side flaccid, facial droop and right gaze. He began leaning to the left. She called 911. Denies any blood thinners. No prior stroke history. [place] in ED showed no hemorrhage. CTA showed an occlusion of distal right M1. He was last known well 05-17-2018 at 1310. Modified Rankin Score=0. NIHSS:6. IV tPA was administered. He was sent to IR for mechanical thrombectomy.    HOSPITAL COURSE Dr. Daryel Gerald is a 68 y.o. male with history of HLD, HTN, prostate cancer  presenting with left side weakness and right gaze deviation. Improved in ED. tPA Given @ 5852 Friday 05/17/2018. Taken to interventional radiology for mechanical thrombectomy of right M1 occlusion with restoration of TICI 3 flow after 3 passes. He was admitted to the ICU.   Stroke:  right inferior temporal lobe infarct due to right distal M1 occlusion s/p tPA and IR with TICI3 reperfusion - embolic pattern - due to new diagnosis of atrial fibrillation  CT head - hyperdense right MCA suggestive of acute thrombosis.   CTA H&N - Acute occlusion distal right M1 segment extending into the right M2 segments.  Cerebral angio mechanical thrombectomy of right M1 occlusion and restoration of TICI 3 flow after 3 passes. Deployment of Angio-Seal for hemostasis.   Post IR CT  MRI head - Acute multifocal small RIGHT MCA territory and RIGHT posterior border zone nonhemorrhagic infarcts.  MRA head - Poor flow related enhancement LEFT M2 segment favored as artifact, less likely new stenosis.  2D Echo  - EF 55 - 60%. No cardiac source of emboli identified.   LDL - 154  HgbA1c - 5.7  No antithrombotic prior to admission, started on Eliquis in hospital post tPA for AF  Therapy recommendations:  No further therapy recommended   Disposition:  return home w/ wife  Afib RVR, new diagnosis  EKG on presentation showed afib  Treated with cardizem   Dr. Marlou Porch consulted   Plans cardioversion is one month if not spontaneously converted  Started on Eliquis for secondary stroke prevention  Hypertension  Stable  Resumed home lisinopril  BP goal normotensive at time of d/c  Hyperlipidemia  Lipid lowering medication PTA: none  LDL 154, goal < 70  Intolerance to Lipitor in the past  Current lipid lowering medication: Crestor 20  Consider add coQ10 at home  Continue statin at discharge  Other Stroke Risk  Factors  Advanced age  Other Active Problems  Prostate cancer  Elevated TG   DISCHARGE EXAM Vitals:   05/19/18 2351 05/20/18 0325 05/20/18 0719 05/20/18 1251  BP: 128/90 (!) 135/92 (!) 141/101 (!) 150/103  Pulse: (!) 48 80 80 82  Resp:  18 18 18   Temp: 97.9 F (36.6 C) 98.1 F (36.7 C) 98.4 F (36.9 C)   TempSrc: Oral  Oral Oral   SpO2: 96% 97% 96% 94%  Weight:      Height:       General - Well nourished, well developed, in no apparent distress.  Cardiovascular - irregularly irregular heart rate and rhythm, afib RVR on tele  Mental Status -  Level of arousal and orientation to time, place, and person were intact. Language including expression, naming, repetition, comprehension was assessed and found intact. Fund of Knowledge was assessed and was intact.  Cranial Nerves II - XII - II - Visual field intact OU. III, IV, VI - Extraocular movements intact. V - Facial sensation intact bilaterally. VII - Facial movement intact bilaterally. VIII - Hearing & vestibular intact bilaterally X - Palate elevates symmetrically. XI - Chin turning & shoulder shrug intact bilaterally. XII - Tongue protrusion intact.  Motor Strength - The patient's strength was normal in all extremities and pronator drift was absent.  Bulk was normal and fasciculations were absent.   Motor Tone - Muscle tone was assessed at the neck and appendages and was normal.  Reflexes - The patient's reflexes were symmetrical in all extremities and he had no pathological reflexes.  Sensory - Light touch, temperature/pinprick were assessed and were symmetrical.    Coordination - The patient had normal movements in the hands and feet with no ataxia or dysmetria.  Tremor was absent.  Gait and Station - deferred.  Discharge Diet   Heart healthy thin liquids  DISCHARGE PLAN  Disposition:  Home w/ wife  No therapy needs  Eliquis (apixaban) daily for secondary stroke prevention.  Anticipate  cardioversion in 1 month if not resolved. Will follow up with Dr. Marlou Porch. Anticipated f/u March 19. His office is scheduling  Ongoing risk factor control by Primary Care Physician at time of discharge  Follow-up [name], MD in 2 weeks.  Follow-up in [facility] in 6 weeks, office to schedule an appointment.   40 minutes were spent preparing discharge.  Burnetta Sabin, MSN, APRN, ANVP-BC, AGPCNP-BC Advanced Practice Stroke Nurse [place] for Schedule & Pager information 05/20/2018 1:39 PM   ATTENDING NOTE: I reviewed above note and agree with the assessment and plan. Pt was seen and examined.   No acute event overnight.  Patient neuro intact.  Has been on Eliquis since yesterday, tolerating well.  Discussed with Dr. Marlou Porch, after 1 months on Eliquis, he will be reevaluated by cardiology to consider cardioversion if needed.  We will continue Eliquis on discharge along with Crestor 20.  Recommend CoQ10 at home.  Follow-up with stroke clinic at [facility] in 4 weeks.  Follow-up with cardiology as scheduled.  Stroke risk factor modification and compliant with medication.  Patient ready for discharge home today.  Rosalin Hawking, MD PhD Stroke Neurology 05/20/2018 4:58 PM

## 2018-05-19 NOTE — Discharge Instructions (Addendum)

## 2018-05-19 NOTE — Progress Notes (Signed)
Progress Note  Patient Name: Patrick Salinas Date of Encounter: 05/19/2018  Primary Cardiologist: Candee Furbish, MD   Subjective   Overall doing well without any chest pain fevers chills nausea vomiting syncope bleeding.  No shortness of breath. Doing well.  No residual stroke symptoms.  Inpatient Medications    Scheduled Meds: . apixaban  5 mg Oral BID  . diltiazem  30 mg Oral Q6H  . pantoprazole  40 mg Oral Daily  . rosuvastatin  20 mg Oral q1800  . sodium chloride flush  3 mL Intravenous Once   Continuous Infusions: . diltiazem (CARDIZEM) infusion     PRN Meds: acetaminophen **OR** [DISCONTINUED] acetaminophen (TYLENOL) oral liquid 160 mg/5 mL **OR** [DISCONTINUED] acetaminophen, senna-docusate   Vital Signs    Vitals:   05/18/18 2009 05/18/18 2352 05/19/18 0410 05/19/18 0819  BP: 128/86 (!) 126/91 136/87 136/86  Pulse: 72 71 67 76  Resp: 20 18 18 20   Temp: 98.4 F (36.9 C) 97.6 F (36.4 C) 98 F (36.7 C) 98.1 F (36.7 C)  TempSrc: Oral Oral Oral Oral  SpO2: 97% 98% 96% 99%  Weight:      Height:        Intake/Output Summary (Last 24 hours) at 05/19/2018 0855 Last data filed at 05/19/2018 0429 Gross per 24 hour  Intake 498.14 ml  Output 1125 ml  Net -626.86 ml   Last 3 Weights 05/17/2018 05/17/2018 11/02/2014  Weight (lbs) 212 lb 1.3 oz 211 lb 10.3 oz 198 lb  Weight (kg) 96.2 kg 96 kg 89.812 kg      Telemetry    Atrial fibrillation under good rate control in the 80s- Personally Reviewed  ECG    Atrial fibrillation- Personally Reviewed  Physical Exam   GEN: No acute distress.   Neck: No JVD Cardiac: Accurately irregular normal rate, no murmurs, rubs, or gallops.  Respiratory: Clear to auscultation bilaterally. GI: Soft, nontender, non-distended  MS: No edema; No deformity. Neuro:  Nonfocal  Psych: Normal affect   Labs    Chemistry Recent Labs  Lab 05/17/18 1340 05/17/18 1353 05/18/18 0617 05/19/18 0415  NA 139  --  138 137  K 3.9  --   4.0 4.7  CL 105  --  106 102  CO2 28  --  20* 24  GLUCOSE 60*  --  162* 122*  BUN 14  --  12 15  CREATININE 0.93 0.90 1.00 0.97  CALCIUM 9.4  --  8.5* 8.7*  PROT 6.7  --   --   --   ALBUMIN 4.1  --   --   --   AST 23  --   --   --   ALT 21  --   --   --   ALKPHOS 61  --   --   --   BILITOT 0.8  --   --   --   GFRNONAA >60  --  >60 >60  GFRAA >60  --  >60 >60  ANIONGAP 6  --  12 11     Hematology Recent Labs  Lab 05/17/18 1340 05/18/18 0617 05/19/18 0415  WBC 6.4 9.8 8.9  RBC 5.39 4.77 4.61  HGB 16.0 14.6 14.1  HCT 49.2 43.5 42.2  MCV 91.3 91.2 91.5  MCH 29.7 30.6 30.6  MCHC 32.5 33.6 33.4  RDW 13.0 13.2 13.5  PLT 246 242 205    Cardiac EnzymesNo results for input(s): TROPONINI in the last 168 hours. No results for input(s):  TROPIPOC in the last 168 hours.   BNPNo results for input(s): BNP, PROBNP in the last 168 hours.   DDimer No results for input(s): DDIMER in the last 168 hours.   Radiology    Ct Angio Head W Or Wo Contrast  Result Date: 05/17/2018 CLINICAL DATA:  Stroke.  Left-sided weakness.  Atrial fibrillation. EXAM: CT ANGIOGRAPHY HEAD AND NECK TECHNIQUE: Multidetector CT imaging of the head and neck was performed using the standard protocol during bolus administration of intravenous contrast. Multiplanar CT image reconstructions and MIPs were obtained to evaluate the vascular anatomy. Carotid stenosis measurements (when applicable) are obtained utilizing NASCET criteria, using the distal internal carotid diameter as the denominator. CONTRAST:  12mL ISOVUE-370 IOPAMIDOL (ISOVUE-370) INJECTION 76% COMPARISON:  CT head 05/17/2018 FINDINGS: CTA NECK FINDINGS Aortic arch: Standard branching. Imaged portion shows no evidence of aneurysm or dissection. No significant stenosis of the major arch vessel origins. Right carotid system: Widely patent without stenosis. Left carotid system: Widely patent without stenosis. Vertebral arteries: Both vertebral arteries widely  patent without stenosis. Skeleton: ACDF with solid fusion C4-5. No acute skeletal abnormality. Other neck: Negative Upper chest: Negative Review of the MIP images confirms the above findings CTA HEAD FINDINGS Anterior circulation: Right cavernous carotid widely patent. Right anterior cerebral artery widely patent. Abrupt occlusion distal right M1 segment with clot in the distal right M1 segment and proximal M2 segments compatible with embolus. This corresponds to hyperdense right MCA on CT. Embolus extends into the inferior division right MCA with flow distal to the occlusion due to collaterals. There is also embolus in the superior division of the right MCA. Cavernous carotid widely patent. Anterior and middle cerebral arteries widely patent. Posterior circulation: Both vertebral arteries patent to the basilar. PICA patent bilaterally. Basilar widely patent. Superior cerebellar and posterior cerebral arteries patent bilaterally without stenosis. Patent posterior communicating artery bilaterally. Venous sinuses: Not well visualized due to arterial phase Anatomic variants: None Delayed phase: Not performed Review of the MIP images confirms the above findings IMPRESSION: 1. Acute occlusion distal right M1 segment extending into the right M2 segments. This is compatible with embolus given the history of atrial fibrillation. There is flow distal to the embolus due to collateral circulation. No other intracranial occlusion. 2. No significant carotid or vertebral artery stenosis in the neck 3. These results were called by telephone at the time of interpretation on 05/17/2018 at 2:12 pm to Dr. Leonel Ramsay , who verbally acknowledged these results. Electronically Signed   By: Franchot Gallo M.D.   On: 05/17/2018 14:13   Ct Head Wo Contrast  Result Date: 05/17/2018 CLINICAL DATA:  Stroke. Post tPA. Worsening neurologic examination. EXAM: CT HEAD WITHOUT CONTRAST TECHNIQUE: Contiguous axial images were obtained from the  base of the skull through the vertex without intravenous contrast. COMPARISON:  CT head 05/17/2018 FINDINGS: Brain: Ill-defined hypodensity in the right inferior temporal lobe. Given the prior right MCA occlusion, this likely represents acute infarct. This is not visualized on the CT from earlier today however the prior study was degraded by motion. Never less, this does appear to be a new finding. No associated hemorrhage. Ventricle size remains normal. Vascular: Vascular contrast due to CTA Skull: Negative Sinuses/Orbits: Mild mucosal edema paranasal sinuses. Other: None IMPRESSION: Subtle area of hypodensity in the right inferior temporal lobe appears new and likely represents an area of acute infarct. Negative for hemorrhage. Images were reviewed with Dr. Leonel Ramsay at 1540 hours. Electronically Signed   By: Franchot Gallo M.D.  On: 05/17/2018 16:10   Ct Angio Neck W Or Wo Contrast  Result Date: 05/17/2018 CLINICAL DATA:  Stroke.  Left-sided weakness.  Atrial fibrillation. EXAM: CT ANGIOGRAPHY HEAD AND NECK TECHNIQUE: Multidetector CT imaging of the head and neck was performed using the standard protocol during bolus administration of intravenous contrast. Multiplanar CT image reconstructions and MIPs were obtained to evaluate the vascular anatomy. Carotid stenosis measurements (when applicable) are obtained utilizing NASCET criteria, using the distal internal carotid diameter as the denominator. CONTRAST:  2mL ISOVUE-370 IOPAMIDOL (ISOVUE-370) INJECTION 76% COMPARISON:  CT head 05/17/2018 FINDINGS: CTA NECK FINDINGS Aortic arch: Standard branching. Imaged portion shows no evidence of aneurysm or dissection. No significant stenosis of the major arch vessel origins. Right carotid system: Widely patent without stenosis. Left carotid system: Widely patent without stenosis. Vertebral arteries: Both vertebral arteries widely patent without stenosis. Skeleton: ACDF with solid fusion C4-5. No acute skeletal  abnormality. Other neck: Negative Upper chest: Negative Review of the MIP images confirms the above findings CTA HEAD FINDINGS Anterior circulation: Right cavernous carotid widely patent. Right anterior cerebral artery widely patent. Abrupt occlusion distal right M1 segment with clot in the distal right M1 segment and proximal M2 segments compatible with embolus. This corresponds to hyperdense right MCA on CT. Embolus extends into the inferior division right MCA with flow distal to the occlusion due to collaterals. There is also embolus in the superior division of the right MCA. Cavernous carotid widely patent. Anterior and middle cerebral arteries widely patent. Posterior circulation: Both vertebral arteries patent to the basilar. PICA patent bilaterally. Basilar widely patent. Superior cerebellar and posterior cerebral arteries patent bilaterally without stenosis. Patent posterior communicating artery bilaterally. Venous sinuses: Not well visualized due to arterial phase Anatomic variants: None Delayed phase: Not performed Review of the MIP images confirms the above findings IMPRESSION: 1. Acute occlusion distal right M1 segment extending into the right M2 segments. This is compatible with embolus given the history of atrial fibrillation. There is flow distal to the embolus due to collateral circulation. No other intracranial occlusion. 2. No significant carotid or vertebral artery stenosis in the neck 3. These results were called by telephone at the time of interpretation on 05/17/2018 at 2:12 pm to Dr. Leonel Ramsay , who verbally acknowledged these results. Electronically Signed   By: Franchot Gallo M.D.   On: 05/17/2018 14:13   Mr Jodene Nam Head Wo Contrast  Result Date: 05/18/2018 CLINICAL DATA:  Follow-up stroke after endovascular revascularization of RIGHT M1 occlusion. History of atrial fibrillation, hypercholesterolemia and prostate cancer. EXAM: MRI HEAD WITHOUT CONTRAST MRA HEAD WITHOUT CONTRAST TECHNIQUE:  Multiplanar, multiecho pulse sequences of the brain and surrounding structures were obtained without intravenous contrast. Angiographic images of the head were obtained using MRA technique without contrast. COMPARISON:  CT and CTA HEAD May 17, 2018 FINDINGS: MRI HEAD FINDINGS INTRACRANIAL CONTENTS: Patchy areas of RIGHT frontal, temporal and parietal lobe discontinuous reduced diffusion including RIGHT operculum and insula with low ADC values and faint increased FLAIR T2 hyperintense signal. No susceptibility artifact to suggest hemorrhage. No midline shift, mass effect or masses. No parenchymal brain volume loss for age. No hydrocephalus. No abnormal extra-axial fluid collections. VASCULAR: Major intracranial vascular flow voids present at skull base. Dolichoectasia seen with chronic hypertension. SKULL AND UPPER CERVICAL SPINE: No abnormal sellar expansion. No suspicious calvarial bone marrow signal. Severe C3-4 spondylosis. Craniocervical junction maintained. SINUSES/ORBITS: Trace mastoid effusions with trace paranasal sinus mucosal thickening.The included ocular globes and orbital contents are non-suspicious. OTHER: None. MRA  HEAD FINDINGS ANTERIOR CIRCULATION: Normal flow related enhancement of the included cervical, petrous, cavernous and supraclinoid internal carotid arteries. Patent anterior communicating artery. Patent anterior and middle cerebral arteries. Short segment of partial decreased flow related enhancement LEFT proximal M2 associated with pulsation artifact. No large vessel occlusion, flow limiting stenosis, aneurysm. POSTERIOR CIRCULATION: Codominant vertebral arteries. Vertebrobasilar arteries are patent, with normal flow related enhancement of the main branch vessels. Patent posterior cerebral arteries. Robust bilateral posterior communicating arteries, fetal origin on the RIGHT. No large vessel occlusion, flow limiting stenosis,  aneurysm. ANATOMIC VARIANTS: None. Source images and MIP  images were reviewed. IMPRESSION: MRI HEAD: 1. Acute multifocal small RIGHT MCA territory and RIGHT posterior border zone nonhemorrhagic infarcts. MRA HEAD: 1. Status post successful endovascular revascularization of RIGHT M1 occlusion; no emergent large vessel occlusion. 2. Poor flow related enhancement LEFT M2 segment favored as artifact, less likely new stenosis. Electronically Signed   By: Elon Alas M.D.   On: 05/18/2018 13:08   Mr Brain Wo Contrast  Result Date: 05/18/2018 CLINICAL DATA:  Follow-up stroke after endovascular revascularization of RIGHT M1 occlusion. History of atrial fibrillation, hypercholesterolemia and prostate cancer. EXAM: MRI HEAD WITHOUT CONTRAST MRA HEAD WITHOUT CONTRAST TECHNIQUE: Multiplanar, multiecho pulse sequences of the brain and surrounding structures were obtained without intravenous contrast. Angiographic images of the head were obtained using MRA technique without contrast. COMPARISON:  CT and CTA HEAD May 17, 2018 FINDINGS: MRI HEAD FINDINGS INTRACRANIAL CONTENTS: Patchy areas of RIGHT frontal, temporal and parietal lobe discontinuous reduced diffusion including RIGHT operculum and insula with low ADC values and faint increased FLAIR T2 hyperintense signal. No susceptibility artifact to suggest hemorrhage. No midline shift, mass effect or masses. No parenchymal brain volume loss for age. No hydrocephalus. No abnormal extra-axial fluid collections. VASCULAR: Major intracranial vascular flow voids present at skull base. Dolichoectasia seen with chronic hypertension. SKULL AND UPPER CERVICAL SPINE: No abnormal sellar expansion. No suspicious calvarial bone marrow signal. Severe C3-4 spondylosis. Craniocervical junction maintained. SINUSES/ORBITS: Trace mastoid effusions with trace paranasal sinus mucosal thickening.The included ocular globes and orbital contents are non-suspicious. OTHER: None. MRA HEAD FINDINGS ANTERIOR CIRCULATION: Normal flow related  enhancement of the included cervical, petrous, cavernous and supraclinoid internal carotid arteries. Patent anterior communicating artery. Patent anterior and middle cerebral arteries. Short segment of partial decreased flow related enhancement LEFT proximal M2 associated with pulsation artifact. No large vessel occlusion, flow limiting stenosis, aneurysm. POSTERIOR CIRCULATION: Codominant vertebral arteries. Vertebrobasilar arteries are patent, with normal flow related enhancement of the main branch vessels. Patent posterior cerebral arteries. Robust bilateral posterior communicating arteries, fetal origin on the RIGHT. No large vessel occlusion, flow limiting stenosis,  aneurysm. ANATOMIC VARIANTS: None. Source images and MIP images were reviewed. IMPRESSION: MRI HEAD: 1. Acute multifocal small RIGHT MCA territory and RIGHT posterior border zone nonhemorrhagic infarcts. MRA HEAD: 1. Status post successful endovascular revascularization of RIGHT M1 occlusion; no emergent large vessel occlusion. 2. Poor flow related enhancement LEFT M2 segment favored as artifact, less likely new stenosis. Electronically Signed   By: Elon Alas M.D.   On: 05/18/2018 13:08   [place]  Result Date: 05/17/2018 INDICATION: 68 year old male with a history of right-sided M1 occlusion, emergent large vessel occlusion EXAM: ULTRASOUND-GUIDED RIGHT COMMON FEMORAL ARTERY ACCESS CERVICAL AND CEREBRAL ANGIOGRAM MECHANICAL THROMBECTOMY RIGHT MCA COMPARISON:  CT imaging same date MEDICATIONS: 2.0 g Ancef. The antibiotic was administered within 1 hour of the procedure ANESTHESIA/SEDATION: General endotracheal tube anesthesia The patient was continuously monitored during  the procedure by the interventional radiology nurse under my direct supervision. CONTRAST:  90 cc FLUOROSCOPY TIME:  Fluoroscopy Time: 38 minutes 48 seconds (2,063 mGy). COMPLICATIONS: None TECHNIQUE: Informed written consent was obtained from the patient's family  after a thorough discussion of the procedural risks, benefits and alternatives. Specific risks discussed include: Bleeding, infection, contrast reaction, kidney injury/failure, need for further procedure/surgery, arterial injury or dissection, embolization to new territory, intracranial hemorrhage (10-15% risk), neurologic deterioration, cardiopulmonary collapse, death. All questions were addressed. Maximal Sterile Barrier Technique was utilized including during the procedure including caps, mask, sterile gowns, sterile gloves, sterile drape, hand hygiene and skin antiseptic. A timeout was performed prior to the initiation of the procedure. The anesthesia team was present to provide general endotracheal tube anesthesia and for patient monitoring during the procedure. Interventional neuro radiology nursing staff was also present. FINDINGS: Initial Findings: Right common carotid artery:  Normal course caliber and contour. Right external carotid artery: Patent with antegrade flow. Right internal carotid artery: Normal course caliber and contour of the cervical portion. Vertical and petrous segment patent with normal course caliber contour. Cavernous segment patent. Clinoid segment patent. Antegrade flow of the ophthalmic artery. Ophthalmic segment patent. Terminus patent. Right MCA:  Right M1 occlusion Right ACA: A 1 segment patent. A 2 segment perfuses the right territory. Completion Findings: Right MCA: Complete restoration of flow through the right MCA TICI 3 Flat panel CT: No hemorrhage PROCEDURE: Patient is brought emergently to the neuro angiography suite, with the patient identified appropriately and placed supine position on the table. Left radial arterial line was placed by the anesthesia team. The patient is then prepped and draped in the usual sterile fashion. Ultrasound survey of the right inguinal region was performed with images stored and sent to PACs. 11 blade scalpel was used to make a small incision.  Blunt dissection was performed. A micropuncture needle was used access the right common femoral artery under ultrasound. With excellent arterial blood flow returned, an .018 micro wire was passed through the needle, observed to enter the abdominal aorta under fluoroscopy. The needle was removed, and a micropuncture sheath was placed over the wire. The inner dilator and wire were removed, and an 035 Bentson wire was advanced under fluoroscopy into the abdominal aorta. The sheath was removed and a standard 5 [place] vascular sheath was placed. The dilator was removed and the sheath was flushed. A 56F JB-1 diagnostic catheter was advanced over the wire to the proximal descending thoracic aorta. Wire was then removed. Double flush of the catheter was performed. Catheter was then used to select the right common carotid artery. Catheter was advanced over the wire into the cervical ICA. Wire was removed. Double flush was performed. Formal angiogram was performed, with roadmap achieved. Exchange length Rosen wire was then passed through the diagnostic catheter to the distal cervical ICA and the diagnostic catheter was removed. The 5 French sheath was removed and exchanged for 8 French 55 centimeter BrightTip sheath. Sheath was flushed and attached to pressurized and heparinized saline bag for constant forward flow. Eight French 95 cm flow gate balloon catheter was then advanced over the wire to the distal cervical segment. Wire was removed. Then a coaxial intermediate catheter and microcatheter combination was prepared on the back table. This combination was CAT-5 catheter and a Trevo Provue18 microcatheter, with a synchro soft wire. This combination was then advanced through the balloon guide into the ICA. System was advanced into the internal carotid artery, to the level of  the occlusion. The micro wire was then carefully advanced through the occluded segment. Microcatheter was then pushed through the occluded segment and  the wire was removed. Intermediate catheter was advanced over the micro wire to the M1 segment. Blood was then aspirated through the hub of the microcatheter, and a gentle contrast injection was performed confirming intraluminal position. A rotating hemostatic valve was then attached to the back end of the microcatheter, and a pressurized and heparinized saline bag was attached to the catheter. 4 x 40 solitaire device was then selected. Back flush was achieved at the rotating hemostatic valve, and then the device was gently advanced through the microcatheter to the distal end. The retriever was then unsheathed by withdrawing the microcatheter under fluoroscopy. Once the retriever was completely unsheathed, the microcatheter was carefully stripped from the delivery wire of the device. Control angiogram was then performed from the balloon catheter. 3 minute time interval was observed. The balloon on the balloon guide was then inflated to profile of the vessel. Constant aspiration was then performed through the intermediate catheter, and constant gentle aspiration was performed at the balloon guide. This aspiration was continued as the retriever was gently and slowly withdrawn with fluoroscopic observation into the distal intermediate catheter. The entire system was then gently withdrawn from the intracranial ICA and into the balloon guide. Once the retriever was entirely removed from the system, free aspiration was confirmed at the hub of the balloon guide, with free blood return confirmed. Control angiogram was then performed. Persistent occlusion at the proximal MCA was confirmed. The microcatheter system was then advanced through the intermediate catheter. Once the micro wire microcatheter were beyond the occlusion, the micro wire was removed and stentreiver device was deployed, stripping the microcatheter from the wire. After the device was deployed across the occlusion, the intermediate catheter was placed at the  site of occlusion. The balloon at the balloon guide was inflated to profile of the vessel. Local aspiration was performed at the intermediate catheter upon withdrawal of the device under fluoroscopic observation, with aspiration at the balloon guide. Once the retrieve was withdrawn to the face of the intermediate catheter, both the microcatheter in intermediate catheter were removed from the system. Free aspiration was confirmed at the hub of the balloon guide catheter, with free blood return confirmed. Control angiogram was again performed. Persistent occlusion was identified. We proceeded with a third attempt. The microcatheter system was then advanced through a new intermediate catheter, using Ace 64 catheter. Once the micro wire microcatheter, and intermediate catheter were advanced to the carotid siphon, the microwire and microcatheter were advanced beyond the occlusion. The wire was removed and a new device was deployed, using EMBO trap 5 x 33, stripping the microcatheter from the wire. After the device was deployed across the occlusion, the Ace catheter was placed at the site of occlusion. The balloon at the balloon guide was inflated to profile of the vessel. Local aspiration was performed at the intermediate catheter upon withdrawal of the device under fluoroscopic observation, with aspiration at the balloon guide. Once the retrieve her was entirely removed from the system, free aspiration was confirmed at the hub of the intermediate catheter, with free blood return confirmed. Balloon on the balloon guide was deflated. Control angiogram was performed. Additional views were performed with multiple obliquities. TI CI 3 was confirmed. Balloon guide was removed, and the bright tip sheath was removed. Eight [place] Angio-Seal was deployed. Flat panel CT was performed. This was reviewed and the patient  was extubated. Patient tolerated the procedure well and remained hemodynamically stable throughout. No  complications were encountered. Estimated blood loss approximately 100 cc. IMPRESSION: Status post ultrasound-guided access of right common femoral artery for cervical and cerebral angiogram, with mechanical thrombectomy of right M1 occlusion and restoration of TICI 3 flow after 3 passes. Deployment of Angio-Seal for hemostasis. Signed, Dulcy Fanny. Dellia Nims, RPVI [facility] Rad[facility] PLAN: Right hip straight overnight Target blood pressure 1 16-109 systolic Frequent neurovascular checks. Admission to ICU. Further neuroimaging at the discretion of neurology Electronically Signed   By: Corrie Mckusick D.O.   On: 05/17/2018 18:03   Ir US Guide Vasc Access Right  Result Date: 05/17/2018 INDICATION: 68 year old male with a history of right-sided M1 occlusion, emergent large vessel occlusion EXAM: ULTRASOUND-GUIDED RIGHT COMMON FEMORAL ARTERY ACCESS CERVICAL AND CEREBRAL ANGIOGRAM MECHANICAL THROMBECTOMY RIGHT MCA COMPARISON:  CT imaging same date MEDICATIONS: 2.0 g Ancef. The antibiotic was administered within 1 hour of the procedure ANESTHESIA/SEDATION: General endotracheal tube anesthesia The patient was continuously monitored during the procedure by the interventional radiology nurse under my direct supervision. CONTRAST:  90 cc FLUOROSCOPY TIME:  Fluoroscopy Time: 38 minutes 48 seconds (2,063 mGy). COMPLICATIONS: None TECHNIQUE: Informed written consent was obtained from the patient's family after a thorough discussion of the procedural risks, benefits and alternatives. Specific risks discussed include: Bleeding, infection, contrast reaction, kidney injury/failure, need for further procedure/surgery, arterial injury or dissection, embolization to new territory, intracranial hemorrhage (10-15% risk), neurologic deterioration, cardiopulmonary collapse, death. All questions were addressed. Maximal Sterile Barrier Technique was utilized including during the procedure  including caps, mask, sterile gowns, sterile gloves, sterile drape, hand hygiene and skin antiseptic. A timeout was performed prior to the initiation of the procedure. The anesthesia team was present to provide general endotracheal tube anesthesia and for patient monitoring during the procedure. Interventional neuro radiology nursing staff was also present. FINDINGS: Initial Findings: Right common carotid artery:  Normal course caliber and contour. Right external carotid artery: Patent with antegrade flow. Right internal carotid artery: Normal course caliber and contour of the cervical portion. Vertical and petrous segment patent with normal course caliber contour. Cavernous segment patent. Clinoid segment patent. Antegrade flow of the ophthalmic artery. Ophthalmic segment patent. Terminus patent. Right MCA:  Right M1 occlusion Right ACA: A 1 segment patent. A 2 segment perfuses the right territory. Completion Findings: Right MCA: Complete restoration of flow through the right MCA TICI 3 Flat panel CT: No hemorrhage PROCEDURE: Patient is brought emergently to the neuro angiography suite, with the patient identified appropriately and placed supine position on the table. Left radial arterial line was placed by the anesthesia team. The patient is then prepped and draped in the usual sterile fashion. Ultrasound survey of the right inguinal region was performed with images stored and sent to PACs. 11 blade scalpel was used to make a small incision. Blunt dissection was performed. A micropuncture needle was used access the right common femoral artery under ultrasound. With excellent arterial blood flow returned, an .018 micro wire was passed through the needle, observed to enter the abdominal aorta under fluoroscopy. The needle was removed, and a micropuncture sheath was placed over the wire. The inner dilator and wire were removed, and an 035 Bentson wire was advanced under fluoroscopy into the abdominal aorta. The  sheath was removed and a standard 5 [place] vascular sheath was placed. The dilator was removed and the sheath was flushed. A 68F JB-1 diagnostic catheter was advanced  over the wire to the proximal descending thoracic aorta. Wire was then removed. Double flush of the catheter was performed. Catheter was then used to select the right common carotid artery. Catheter was advanced over the wire into the cervical ICA. Wire was removed. Double flush was performed. Formal angiogram was performed, with roadmap achieved. Exchange length Rosen wire was then passed through the diagnostic catheter to the distal cervical ICA and the diagnostic catheter was removed. The 5 French sheath was removed and exchanged for 8 French 55 centimeter BrightTip sheath. Sheath was flushed and attached to pressurized and heparinized saline bag for constant forward flow. Eight French 95 cm flow gate balloon catheter was then advanced over the wire to the distal cervical segment. Wire was removed. Then a coaxial intermediate catheter and microcatheter combination was prepared on the back table. This combination was CAT-5 catheter and a Trevo Provue18 microcatheter, with a synchro soft wire. This combination was then advanced through the balloon guide into the ICA. System was advanced into the internal carotid artery, to the level of the occlusion. The micro wire was then carefully advanced through the occluded segment. Microcatheter was then pushed through the occluded segment and the wire was removed. Intermediate catheter was advanced over the micro wire to the M1 segment. Blood was then aspirated through the hub of the microcatheter, and a gentle contrast injection was performed confirming intraluminal position. A rotating hemostatic valve was then attached to the back end of the microcatheter, and a pressurized and heparinized saline bag was attached to the catheter. 4 x 40 solitaire device was then selected. Back flush was achieved at the  rotating hemostatic valve, and then the device was gently advanced through the microcatheter to the distal end. The retriever was then unsheathed by withdrawing the microcatheter under fluoroscopy. Once the retriever was completely unsheathed, the microcatheter was carefully stripped from the delivery wire of the device. Control angiogram was then performed from the balloon catheter. 3 minute time interval was observed. The balloon on the balloon guide was then inflated to profile of the vessel. Constant aspiration was then performed through the intermediate catheter, and constant gentle aspiration was performed at the balloon guide. This aspiration was continued as the retriever was gently and slowly withdrawn with fluoroscopic observation into the distal intermediate catheter. The entire system was then gently withdrawn from the intracranial ICA and into the balloon guide. Once the retriever was entirely removed from the system, free aspiration was confirmed at the hub of the balloon guide, with free blood return confirmed. Control angiogram was then performed. Persistent occlusion at the proximal MCA was confirmed. The microcatheter system was then advanced through the intermediate catheter. Once the micro wire microcatheter were beyond the occlusion, the micro wire was removed and stentreiver device was deployed, stripping the microcatheter from the wire. After the device was deployed across the occlusion, the intermediate catheter was placed at the site of occlusion. The balloon at the balloon guide was inflated to profile of the vessel. Local aspiration was performed at the intermediate catheter upon withdrawal of the device under fluoroscopic observation, with aspiration at the balloon guide. Once the retrieve was withdrawn to the face of the intermediate catheter, both the microcatheter in intermediate catheter were removed from the system. Free aspiration was confirmed at the hub of the balloon guide  catheter, with free blood return confirmed. Control angiogram was again performed. Persistent occlusion was identified. We proceeded with a third attempt. The microcatheter system was then advanced through  a new intermediate catheter, using Ace 64 catheter. Once the micro wire microcatheter, and intermediate catheter were advanced to the carotid siphon, the microwire and microcatheter were advanced beyond the occlusion. The wire was removed and a new device was deployed, using EMBO trap 5 x 33, stripping the microcatheter from the wire. After the device was deployed across the occlusion, the Ace catheter was placed at the site of occlusion. The balloon at the balloon guide was inflated to profile of the vessel. Local aspiration was performed at the intermediate catheter upon withdrawal of the device under fluoroscopic observation, with aspiration at the balloon guide. Once the retrieve her was entirely removed from the system, free aspiration was confirmed at the hub of the intermediate catheter, with free blood return confirmed. Balloon on the balloon guide was deflated. Control angiogram was performed. Additional views were performed with multiple obliquities. TI CI 3 was confirmed. Balloon guide was removed, and the bright tip sheath was removed. Eight [place] Angio-Seal was deployed. Flat panel CT was performed. This was reviewed and the patient was extubated. Patient tolerated the procedure well and remained hemodynamically stable throughout. No complications were encountered. Estimated blood loss approximately 100 cc. IMPRESSION: Status post ultrasound-guided access of right common femoral artery for cervical and cerebral angiogram, with mechanical thrombectomy of right M1 occlusion and restoration of TICI 3 flow after 3 passes. Deployment of Angio-Seal for hemostasis. Signed, Dulcy Fanny. Dellia Nims, RPVI [facility] Radiology PLAN: Right hip straight overnight  Target blood pressure 1 32-671 systolic Frequent neurovascular checks. Admission to ICU. Further neuroimaging at the discretion of neurology Electronically Signed   By: Corrie Mckusick D.O.   On: 05/17/2018 18:03   Ir Percutaneous Art Thrombectomy/infusion Intracranial Inc Diag Angio  Result Date: 05/17/2018 INDICATION: 68 year old male with a history of right-sided M1 occlusion, emergent large vessel occlusion EXAM: ULTRASOUND-GUIDED RIGHT COMMON FEMORAL ARTERY ACCESS CERVICAL AND CEREBRAL ANGIOGRAM MECHANICAL THROMBECTOMY RIGHT MCA COMPARISON:  CT imaging same date MEDICATIONS: 2.0 g Ancef. The antibiotic was administered within 1 hour of the procedure ANESTHESIA/SEDATION: General endotracheal tube anesthesia The patient was continuously monitored during the procedure by the interventional radiology nurse under my direct supervision. CONTRAST:  90 cc FLUOROSCOPY TIME:  Fluoroscopy Time: 38 minutes 48 seconds (2,063 mGy). COMPLICATIONS: None TECHNIQUE: Informed written consent was obtained from the patient's family after a thorough discussion of the procedural risks, benefits and alternatives. Specific risks discussed include: Bleeding, infection, contrast reaction, kidney injury/failure, need for further procedure/surgery, arterial injury or dissection, embolization to new territory, intracranial hemorrhage (10-15% risk), neurologic deterioration, cardiopulmonary collapse, death. All questions were addressed. Maximal Sterile Barrier Technique was utilized including during the procedure including caps, mask, sterile gowns, sterile gloves, sterile drape, hand hygiene and skin antiseptic. A timeout was performed prior to the initiation of the procedure. The anesthesia team was present to provide general endotracheal tube anesthesia and for patient monitoring during the procedure. Interventional neuro radiology nursing staff was also present. FINDINGS: Initial Findings: Right common carotid artery:  Normal course  caliber and contour. Right external carotid artery: Patent with antegrade flow. Right internal carotid artery: Normal course caliber and contour of the cervical portion. Vertical and petrous segment patent with normal course caliber contour. Cavernous segment patent. Clinoid segment patent. Antegrade flow of the ophthalmic artery. Ophthalmic segment patent. Terminus patent. Right MCA:  Right M1 occlusion Right ACA: A 1 segment patent. A 2 segment perfuses the right territory. Completion Findings: Right MCA: Complete restoration of flow  through the right MCA TICI 3 Flat panel CT: No hemorrhage PROCEDURE: Patient is brought emergently to the neuro angiography suite, with the patient identified appropriately and placed supine position on the table. Left radial arterial line was placed by the anesthesia team. The patient is then prepped and draped in the usual sterile fashion. Ultrasound survey of the right inguinal region was performed with images stored and sent to PACs. 11 blade scalpel was used to make a small incision. Blunt dissection was performed. A micropuncture needle was used access the right common femoral artery under ultrasound. With excellent arterial blood flow returned, an .018 micro wire was passed through the needle, observed to enter the abdominal aorta under fluoroscopy. The needle was removed, and a micropuncture sheath was placed over the wire. The inner dilator and wire were removed, and an 035 Bentson wire was advanced under fluoroscopy into the abdominal aorta. The sheath was removed and a standard 5 [place] vascular sheath was placed. The dilator was removed and the sheath was flushed. A 80F JB-1 diagnostic catheter was advanced over the wire to the proximal descending thoracic aorta. Wire was then removed. Double flush of the catheter was performed. Catheter was then used to select the right common carotid artery. Catheter was advanced over the wire into the cervical ICA. Wire was removed.  Double flush was performed. Formal angiogram was performed, with roadmap achieved. Exchange length Rosen wire was then passed through the diagnostic catheter to the distal cervical ICA and the diagnostic catheter was removed. The 5 French sheath was removed and exchanged for 8 French 55 centimeter BrightTip sheath. Sheath was flushed and attached to pressurized and heparinized saline bag for constant forward flow. Eight French 95 cm flow gate balloon catheter was then advanced over the wire to the distal cervical segment. Wire was removed. Then a coaxial intermediate catheter and microcatheter combination was prepared on the back table. This combination was CAT-5 catheter and a Trevo Provue18 microcatheter, with a synchro soft wire. This combination was then advanced through the balloon guide into the ICA. System was advanced into the internal carotid artery, to the level of the occlusion. The micro wire was then carefully advanced through the occluded segment. Microcatheter was then pushed through the occluded segment and the wire was removed. Intermediate catheter was advanced over the micro wire to the M1 segment. Blood was then aspirated through the hub of the microcatheter, and a gentle contrast injection was performed confirming intraluminal position. A rotating hemostatic valve was then attached to the back end of the microcatheter, and a pressurized and heparinized saline bag was attached to the catheter. 4 x 40 solitaire device was then selected. Back flush was achieved at the rotating hemostatic valve, and then the device was gently advanced through the microcatheter to the distal end. The retriever was then unsheathed by withdrawing the microcatheter under fluoroscopy. Once the retriever was completely unsheathed, the microcatheter was carefully stripped from the delivery wire of the device. Control angiogram was then performed from the balloon catheter. 3 minute time interval was observed. The balloon on  the balloon guide was then inflated to profile of the vessel. Constant aspiration was then performed through the intermediate catheter, and constant gentle aspiration was performed at the balloon guide. This aspiration was continued as the retriever was gently and slowly withdrawn with fluoroscopic observation into the distal intermediate catheter. The entire system was then gently withdrawn from the intracranial ICA and into the balloon guide. Once the retriever was entirely  removed from the system, free aspiration was confirmed at the hub of the balloon guide, with free blood return confirmed. Control angiogram was then performed. Persistent occlusion at the proximal MCA was confirmed. The microcatheter system was then advanced through the intermediate catheter. Once the micro wire microcatheter were beyond the occlusion, the micro wire was removed and stentreiver device was deployed, stripping the microcatheter from the wire. After the device was deployed across the occlusion, the intermediate catheter was placed at the site of occlusion. The balloon at the balloon guide was inflated to profile of the vessel. Local aspiration was performed at the intermediate catheter upon withdrawal of the device under fluoroscopic observation, with aspiration at the balloon guide. Once the retrieve was withdrawn to the face of the intermediate catheter, both the microcatheter in intermediate catheter were removed from the system. Free aspiration was confirmed at the hub of the balloon guide catheter, with free blood return confirmed. Control angiogram was again performed. Persistent occlusion was identified. We proceeded with a third attempt. The microcatheter system was then advanced through a new intermediate catheter, using Ace 64 catheter. Once the micro wire microcatheter, and intermediate catheter were advanced to the carotid siphon, the microwire and microcatheter were advanced beyond the occlusion. The wire was removed  and a new device was deployed, using EMBO trap 5 x 33, stripping the microcatheter from the wire. After the device was deployed across the occlusion, the Ace catheter was placed at the site of occlusion. The balloon at the balloon guide was inflated to profile of the vessel. Local aspiration was performed at the intermediate catheter upon withdrawal of the device under fluoroscopic observation, with aspiration at the balloon guide. Once the retrieve her was entirely removed from the system, free aspiration was confirmed at the hub of the intermediate catheter, with free blood return confirmed. Balloon on the balloon guide was deflated. Control angiogram was performed. Additional views were performed with multiple obliquities. TI CI 3 was confirmed. Balloon guide was removed, and the bright tip sheath was removed. Eight [place] Angio-Seal was deployed. Flat panel CT was performed. This was reviewed and the patient was extubated. Patient tolerated the procedure well and remained hemodynamically stable throughout. No complications were encountered. Estimated blood loss approximately 100 cc. IMPRESSION: Status post ultrasound-guided access of right common femoral artery for cervical and cerebral angiogram, with mechanical thrombectomy of right M1 occlusion and restoration of TICI 3 flow after 3 passes. Deployment of Angio-Seal for hemostasis. Signed, Dulcy Fanny. Dellia Nims, RPVI [facility] Radiology PLAN: Right hip straight overnight Target blood pressure 1 76-734 systolic Frequent neurovascular checks. Admission to ICU. Further neuroimaging at the discretion of neurology Electronically Signed   By: Corrie Mckusick D.O.   On: 05/17/2018 18:03   Ct Head Code Stroke Wo Contrast  Result Date: 05/17/2018 CLINICAL DATA:  Code stroke.  Left-sided flaccid.  Facial droop EXAM: CT HEAD WITHOUT CONTRAST TECHNIQUE: Contiguous axial images were obtained from the base of the skull  through the vertex without intravenous contrast. COMPARISON:  None. FINDINGS: Brain: Image quality degraded by motion. Ventricle size normal. Negative for hemorrhage or mass. No evidence of acute infarct however sensitivity is decreased due to motion. Vascular: Hyperdense right middle cerebral artery distal right M1 and M2 segments. This is best seen on coronal and sagittal images and is difficult to see on the axial images due to motion. Skull: Negative Sinuses/Orbits: Mild mucosal edema paranasal sinuses. Negative orbit Other: None ASPECTS ([place] Stroke  Program Early CT Score) - Ganglionic level infarction (caudate, lentiform nuclei, internal capsule, insula, M1-M3 cortex): 7 - Supraganglionic infarction (M4-M6 cortex): 3 Total score (0-10 with 10 being normal): 10 IMPRESSION: 1. Hyperdense right MCA suggestive of acute thrombosis. Negative for hemorrhage. No CT evidence of acute infarct however sensitivity is diminished due to considerable motion. 2. ASPECTS is 10 3. These results were called by telephone at the time of interpretation on 05/17/2018 at 1:56 pm to Dr. Leonel Ramsay , who verbally acknowledged these results. Electronically Signed   By: Franchot Gallo M.D.   On: 05/17/2018 13:57    Cardiac Studies   Echocardiogram pending  Patient Profile     68 y.o. male with newly discovered atrial fibrillation, right MCA stroke, small stroke burden noted on MRI no hemorrhage,  hypertension hyperlipidemia  Assessment & Plan    Atrial fibrillation paroxysmal - Eliquis 5 mg twice a day will be started soon given low stroke burden on MRI.  See neurology notes. - Diltiazem currently doing well rate controlled wise.  I will change this to diltiazem CD 120 mg once a day. -CHADSVASc for, age greater than 86, hypertension, stroke -Awaiting echocardiogram. -Preliminary -echocardiogram was being performed at bedside-mild calcification of the aortic valve, trileaflet, no thrombus identified normal EF 65%  trivial TR and MR.  Await final read.  Hyperlipidemia - Used to have a problem with atorvastatin, worried about blood sugars being increased with this medication. - I have started him on Crestor 20 mg once a day for high intensity statin use given his stroke.  Continue to monitor for any side effects.  Co-Q10 is fine.  LDL was 154 hemoglobin A1c 5.7  Acute right MCA stroke - Transient left sided symptoms.  TPA administered.  Recurrence of symptoms.  Taken to interventional neuroradiology lab, thrombectomy occurred.  Excellent resolution of symptoms. -Continue with aggressive secondary risk factor prevention. -Has passed his speech and swallowing study.     For questions or updates, please contact [place] Please consult [email or web address] for contact info under        Signed, Candee Furbish, MD  05/19/2018, 8:55 AM

## 2018-05-19 NOTE — Progress Notes (Signed)
Physical Therapy Treatment Patient Details Name: Patrick Salinas MRN: [id number] DOB: 08-Jun-1950 Today's Date: 05/19/2018    History of Present Illness 68 y.o. male  With PMH HLD, HTN, prostate cancer who  Presented to [facility] ED as a code stroke for c/o left side weakness and gaze deviation.  CT showed right inferior temporal lobe infarct due to right distal M1 occlusion. He was administered TPA and underwent mechanical thrombectomy of right M1 occlusion.     PT Comments    Patient seen for further assessment AC:[id number] negotiation and mobility. No difficulty with performance, however HR elevation to 121 with activity. No further acute PT needs. Will sign off.    Follow Up Recommendations        Equipment Recommendations       Recommendations for Other Services       Precautions / Restrictions Precautions Precautions: None    Mobility  Bed Mobility Overal bed mobility: Modified Independent             General bed mobility comments: no assist required  Transfers Overall transfer level: Independent Equipment used: None Transfers: Sit to/from Stand              Ambulation/Gait Ambulation/Gait assistance: Independent Social research officer, government (Feet): 380 Feet Assistive device: None Gait Pattern/deviations: Step-through pattern;Decreased stride length   Gait velocity interpretation: >2.62 ft/sec, indicative of community ambulatory General Gait Details: steady gait. No LOB noted with head turns, pivoting, or maneuvering around objects. Min guard for safety.   Stairs Stairs: Yes Stairs assistance: Modified independent (Device/Increase time) Stair Management: One rail Right Number of Stairs: 12 General stair comments: No difficulties, Hr elevated to 121    Wheelchair Mobility    Modified Rankin (Stroke Patients Only)       Balance Overall balance assessment: No apparent balance deficits (not formally assessed)                                           Cognition Arousal/Alertness: Awake/alert Behavior During Therapy: WFL for tasks assessed/performed Overall Cognitive Status: Within Functional Limits for tasks assessed                                 General Comments: Scored 0/28 on short blessed test, good dual cog task       Exercises      General Comments General comments (skin integrity, edema, etc.): HR elevated to 121 with stair performance      Pertinent Vitals/Pain      Home Living                      Prior Function            PT Goals (current goals can now be found in the care plan section) Acute Rehab PT Goals Patient Stated Goal: "get back to my list of projects" PT Goal Formulation: With patient/family Time For Goal Achievement: 05/25/18 Potential to Achieve Goals: Good Progress towards PT goals: Goals met and updated - see care plan    Frequency           PT Plan      Co-evaluation              AM-PAC PT "6 Clicks" Mobility   Outcome Measure  End of Session Equipment Utilized During Treatment: Gait belt Activity Tolerance: Patient tolerated treatment well Patient left: in bed;with call bell/phone within reach Nurse Communication: Mobility status       Time: 8377-9396 PT Time Calculation (min) (ACUTE ONLY): 16 min  Charges:  $Gait Training: 8-22 mins                     [name], PT DPT  Board Certified Neurologic Specialist [place] Pager [phone] Office [phone]    [name] 05/19/2018, 8:04 AM

## 2018-05-19 NOTE — Progress Notes (Signed)
STROKE TEAM PROGRESS NOTE   SUBJECTIVE (INTERVAL HISTORY) His wife and son are at the bedside.  Pt is neuro intact this time.  Started on Eliquis this morning.  MRI showed right MCA scattered small infarcts.  MRA showed right MCA patent.   OBJECTIVE Vitals:   05/18/18 2352 05/19/18 0410 05/19/18 0819 05/19/18 1236  BP: (!) 126/91 136/87 136/86 (!) 130/96  Pulse: 71 67 76 72  Resp: 18 18 20 16   Temp: 97.6 F (36.4 C) 98 F (36.7 C) 98.1 F (36.7 C) 97.9 F (36.6 C)  TempSrc: Oral Oral Oral Oral  SpO2: 98% 96% 99% 97%  Weight:      Height:        CBC:  Recent Labs  Lab 05/17/18 1340 05/18/18 0617 05/19/18 0415  WBC 6.4 9.8 8.9  NEUTROABS 3.7  --   --   HGB 16.0 14.6 14.1  HCT 49.2 43.5 42.2  MCV 91.3 91.2 91.5  PLT 246 242 811    Basic Metabolic Panel:  Recent Labs  Lab 05/18/18 0617 05/19/18 0415  NA 138 137  K 4.0 4.7  CL 106 102  CO2 20* 24  GLUCOSE 162* 122*  BUN 12 15  CREATININE 1.00 0.97  CALCIUM 8.5* 8.7*    Lipid Panel:     Component Value Date/Time   CHOL 217 (H) 05/18/2018 0617   TRIG 66 05/18/2018 0617   HDL 50 05/18/2018 0617   CHOLHDL 4.3 05/18/2018 0617   VLDL 13 05/18/2018 0617   LDLCALC 154 (H) 05/18/2018 0617   HgbA1c:  Lab Results  Component Value Date   HGBA1C 5.7 (H) 05/18/2018   Urine Drug Screen: No results found for: LABOPIA, COCAINSCRNUR, LABBENZ, AMPHETMU, THCU, LABBARB  Alcohol Level No results found for: ETH    IMAGING  Ct Angio Head W Or Wo Contrast Ct Angio Neck W Or Wo Contrast 05/17/2018 IMPRESSION:  1. Acute occlusion distal right M1 segment extending into the right M2 segments. This is compatible with embolus given the history of atrial fibrillation. There is flow distal to the embolus due to collateral circulation. No other intracranial occlusion.  2. No significant carotid or vertebral artery stenosis in the neck    Ct Head Wo Contrast 05/17/2018 IMPRESSION:  Subtle area of hypodensity in the right  inferior temporal lobe appears new and likely represents an area of acute infarct. Negative for hemorrhage.   [place] 05/17/2018 IMPRESSION:  Status post ultrasound-guided access of right common femoral artery for cervical and cerebral angiogram, with mechanical thrombectomy of right M1 occlusion and restoration of TICI 3 flow after 3 passes. Deployment of Angio-Seal for hemostasis.  PLAN:  Right hip straight overnight Target blood pressure [id number] systolic Frequent neurovascular checks. Admission to ICU. Further neuroimaging at the discretion of neurology  Electronically Signed   By: Corrie Mckusick D.O.   On: 05/17/2018 18:03    Ct Head Code Stroke Wo Contrast 05/17/2018 IMPRESSION:  1. Hyperdense right MCA suggestive of acute thrombosis. Negative for hemorrhage. No CT evidence of acute infarct however sensitivity is diminished due to considerable motion.  2. ASPECTS is 10    MRI / MRA Head WO Contrast  05/18/2018 IMPRESSION: MRI HEAD: Acute multifocal small RIGHT MCA territory and RIGHT posterior border zone nonhemorrhagic infarcts.  MRA HEAD: 1. Status post successful endovascular revascularization of RIGHT M1 occlusion; no emergent large vessel occlusion. 2. Poor flow related enhancement LEFT M2 segment favored as artifact, less likely new stenosis.  Transthoracic Echocardiogram  05/19/2018 IMPRESSIONS  1. The left ventricle has normal systolic function, with an ejection fraction of 55-60%. The cavity size was normal. There is moderately increased left ventricular wall thickness. Left ventricular diastolic Doppler parameters are indeterminate. No evidence of left ventricular regional wall motion abnormalities.  2. The right ventricle has normal systolic function. The cavity was normal. There is no increase in right ventricular wall thickness.  3. The aortic valve is tricuspid. Mild calcification of the aortic valve.. Mild aortic annular calcification noted.  4. The  mitral valve is normal in structure. There is mild mitral regurgitation.  5. The tricuspid valve is normal in structure.  6. The inferior vena cava was normal in size with <50% respiratory variability.  7. No atrial level shunt detected by colo flow Doppler.   EKG - afib   PHYSICAL EXAM  Temp:  [97.6 F (36.4 C)-99.2 F (37.3 C)] 97.9 F (36.6 C) (02/23 1236) Pulse Rate:  [67-102] 72 (02/23 1236) Resp:  [13-20] 16 (02/23 1236) BP: (125-139)/(86-96) 130/96 (02/23 1236) SpO2:  [96 %-99 %] 97 % (02/23 1236)  General - Well nourished, well developed, in no apparent distress.  Ophthalmologic - fundi not visualized due to noncooperation.  Cardiovascular - irregularly irregular heart rate and rhythm  Mental Status -  Level of arousal and orientation to time, place, and person were intact. Language including expression, naming, repetition, comprehension was assessed and found intact. Fund of Knowledge was assessed and was intact.  Cranial Nerves II - XII - II - Visual field intact OU. III, IV, VI - Extraocular movements intact. V - Facial sensation intact bilaterally. VII - Facial movement intact bilaterally. VIII - Hearing & vestibular intact bilaterally X - Palate elevates symmetrically. XI - Chin turning & shoulder shrug intact bilaterally. XII - Tongue protrusion intact.  Motor Strength - The patient's strength was normal in all extremities and pronator drift was absent.  Bulk was normal and fasciculations were absent.   Motor Tone - Muscle tone was assessed at the neck and appendages and was normal.  Reflexes - The patient's reflexes were symmetrical in all extremities and he had no pathological reflexes.  Sensory - Light touch, temperature/pinprick were assessed and were symmetrical.    Coordination - The patient had normal movements in the hands and feet with no ataxia or dysmetria.  Tremor was absent.  Gait and Station - deferred.    ASSESSMENT/PLAN Patrick Salinas is a 68 y.o. male with history of HLD, HTN, prostate cancer  presenting with left side weakness and right gaze deviation. Improved in ED. tPA Given @ 7371 Friday 05/17/2018 Mechanical thrombectomy of right M1 occlusion and restoration of TICI 3 flow after 3 passes.  Stroke:  Right MCA scattered small infarcts due to right distal M1 occlusion s/p tPA and IR with TICI3 reperfusion - embolic pattern - likely due to new diagnosis of atrial fibrillation  Resultant  Neuro intact now  CT head - hyperdense right MCA suggestive of acute thrombosis.   CTA H&N - Acute occlusion distal right M1 segment extending into the right M2 segments.  IR with TICI3 reperfusion after 3 passes  MRI head - Acute multifocal small RIGHT MCA territory and RIGHT posterior border zone nonhemorrhagic infarcts.  MRA head - right MCA patent  2D Echo  - EF 55 - 60%. No cardiac source of emboli identified.   LDL - 154  HgbA1c - 5.7  VTE prophylaxis - SCDs  Diet  -  Heart healthy with thin liquids.  No antithrombotic prior to admission, now on Eliquis.  Patient counseled to be compliant with his antithrombotic medications  Ongoing aggressive stroke risk factor management  Therapy recommendations:  No further therapy recommended   Disposition:  Pending  Afib RVR, new diagnosis  EKG on presentation showed afib  On cardizem now  Dr. Marlou Porch consulted and recommendation appreciated  Follow-up with cardiology as outpatient  On Eliquis for stroke prevention  Hypertension  Stable  Off cardene or neo  A line discontinued . Long-term BP goal normotensive  Hyperlipidemia  Lipid lowering medication PTA: none  LDL 154, goal < 70  Intolerance to Lipitor in the past  Current lipid lowering medication: Crestor 20  Consider add coQ10 at home  Continue statin at discharge  Other Stroke Risk Factors  Advanced age  ETOH use, advised to drink no more than 1 alcoholic beverage per day.  Other  Active Problems  Prostate cancer  Elevated TG  Hospital day # 2  Rosalin Hawking, MD PhD Stroke Neurology 05/19/2018 4:56 PM  To contact Stroke Continuity provider, please refer to [email or web address] After hours, contact General Neurology

## 2018-05-20 ENCOUNTER — Encounter (HOSPITAL_COMMUNITY): Payer: Self-pay | Admitting: Interventional Radiology

## 2018-05-20 DIAGNOSIS — I10 Essential (primary) hypertension: Secondary | ICD-10-CM | POA: Diagnosis present

## 2018-05-20 DIAGNOSIS — I4891 Unspecified atrial fibrillation: Secondary | ICD-10-CM | POA: Diagnosis present

## 2018-05-20 DIAGNOSIS — E785 Hyperlipidemia, unspecified: Secondary | ICD-10-CM | POA: Diagnosis present

## 2018-05-20 LAB — CBC
HCT: 44.2 % (ref 39.0–52.0)
Hemoglobin: 14.6 g/dL (ref 13.0–17.0)
MCH: 30 pg (ref 26.0–34.0)
MCHC: 33 g/dL (ref 30.0–36.0)
MCV: 90.8 fL (ref 80.0–100.0)
Platelets: 212 10*3/uL (ref 150–400)
RBC: 4.87 MIL/uL (ref 4.22–5.81)
RDW: 13.2 % (ref 11.5–15.5)
WBC: 8.4 10*3/uL (ref 4.0–10.5)
nRBC: 0 % (ref 0.0–0.2)

## 2018-05-20 LAB — BASIC METABOLIC PANEL
ANION GAP: 9 (ref 5–15)
BUN: 16 mg/dL (ref 8–23)
CO2: 26 mmol/L (ref 22–32)
CREATININE: 0.97 mg/dL (ref 0.61–1.24)
Calcium: 8.9 mg/dL (ref 8.9–10.3)
Chloride: 102 mmol/L (ref 98–111)
GFR calc Af Amer: >60 mL/min (ref >60)
GFR calc non Af Amer: >60 mL/min (ref >60)
Glucose, Bld: 106 mg/dL — ABNORMAL HIGH (ref 70–99)
Potassium: 4.6 mmol/L (ref 3.5–5.1)
Sodium: 137 mmol/L (ref 135–145)

## 2018-05-20 MED ORDER — ROSUVASTATIN CALCIUM 20 MG PO TABS: 20.0000 mg | ORAL_TABLET | Freq: Every day | ORAL | 2 refills | Status: DC

## 2018-05-20 MED ORDER — DILTIAZEM HCL ER COATED BEADS 120 MG PO CP24: 120.0000 mg | ORAL_CAPSULE | Freq: Every day | ORAL | 2 refills | Status: DC

## 2018-05-20 MED ORDER — APIXABAN 5 MG PO TABS: 5.0000 mg | ORAL_TABLET | Freq: Two times a day (BID) | ORAL | 2 refills | Status: DC

## 2018-05-20 NOTE — Progress Notes (Signed)
Referring Physician(s): [place], [place]  Supervising Physician: Corrie Mckusick  Patient Status:  [facility] - In-pt  Chief Complaint: None  Subjective:  Right MCA M1 segment occlusion s/p emergent mechanical thrombectomy achieving a TICI 3 revascularization 05/17/2018 by Dr. Earleen Newport. Patient awake and alert laying in bed with no complaints at this time. Accompanied by wife at bedside. States that he is going home today. Can spontaneously move all extremities. Right groin incision c/d/i.   Allergies: Hctz [hydrochlorothiazide] and Lipitor [atorvastatin calcium]  Medications: Prior to Admission medications   Medication Sig Start Date End Date Taking? Authorizing Provider  ibuprofen (ADVIL,MOTRIN) 200 MG tablet Take 200 mg by mouth every 6 (six) hours as needed.   Yes [provider]  lisinopril (PRINIVIL,ZESTRIL) 20 MG tablet Take 20 mg by mouth daily.   Yes [provider]  HYDROcodone-acetaminophen (NORCO) 5-325 MG per tablet Take 1-2 tablets by mouth every 6 (six) hours as needed. Patient not taking: Reported on 05/18/2018 11/02/14   Debbrah Alar, PA-C     Vital Signs: BP (!) 141/101 (BP Location: Left Arm)   Pulse 80   Temp 98.4 F (36.9 C) (Oral)   Resp 18   Ht 6\' 3"  (1.905 m)   Wt 212 lb 1.3 oz (96.2 kg)   SpO2 96%   BMI 26.51 kg/m   Physical Exam Vitals signs and nursing note reviewed.  Constitutional:      General: He is not in acute distress.    Appearance: Normal appearance.  Pulmonary:     Effort: Pulmonary effort is normal. No respiratory distress.  Skin:    Comments: Right groin incision soft without active bleeding or hematoma.  Neurological:     Mental Status: He is alert.     Comments: Alert, awake, and oriented x3. Speech and comprehension intact. PERRL bilaterally. EOMs intact bilaterally without nystagmus or subjective diplopia. No facial asymmetry. Tongue midline. Can spontaneously move all extremities. No  pronator drift. Fine motor and coordination intact and symmetric. Visual fields, gait, romberg, heel to toe not assessed. Distal pulses 2+ bilaterally.  Psychiatric:        Mood and Affect: Mood normal.        Behavior: Behavior normal.        Thought Content: Thought content normal.        Judgment: Judgment normal.     Imaging: Ct Angio Head W Or Wo Contrast  Result Date: 05/17/2018 CLINICAL DATA:  Stroke.  Left-sided weakness.  Atrial fibrillation. EXAM: CT ANGIOGRAPHY HEAD AND NECK TECHNIQUE: Multidetector CT imaging of the head and neck was performed using the standard protocol during bolus administration of intravenous contrast. Multiplanar CT image reconstructions and MIPs were obtained to evaluate the vascular anatomy. Carotid stenosis measurements (when applicable) are obtained utilizing NASCET criteria, using the distal internal carotid diameter as the denominator. CONTRAST:  70mL ISOVUE-370 IOPAMIDOL (ISOVUE-370) INJECTION 76% COMPARISON:  CT head 05/17/2018 FINDINGS: CTA NECK FINDINGS Aortic arch: Standard branching. Imaged portion shows no evidence of aneurysm or dissection. No significant stenosis of the major arch vessel origins. Right carotid system: Widely patent without stenosis. Left carotid system: Widely patent without stenosis. Vertebral arteries: Both vertebral arteries widely patent without stenosis. Skeleton: ACDF with solid fusion C4-5. No acute skeletal abnormality. Other neck: Negative Upper chest: Negative Review of the MIP images confirms the above findings CTA HEAD FINDINGS Anterior circulation: Right cavernous carotid widely patent. Right anterior cerebral artery widely patent. Abrupt occlusion distal right M1 segment with  clot in the distal right M1 segment and proximal M2 segments compatible with embolus. This corresponds to hyperdense right MCA on CT. Embolus extends into the inferior division right MCA with flow distal to the occlusion due to collaterals. There is  also embolus in the superior division of the right MCA. Cavernous carotid widely patent. Anterior and middle cerebral arteries widely patent. Posterior circulation: Both vertebral arteries patent to the basilar. PICA patent bilaterally. Basilar widely patent. Superior cerebellar and posterior cerebral arteries patent bilaterally without stenosis. Patent posterior communicating artery bilaterally. Venous sinuses: Not well visualized due to arterial phase Anatomic variants: None Delayed phase: Not performed Review of the MIP images confirms the above findings IMPRESSION: 1. Acute occlusion distal right M1 segment extending into the right M2 segments. This is compatible with embolus given the history of atrial fibrillation. There is flow distal to the embolus due to collateral circulation. No other intracranial occlusion. 2. No significant carotid or vertebral artery stenosis in the neck 3. These results were called by telephone at the time of interpretation on 05/17/2018 at 2:12 pm to Dr. Leonel Ramsay , who verbally acknowledged these results. Electronically Signed   By: Franchot Gallo M.D.   On: 05/17/2018 14:13   Ct Head Wo Contrast  Result Date: 05/17/2018 CLINICAL DATA:  Stroke. Post tPA. Worsening neurologic examination. EXAM: CT HEAD WITHOUT CONTRAST TECHNIQUE: Contiguous axial images were obtained from the base of the skull through the vertex without intravenous contrast. COMPARISON:  CT head 05/17/2018 FINDINGS: Brain: Ill-defined hypodensity in the right inferior temporal lobe. Given the prior right MCA occlusion, this likely represents acute infarct. This is not visualized on the CT from earlier today however the prior study was degraded by motion. Never less, this does appear to be a new finding. No associated hemorrhage. Ventricle size remains normal. Vascular: Vascular contrast due to CTA Skull: Negative Sinuses/Orbits: Mild mucosal edema paranasal sinuses. Other: None IMPRESSION: Subtle area of  hypodensity in the right inferior temporal lobe appears new and likely represents an area of acute infarct. Negative for hemorrhage. Images were reviewed with Dr. Leonel Ramsay at 1540 hours. Electronically Signed   By: Franchot Gallo M.D.   On: 05/17/2018 16:10   Ct Angio Neck W Or Wo Contrast  Result Date: 05/17/2018 CLINICAL DATA:  Stroke.  Left-sided weakness.  Atrial fibrillation. EXAM: CT ANGIOGRAPHY HEAD AND NECK TECHNIQUE: Multidetector CT imaging of the head and neck was performed using the standard protocol during bolus administration of intravenous contrast. Multiplanar CT image reconstructions and MIPs were obtained to evaluate the vascular anatomy. Carotid stenosis measurements (when applicable) are obtained utilizing NASCET criteria, using the distal internal carotid diameter as the denominator. CONTRAST:  25mL ISOVUE-370 IOPAMIDOL (ISOVUE-370) INJECTION 76% COMPARISON:  CT head 05/17/2018 FINDINGS: CTA NECK FINDINGS Aortic arch: Standard branching. Imaged portion shows no evidence of aneurysm or dissection. No significant stenosis of the major arch vessel origins. Right carotid system: Widely patent without stenosis. Left carotid system: Widely patent without stenosis. Vertebral arteries: Both vertebral arteries widely patent without stenosis. Skeleton: ACDF with solid fusion C4-5. No acute skeletal abnormality. Other neck: Negative Upper chest: Negative Review of the MIP images confirms the above findings CTA HEAD FINDINGS Anterior circulation: Right cavernous carotid widely patent. Right anterior cerebral artery widely patent. Abrupt occlusion distal right M1 segment with clot in the distal right M1 segment and proximal M2 segments compatible with embolus. This corresponds to hyperdense right MCA on CT. Embolus extends into the inferior division right MCA with flow  distal to the occlusion due to collaterals. There is also embolus in the superior division of the right MCA. Cavernous carotid widely  patent. Anterior and middle cerebral arteries widely patent. Posterior circulation: Both vertebral arteries patent to the basilar. PICA patent bilaterally. Basilar widely patent. Superior cerebellar and posterior cerebral arteries patent bilaterally without stenosis. Patent posterior communicating artery bilaterally. Venous sinuses: Not well visualized due to arterial phase Anatomic variants: None Delayed phase: Not performed Review of the MIP images confirms the above findings IMPRESSION: 1. Acute occlusion distal right M1 segment extending into the right M2 segments. This is compatible with embolus given the history of atrial fibrillation. There is flow distal to the embolus due to collateral circulation. No other intracranial occlusion. 2. No significant carotid or vertebral artery stenosis in the neck 3. These results were called by telephone at the time of interpretation on 05/17/2018 at 2:12 pm to Dr. Leonel Ramsay , who verbally acknowledged these results. Electronically Signed   By: Franchot Gallo M.D.   On: 05/17/2018 14:13   Mr Jodene Nam Head Wo Contrast  Result Date: 05/18/2018 CLINICAL DATA:  Follow-up stroke after endovascular revascularization of RIGHT M1 occlusion. History of atrial fibrillation, hypercholesterolemia and prostate cancer. EXAM: MRI HEAD WITHOUT CONTRAST MRA HEAD WITHOUT CONTRAST TECHNIQUE: Multiplanar, multiecho pulse sequences of the brain and surrounding structures were obtained without intravenous contrast. Angiographic images of the head were obtained using MRA technique without contrast. COMPARISON:  CT and CTA HEAD May 17, 2018 FINDINGS: MRI HEAD FINDINGS INTRACRANIAL CONTENTS: Patchy areas of RIGHT frontal, temporal and parietal lobe discontinuous reduced diffusion including RIGHT operculum and insula with low ADC values and faint increased FLAIR T2 hyperintense signal. No susceptibility artifact to suggest hemorrhage. No midline shift, mass effect or masses. No parenchymal  brain volume loss for age. No hydrocephalus. No abnormal extra-axial fluid collections. VASCULAR: Major intracranial vascular flow voids present at skull base. Dolichoectasia seen with chronic hypertension. SKULL AND UPPER CERVICAL SPINE: No abnormal sellar expansion. No suspicious calvarial bone marrow signal. Severe C3-4 spondylosis. Craniocervical junction maintained. SINUSES/ORBITS: Trace mastoid effusions with trace paranasal sinus mucosal thickening.The included ocular globes and orbital contents are non-suspicious. OTHER: None. MRA HEAD FINDINGS ANTERIOR CIRCULATION: Normal flow related enhancement of the included cervical, petrous, cavernous and supraclinoid internal carotid arteries. Patent anterior communicating artery. Patent anterior and middle cerebral arteries. Short segment of partial decreased flow related enhancement LEFT proximal M2 associated with pulsation artifact. No large vessel occlusion, flow limiting stenosis, aneurysm. POSTERIOR CIRCULATION: Codominant vertebral arteries. Vertebrobasilar arteries are patent, with normal flow related enhancement of the main branch vessels. Patent posterior cerebral arteries. Robust bilateral posterior communicating arteries, fetal origin on the RIGHT. No large vessel occlusion, flow limiting stenosis,  aneurysm. ANATOMIC VARIANTS: None. Source images and MIP images were reviewed. IMPRESSION: MRI HEAD: 1. Acute multifocal small RIGHT MCA territory and RIGHT posterior border zone nonhemorrhagic infarcts. MRA HEAD: 1. Status post successful endovascular revascularization of RIGHT M1 occlusion; no emergent large vessel occlusion. 2. Poor flow related enhancement LEFT M2 segment favored as artifact, less likely new stenosis. Electronically Signed   By: Elon Alas M.D.   On: 05/18/2018 13:08   Mr Brain Wo Contrast  Result Date: 05/18/2018 CLINICAL DATA:  Follow-up stroke after endovascular revascularization of RIGHT M1 occlusion. History of atrial  fibrillation, hypercholesterolemia and prostate cancer. EXAM: MRI HEAD WITHOUT CONTRAST MRA HEAD WITHOUT CONTRAST TECHNIQUE: Multiplanar, multiecho pulse sequences of the brain and surrounding structures were obtained without intravenous contrast. Angiographic images of the  head were obtained using MRA technique without contrast. COMPARISON:  CT and CTA HEAD May 17, 2018 FINDINGS: MRI HEAD FINDINGS INTRACRANIAL CONTENTS: Patchy areas of RIGHT frontal, temporal and parietal lobe discontinuous reduced diffusion including RIGHT operculum and insula with low ADC values and faint increased FLAIR T2 hyperintense signal. No susceptibility artifact to suggest hemorrhage. No midline shift, mass effect or masses. No parenchymal brain volume loss for age. No hydrocephalus. No abnormal extra-axial fluid collections. VASCULAR: Major intracranial vascular flow voids present at skull base. Dolichoectasia seen with chronic hypertension. SKULL AND UPPER CERVICAL SPINE: No abnormal sellar expansion. No suspicious calvarial bone marrow signal. Severe C3-4 spondylosis. Craniocervical junction maintained. SINUSES/ORBITS: Trace mastoid effusions with trace paranasal sinus mucosal thickening.The included ocular globes and orbital contents are non-suspicious. OTHER: None. MRA HEAD FINDINGS ANTERIOR CIRCULATION: Normal flow related enhancement of the included cervical, petrous, cavernous and supraclinoid internal carotid arteries. Patent anterior communicating artery. Patent anterior and middle cerebral arteries. Short segment of partial decreased flow related enhancement LEFT proximal M2 associated with pulsation artifact. No large vessel occlusion, flow limiting stenosis, aneurysm. POSTERIOR CIRCULATION: Codominant vertebral arteries. Vertebrobasilar arteries are patent, with normal flow related enhancement of the main branch vessels. Patent posterior cerebral arteries. Robust bilateral posterior communicating arteries, fetal origin  on the RIGHT. No large vessel occlusion, flow limiting stenosis,  aneurysm. ANATOMIC VARIANTS: None. Source images and MIP images were reviewed. IMPRESSION: MRI HEAD: 1. Acute multifocal small RIGHT MCA territory and RIGHT posterior border zone nonhemorrhagic infarcts. MRA HEAD: 1. Status post successful endovascular revascularization of RIGHT M1 occlusion; no emergent large vessel occlusion. 2. Poor flow related enhancement LEFT M2 segment favored as artifact, less likely new stenosis. Electronically Signed   By: Elon Alas M.D.   On: 05/18/2018 13:08   [place]  Result Date: 05/17/2018 INDICATION: 68 year old male with a history of right-sided M1 occlusion, emergent large vessel occlusion EXAM: ULTRASOUND-GUIDED RIGHT COMMON FEMORAL ARTERY ACCESS CERVICAL AND CEREBRAL ANGIOGRAM MECHANICAL THROMBECTOMY RIGHT MCA COMPARISON:  CT imaging same date MEDICATIONS: 2.0 g Ancef. The antibiotic was administered within 1 hour of the procedure ANESTHESIA/SEDATION: General endotracheal tube anesthesia The patient was continuously monitored during the procedure by the interventional radiology nurse under my direct supervision. CONTRAST:  90 cc FLUOROSCOPY TIME:  Fluoroscopy Time: 38 minutes 48 seconds (2,063 mGy). COMPLICATIONS: None TECHNIQUE: Informed written consent was obtained from the patient's family after a thorough discussion of the procedural risks, benefits and alternatives. Specific risks discussed include: Bleeding, infection, contrast reaction, kidney injury/failure, need for further procedure/surgery, arterial injury or dissection, embolization to new territory, intracranial hemorrhage (10-15% risk), neurologic deterioration, cardiopulmonary collapse, death. All questions were addressed. Maximal Sterile Barrier Technique was utilized including during the procedure including caps, mask, sterile gowns, sterile gloves, sterile drape, hand hygiene and skin antiseptic. A timeout was performed prior to  the initiation of the procedure. The anesthesia team was present to provide general endotracheal tube anesthesia and for patient monitoring during the procedure. Interventional neuro radiology nursing staff was also present. FINDINGS: Initial Findings: Right common carotid artery:  Normal course caliber and contour. Right external carotid artery: Patent with antegrade flow. Right internal carotid artery: Normal course caliber and contour of the cervical portion. Vertical and petrous segment patent with normal course caliber contour. Cavernous segment patent. Clinoid segment patent. Antegrade flow of the ophthalmic artery. Ophthalmic segment patent. Terminus patent. Right MCA:  Right M1 occlusion Right ACA: A 1 segment patent. A 2 segment perfuses the right territory. Completion  Findings: Right MCA: Complete restoration of flow through the right MCA TICI 3 Flat panel CT: No hemorrhage PROCEDURE: Patient is brought emergently to the neuro angiography suite, with the patient identified appropriately and placed supine position on the table. Left radial arterial line was placed by the anesthesia team. The patient is then prepped and draped in the usual sterile fashion. Ultrasound survey of the right inguinal region was performed with images stored and sent to PACs. 11 blade scalpel was used to make a small incision. Blunt dissection was performed. A micropuncture needle was used access the right common femoral artery under ultrasound. With excellent arterial blood flow returned, an .018 micro wire was passed through the needle, observed to enter the abdominal aorta under fluoroscopy. The needle was removed, and a micropuncture sheath was placed over the wire. The inner dilator and wire were removed, and an 035 Bentson wire was advanced under fluoroscopy into the abdominal aorta. The sheath was removed and a standard 5 [place] vascular sheath was placed. The dilator was removed and the sheath was flushed. A 71F JB-1  diagnostic catheter was advanced over the wire to the proximal descending thoracic aorta. Wire was then removed. Double flush of the catheter was performed. Catheter was then used to select the right common carotid artery. Catheter was advanced over the wire into the cervical ICA. Wire was removed. Double flush was performed. Formal angiogram was performed, with roadmap achieved. Exchange length Rosen wire was then passed through the diagnostic catheter to the distal cervical ICA and the diagnostic catheter was removed. The 5 French sheath was removed and exchanged for 8 French 55 centimeter BrightTip sheath. Sheath was flushed and attached to pressurized and heparinized saline bag for constant forward flow. Eight French 95 cm flow gate balloon catheter was then advanced over the wire to the distal cervical segment. Wire was removed. Then a coaxial intermediate catheter and microcatheter combination was prepared on the back table. This combination was CAT-5 catheter and a Trevo Provue18 microcatheter, with a synchro soft wire. This combination was then advanced through the balloon guide into the ICA. System was advanced into the internal carotid artery, to the level of the occlusion. The micro wire was then carefully advanced through the occluded segment. Microcatheter was then pushed through the occluded segment and the wire was removed. Intermediate catheter was advanced over the micro wire to the M1 segment. Blood was then aspirated through the hub of the microcatheter, and a gentle contrast injection was performed confirming intraluminal position. A rotating hemostatic valve was then attached to the back end of the microcatheter, and a pressurized and heparinized saline bag was attached to the catheter. 4 x 40 solitaire device was then selected. Back flush was achieved at the rotating hemostatic valve, and then the device was gently advanced through the microcatheter to the distal end. The retriever was then  unsheathed by withdrawing the microcatheter under fluoroscopy. Once the retriever was completely unsheathed, the microcatheter was carefully stripped from the delivery wire of the device. Control angiogram was then performed from the balloon catheter. 3 minute time interval was observed. The balloon on the balloon guide was then inflated to profile of the vessel. Constant aspiration was then performed through the intermediate catheter, and constant gentle aspiration was performed at the balloon guide. This aspiration was continued as the retriever was gently and slowly withdrawn with fluoroscopic observation into the distal intermediate catheter. The entire system was then gently withdrawn from the intracranial ICA and into the  balloon guide. Once the retriever was entirely removed from the system, free aspiration was confirmed at the hub of the balloon guide, with free blood return confirmed. Control angiogram was then performed. Persistent occlusion at the proximal MCA was confirmed. The microcatheter system was then advanced through the intermediate catheter. Once the micro wire microcatheter were beyond the occlusion, the micro wire was removed and stentreiver device was deployed, stripping the microcatheter from the wire. After the device was deployed across the occlusion, the intermediate catheter was placed at the site of occlusion. The balloon at the balloon guide was inflated to profile of the vessel. Local aspiration was performed at the intermediate catheter upon withdrawal of the device under fluoroscopic observation, with aspiration at the balloon guide. Once the retrieve was withdrawn to the face of the intermediate catheter, both the microcatheter in intermediate catheter were removed from the system. Free aspiration was confirmed at the hub of the balloon guide catheter, with free blood return confirmed. Control angiogram was again performed. Persistent occlusion was identified. We proceeded with a  third attempt. The microcatheter system was then advanced through a new intermediate catheter, using Ace 64 catheter. Once the micro wire microcatheter, and intermediate catheter were advanced to the carotid siphon, the microwire and microcatheter were advanced beyond the occlusion. The wire was removed and a new device was deployed, using EMBO trap 5 x 33, stripping the microcatheter from the wire. After the device was deployed across the occlusion, the Ace catheter was placed at the site of occlusion. The balloon at the balloon guide was inflated to profile of the vessel. Local aspiration was performed at the intermediate catheter upon withdrawal of the device under fluoroscopic observation, with aspiration at the balloon guide. Once the retrieve her was entirely removed from the system, free aspiration was confirmed at the hub of the intermediate catheter, with free blood return confirmed. Balloon on the balloon guide was deflated. Control angiogram was performed. Additional views were performed with multiple obliquities. TI CI 3 was confirmed. Balloon guide was removed, and the bright tip sheath was removed. Eight [place] Angio-Seal was deployed. Flat panel CT was performed. This was reviewed and the patient was extubated. Patient tolerated the procedure well and remained hemodynamically stable throughout. No complications were encountered. Estimated blood loss approximately 100 cc. IMPRESSION: Status post ultrasound-guided access of right common femoral artery for cervical and cerebral angiogram, with mechanical thrombectomy of right M1 occlusion and restoration of TICI 3 flow after 3 passes. Deployment of Angio-Seal for hemostasis. Signed, Dulcy Fanny. Dellia Nims, RPVI [facility] Radiology PLAN: Right hip straight overnight Target blood pressure 1 78-295 systolic Frequent neurovascular checks. Admission to ICU. Further neuroimaging at the discretion of neurology  Electronically Signed   By: Corrie Mckusick D.O.   On: 05/17/2018 18:03   Ir US Guide Vasc Access Right  Result Date: 05/17/2018 INDICATION: 68 year old male with a history of right-sided M1 occlusion, emergent large vessel occlusion EXAM: ULTRASOUND-GUIDED RIGHT COMMON FEMORAL ARTERY ACCESS CERVICAL AND CEREBRAL ANGIOGRAM MECHANICAL THROMBECTOMY RIGHT MCA COMPARISON:  CT imaging same date MEDICATIONS: 2.0 g Ancef. The antibiotic was administered within 1 hour of the procedure ANESTHESIA/SEDATION: General endotracheal tube anesthesia The patient was continuously monitored during the procedure by the interventional radiology nurse under my direct supervision. CONTRAST:  90 cc FLUOROSCOPY TIME:  Fluoroscopy Time: 38 minutes 48 seconds (2,063 mGy). COMPLICATIONS: None TECHNIQUE: Informed written consent was obtained from the patient's family after a thorough discussion of the procedural  risks, benefits and alternatives. Specific risks discussed include: Bleeding, infection, contrast reaction, kidney injury/failure, need for further procedure/surgery, arterial injury or dissection, embolization to new territory, intracranial hemorrhage (10-15% risk), neurologic deterioration, cardiopulmonary collapse, death. All questions were addressed. Maximal Sterile Barrier Technique was utilized including during the procedure including caps, mask, sterile gowns, sterile gloves, sterile drape, hand hygiene and skin antiseptic. A timeout was performed prior to the initiation of the procedure. The anesthesia team was present to provide general endotracheal tube anesthesia and for patient monitoring during the procedure. Interventional neuro radiology nursing staff was also present. FINDINGS: Initial Findings: Right common carotid artery:  Normal course caliber and contour. Right external carotid artery: Patent with antegrade flow. Right internal carotid artery: Normal course caliber and contour of the cervical portion. Vertical and  petrous segment patent with normal course caliber contour. Cavernous segment patent. Clinoid segment patent. Antegrade flow of the ophthalmic artery. Ophthalmic segment patent. Terminus patent. Right MCA:  Right M1 occlusion Right ACA: A 1 segment patent. A 2 segment perfuses the right territory. Completion Findings: Right MCA: Complete restoration of flow through the right MCA TICI 3 Flat panel CT: No hemorrhage PROCEDURE: Patient is brought emergently to the neuro angiography suite, with the patient identified appropriately and placed supine position on the table. Left radial arterial line was placed by the anesthesia team. The patient is then prepped and draped in the usual sterile fashion. Ultrasound survey of the right inguinal region was performed with images stored and sent to PACs. 11 blade scalpel was used to make a small incision. Blunt dissection was performed. A micropuncture needle was used access the right common femoral artery under ultrasound. With excellent arterial blood flow returned, an .018 micro wire was passed through the needle, observed to enter the abdominal aorta under fluoroscopy. The needle was removed, and a micropuncture sheath was placed over the wire. The inner dilator and wire were removed, and an 035 Bentson wire was advanced under fluoroscopy into the abdominal aorta. The sheath was removed and a standard 5 [place] vascular sheath was placed. The dilator was removed and the sheath was flushed. A 87F JB-1 diagnostic catheter was advanced over the wire to the proximal descending thoracic aorta. Wire was then removed. Double flush of the catheter was performed. Catheter was then used to select the right common carotid artery. Catheter was advanced over the wire into the cervical ICA. Wire was removed. Double flush was performed. Formal angiogram was performed, with roadmap achieved. Exchange length Rosen wire was then passed through the diagnostic catheter to the distal cervical ICA  and the diagnostic catheter was removed. The 5 French sheath was removed and exchanged for 8 French 55 centimeter BrightTip sheath. Sheath was flushed and attached to pressurized and heparinized saline bag for constant forward flow. Eight French 95 cm flow gate balloon catheter was then advanced over the wire to the distal cervical segment. Wire was removed. Then a coaxial intermediate catheter and microcatheter combination was prepared on the back table. This combination was CAT-5 catheter and a Trevo Provue18 microcatheter, with a synchro soft wire. This combination was then advanced through the balloon guide into the ICA. System was advanced into the internal carotid artery, to the level of the occlusion. The micro wire was then carefully advanced through the occluded segment. Microcatheter was then pushed through the occluded segment and the wire was removed. Intermediate catheter was advanced over the micro wire to the M1 segment. Blood was then aspirated through the hub  of the microcatheter, and a gentle contrast injection was performed confirming intraluminal position. A rotating hemostatic valve was then attached to the back end of the microcatheter, and a pressurized and heparinized saline bag was attached to the catheter. 4 x 40 solitaire device was then selected. Back flush was achieved at the rotating hemostatic valve, and then the device was gently advanced through the microcatheter to the distal end. The retriever was then unsheathed by withdrawing the microcatheter under fluoroscopy. Once the retriever was completely unsheathed, the microcatheter was carefully stripped from the delivery wire of the device. Control angiogram was then performed from the balloon catheter. 3 minute time interval was observed. The balloon on the balloon guide was then inflated to profile of the vessel. Constant aspiration was then performed through the intermediate catheter, and constant gentle aspiration was performed at  the balloon guide. This aspiration was continued as the retriever was gently and slowly withdrawn with fluoroscopic observation into the distal intermediate catheter. The entire system was then gently withdrawn from the intracranial ICA and into the balloon guide. Once the retriever was entirely removed from the system, free aspiration was confirmed at the hub of the balloon guide, with free blood return confirmed. Control angiogram was then performed. Persistent occlusion at the proximal MCA was confirmed. The microcatheter system was then advanced through the intermediate catheter. Once the micro wire microcatheter were beyond the occlusion, the micro wire was removed and stentreiver device was deployed, stripping the microcatheter from the wire. After the device was deployed across the occlusion, the intermediate catheter was placed at the site of occlusion. The balloon at the balloon guide was inflated to profile of the vessel. Local aspiration was performed at the intermediate catheter upon withdrawal of the device under fluoroscopic observation, with aspiration at the balloon guide. Once the retrieve was withdrawn to the face of the intermediate catheter, both the microcatheter in intermediate catheter were removed from the system. Free aspiration was confirmed at the hub of the balloon guide catheter, with free blood return confirmed. Control angiogram was again performed. Persistent occlusion was identified. We proceeded with a third attempt. The microcatheter system was then advanced through a new intermediate catheter, using Ace 64 catheter. Once the micro wire microcatheter, and intermediate catheter were advanced to the carotid siphon, the microwire and microcatheter were advanced beyond the occlusion. The wire was removed and a new device was deployed, using EMBO trap 5 x 33, stripping the microcatheter from the wire. After the device was deployed across the occlusion, the Ace catheter was placed at the  site of occlusion. The balloon at the balloon guide was inflated to profile of the vessel. Local aspiration was performed at the intermediate catheter upon withdrawal of the device under fluoroscopic observation, with aspiration at the balloon guide. Once the retrieve her was entirely removed from the system, free aspiration was confirmed at the hub of the intermediate catheter, with free blood return confirmed. Balloon on the balloon guide was deflated. Control angiogram was performed. Additional views were performed with multiple obliquities. TI CI 3 was confirmed. Balloon guide was removed, and the bright tip sheath was removed. Eight [place] Angio-Seal was deployed. Flat panel CT was performed. This was reviewed and the patient was extubated. Patient tolerated the procedure well and remained hemodynamically stable throughout. No complications were encountered. Estimated blood loss approximately 100 cc. IMPRESSION: Status post ultrasound-guided access of right common femoral artery for cervical and cerebral angiogram, with mechanical thrombectomy of right M1 occlusion and  restoration of TICI 3 flow after 3 passes. Deployment of Angio-Seal for hemostasis. Signed, Dulcy Fanny. Dellia Nims, RPVI [facility] Radiology PLAN: Right hip straight overnight Target blood pressure 1 27-062 systolic Frequent neurovascular checks. Admission to ICU. Further neuroimaging at the discretion of neurology Electronically Signed   By: Corrie Mckusick D.O.   On: 05/17/2018 18:03   Ir Percutaneous Art Thrombectomy/infusion Intracranial Inc Diag Angio  Result Date: 05/17/2018 INDICATION: 68 year old male with a history of right-sided M1 occlusion, emergent large vessel occlusion EXAM: ULTRASOUND-GUIDED RIGHT COMMON FEMORAL ARTERY ACCESS CERVICAL AND CEREBRAL ANGIOGRAM MECHANICAL THROMBECTOMY RIGHT MCA COMPARISON:  CT imaging same date MEDICATIONS: 2.0 g Ancef. The antibiotic was administered  within 1 hour of the procedure ANESTHESIA/SEDATION: General endotracheal tube anesthesia The patient was continuously monitored during the procedure by the interventional radiology nurse under my direct supervision. CONTRAST:  90 cc FLUOROSCOPY TIME:  Fluoroscopy Time: 38 minutes 48 seconds (2,063 mGy). COMPLICATIONS: None TECHNIQUE: Informed written consent was obtained from the patient's family after a thorough discussion of the procedural risks, benefits and alternatives. Specific risks discussed include: Bleeding, infection, contrast reaction, kidney injury/failure, need for further procedure/surgery, arterial injury or dissection, embolization to new territory, intracranial hemorrhage (10-15% risk), neurologic deterioration, cardiopulmonary collapse, death. All questions were addressed. Maximal Sterile Barrier Technique was utilized including during the procedure including caps, mask, sterile gowns, sterile gloves, sterile drape, hand hygiene and skin antiseptic. A timeout was performed prior to the initiation of the procedure. The anesthesia team was present to provide general endotracheal tube anesthesia and for patient monitoring during the procedure. Interventional neuro radiology nursing staff was also present. FINDINGS: Initial Findings: Right common carotid artery:  Normal course caliber and contour. Right external carotid artery: Patent with antegrade flow. Right internal carotid artery: Normal course caliber and contour of the cervical portion. Vertical and petrous segment patent with normal course caliber contour. Cavernous segment patent. Clinoid segment patent. Antegrade flow of the ophthalmic artery. Ophthalmic segment patent. Terminus patent. Right MCA:  Right M1 occlusion Right ACA: A 1 segment patent. A 2 segment perfuses the right territory. Completion Findings: Right MCA: Complete restoration of flow through the right MCA TICI 3 Flat panel CT: No hemorrhage PROCEDURE: Patient is brought  emergently to the neuro angiography suite, with the patient identified appropriately and placed supine position on the table. Left radial arterial line was placed by the anesthesia team. The patient is then prepped and draped in the usual sterile fashion. Ultrasound survey of the right inguinal region was performed with images stored and sent to PACs. 11 blade scalpel was used to make a small incision. Blunt dissection was performed. A micropuncture needle was used access the right common femoral artery under ultrasound. With excellent arterial blood flow returned, an .018 micro wire was passed through the needle, observed to enter the abdominal aorta under fluoroscopy. The needle was removed, and a micropuncture sheath was placed over the wire. The inner dilator and wire were removed, and an 035 Bentson wire was advanced under fluoroscopy into the abdominal aorta. The sheath was removed and a standard 5 [place] vascular sheath was placed. The dilator was removed and the sheath was flushed. A 22F JB-1 diagnostic catheter was advanced over the wire to the proximal descending thoracic aorta. Wire was then removed. Double flush of the catheter was performed. Catheter was then used to select the right common carotid artery. Catheter was advanced over the wire into the cervical ICA. Wire was removed.  Double flush was performed. Formal angiogram was performed, with roadmap achieved. Exchange length Rosen wire was then passed through the diagnostic catheter to the distal cervical ICA and the diagnostic catheter was removed. The 5 French sheath was removed and exchanged for 8 French 55 centimeter BrightTip sheath. Sheath was flushed and attached to pressurized and heparinized saline bag for constant forward flow. Eight French 95 cm flow gate balloon catheter was then advanced over the wire to the distal cervical segment. Wire was removed. Then a coaxial intermediate catheter and microcatheter combination was prepared on the  back table. This combination was CAT-5 catheter and a Trevo Provue18 microcatheter, with a synchro soft wire. This combination was then advanced through the balloon guide into the ICA. System was advanced into the internal carotid artery, to the level of the occlusion. The micro wire was then carefully advanced through the occluded segment. Microcatheter was then pushed through the occluded segment and the wire was removed. Intermediate catheter was advanced over the micro wire to the M1 segment. Blood was then aspirated through the hub of the microcatheter, and a gentle contrast injection was performed confirming intraluminal position. A rotating hemostatic valve was then attached to the back end of the microcatheter, and a pressurized and heparinized saline bag was attached to the catheter. 4 x 40 solitaire device was then selected. Back flush was achieved at the rotating hemostatic valve, and then the device was gently advanced through the microcatheter to the distal end. The retriever was then unsheathed by withdrawing the microcatheter under fluoroscopy. Once the retriever was completely unsheathed, the microcatheter was carefully stripped from the delivery wire of the device. Control angiogram was then performed from the balloon catheter. 3 minute time interval was observed. The balloon on the balloon guide was then inflated to profile of the vessel. Constant aspiration was then performed through the intermediate catheter, and constant gentle aspiration was performed at the balloon guide. This aspiration was continued as the retriever was gently and slowly withdrawn with fluoroscopic observation into the distal intermediate catheter. The entire system was then gently withdrawn from the intracranial ICA and into the balloon guide. Once the retriever was entirely removed from the system, free aspiration was confirmed at the hub of the balloon guide, with free blood return confirmed. Control angiogram was then  performed. Persistent occlusion at the proximal MCA was confirmed. The microcatheter system was then advanced through the intermediate catheter. Once the micro wire microcatheter were beyond the occlusion, the micro wire was removed and stentreiver device was deployed, stripping the microcatheter from the wire. After the device was deployed across the occlusion, the intermediate catheter was placed at the site of occlusion. The balloon at the balloon guide was inflated to profile of the vessel. Local aspiration was performed at the intermediate catheter upon withdrawal of the device under fluoroscopic observation, with aspiration at the balloon guide. Once the retrieve was withdrawn to the face of the intermediate catheter, both the microcatheter in intermediate catheter were removed from the system. Free aspiration was confirmed at the hub of the balloon guide catheter, with free blood return confirmed. Control angiogram was again performed. Persistent occlusion was identified. We proceeded with a third attempt. The microcatheter system was then advanced through a new intermediate catheter, using Ace 64 catheter. Once the micro wire microcatheter, and intermediate catheter were advanced to the carotid siphon, the microwire and microcatheter were advanced beyond the occlusion. The wire was removed and a new device was deployed, using EMBO trap  5 x 33, stripping the microcatheter from the wire. After the device was deployed across the occlusion, the Ace catheter was placed at the site of occlusion. The balloon at the balloon guide was inflated to profile of the vessel. Local aspiration was performed at the intermediate catheter upon withdrawal of the device under fluoroscopic observation, with aspiration at the balloon guide. Once the retrieve her was entirely removed from the system, free aspiration was confirmed at the hub of the intermediate catheter, with free blood return confirmed. Balloon on the balloon guide  was deflated. Control angiogram was performed. Additional views were performed with multiple obliquities. TI CI 3 was confirmed. Balloon guide was removed, and the bright tip sheath was removed. Eight [place] Angio-Seal was deployed. Flat panel CT was performed. This was reviewed and the patient was extubated. Patient tolerated the procedure well and remained hemodynamically stable throughout. No complications were encountered. Estimated blood loss approximately 100 cc. IMPRESSION: Status post ultrasound-guided access of right common femoral artery for cervical and cerebral angiogram, with mechanical thrombectomy of right M1 occlusion and restoration of TICI 3 flow after 3 passes. Deployment of Angio-Seal for hemostasis. Signed, Dulcy Fanny. Dellia Nims, RPVI [facility] Radiology PLAN: Right hip straight overnight Target blood pressure 1 35-573 systolic Frequent neurovascular checks. Admission to ICU. Further neuroimaging at the discretion of neurology Electronically Signed   By: Corrie Mckusick D.O.   On: 05/17/2018 18:03   Ct Head Code Stroke Wo Contrast  Result Date: 05/17/2018 CLINICAL DATA:  Code stroke.  Left-sided flaccid.  Facial droop EXAM: CT HEAD WITHOUT CONTRAST TECHNIQUE: Contiguous axial images were obtained from the base of the skull through the vertex without intravenous contrast. COMPARISON:  None. FINDINGS: Brain: Image quality degraded by motion. Ventricle size normal. Negative for hemorrhage or mass. No evidence of acute infarct however sensitivity is decreased due to motion. Vascular: Hyperdense right middle cerebral artery distal right M1 and M2 segments. This is best seen on coronal and sagittal images and is difficult to see on the axial images due to motion. Skull: Negative Sinuses/Orbits: Mild mucosal edema paranasal sinuses. Negative orbit Other: None ASPECTS (Wilmer Stroke Program Early CT Score) - Ganglionic level infarction (caudate,  lentiform nuclei, internal capsule, insula, M1-M3 cortex): 7 - Supraganglionic infarction (M4-M6 cortex): 3 Total score (0-10 with 10 being normal): 10 IMPRESSION: 1. Hyperdense right MCA suggestive of acute thrombosis. Negative for hemorrhage. No CT evidence of acute infarct however sensitivity is diminished due to considerable motion. 2. ASPECTS is 10 3. These results were called by telephone at the time of interpretation on 05/17/2018 at 1:56 pm to Dr. Leonel Ramsay , who verbally acknowledged these results. Electronically Signed   By: Franchot Gallo M.D.   On: 05/17/2018 13:57    Labs:  CBC: Recent Labs    05/17/18 1340 05/18/18 0617 05/19/18 0415 05/20/18 0541  WBC 6.4 9.8 8.9 8.4  HGB 16.0 14.6 14.1 14.6  HCT 49.2 43.5 42.2 44.2  PLT 246 242 205 212    COAGS: Recent Labs    05/17/18 1340  INR 1.06  APTT 27    BMP: Recent Labs    05/17/18 1340 05/17/18 1353 05/18/18 0617 05/19/18 0415 05/20/18 0541  NA 139  --  138 137 137  K 3.9  --  4.0 4.7 4.6  CL 105  --  106 102 102  CO2 28  --  20* 24 26  GLUCOSE 60*  --  162* 122* 106*  BUN  14  --  12 15 16   CALCIUM 9.4  --  8.5* 8.7* 8.9  CREATININE 0.93 0.90 1.00 0.97 0.97  GFRNONAA >60  --  >60 >60 >60  GFRAA >60  --  >60 >60 >60    LIVER FUNCTION TESTS: Recent Labs    05/17/18 1340  BILITOT 0.8  AST 23  ALT 21  ALKPHOS 61  PROT 6.7  ALBUMIN 4.1    Assessment and Plan:  Right MCA M1 segment occlusion s/p emergent mechanical thrombectomy achieving a TICI 3 revascularization 05/17/2018 by Dr. Earleen Newport. Patient's condition improving- no residual neurologic symptoms noted. Right groin incision stable. Appreciate and agree with neurology management. Please call IR with questions/concerns.   Electronically Signed: Earley Abide, PA-C 05/20/2018, 9:41 AM   I spent a total of 25 Minutes at the the patient's bedside AND on the patient's hospital floor or unit, greater than 50% of which was  counseling/coordinating care for right MCA M1 segment occlusion s/p revascularization.

## 2018-05-20 NOTE — Care Management Important Message (Signed)
Important Message  Patient Details  Name: Patrick Salinas MRN: [id number] Date of Birth: 11/23/1950   Medicare Important Message Given:  Yes    [name] 05/20/2018, 4:15 PM

## 2018-05-20 NOTE — Consult Note (Signed)
            [facility] CM Primary Care Navigator  05/20/2018  DARRIEL UTTER 1950-12-26 [id number]   Wenttoseepatient at the bedsideto identify possible discharge needs buthe was alreadydischarge per staff.  PerInpatient CM note, patient was discharge home with his spouse who can provide supervision.  Per MD note, patient was admitted for further evaluation and management of acute embolic stroke, status post tPA and mechanical thrombectomy. (atrial fibrillation, hyperlipidemia, essential hypertension)  Primary care provider's officeis listed asprovidingtransition of care (TOC) follow-up.  Patient hasdischarge instruction to follow-up with primary care provider in 2 weeks, cardiology on 06/13/18 and neurology follow-up in 6 weeks.  Noted order for EMMI stroke calls already in place after discharge.   For additional questions please contact:  Edwena Felty A. Noah Lembke, BSN, RN-BC [facility] PRIMARY CARE Navigator Cell: [phone]

## 2018-05-20 NOTE — Progress Notes (Addendum)
Progress Note  Patient Name: Patrick Salinas Date of Encounter: 05/20/2018  Primary Cardiologist: Candee Furbish, MD   Subjective   Continues to do quite well.  No significant residual stroke symptoms noted.  No chest pain, no shortness of breath.  No awareness of atrial fibrillation.  Under good rate control with diltiazem.  Inpatient Medications    Scheduled Meds: . apixaban  5 mg Oral BID  . diltiazem  120 mg Oral Daily  . pantoprazole  40 mg Oral Daily  . rosuvastatin  20 mg Oral q1800  . sodium chloride flush  3 mL Intravenous Once   Continuous Infusions:  PRN Meds: acetaminophen **OR** [DISCONTINUED] acetaminophen (TYLENOL) oral liquid 160 mg/5 mL **OR** [DISCONTINUED] acetaminophen, senna-docusate   Vital Signs    Vitals:   05/19/18 2305 05/19/18 2351 05/20/18 0325 05/20/18 0719  BP: (!) 126/91 128/90 (!) 135/92 (!) 141/101  Pulse: 86 (!) 48 80 80  Resp: 17  18 18   Temp: 97.9 F (36.6 C) 97.9 F (36.6 C) 98.1 F (36.7 C) 98.4 F (36.9 C)  TempSrc: Oral Oral Oral Oral  SpO2: 97% 96% 97% 96%  Weight:      Height:        Intake/Output Summary (Last 24 hours) at 05/20/2018 0801 Last data filed at 05/20/2018 0340 Gross per 24 hour  Intake -  Output 550 ml  Net -550 ml   Last 3 Weights 05/17/2018 05/17/2018 11/02/2014  Weight (lbs) 212 lb 1.3 oz 211 lb 10.3 oz 198 lb  Weight (kg) 96.2 kg 96 kg 89.812 kg      Telemetry  Atrial fibrillation with rate controlled mainly in the 70s to 80s- Personally Reviewed  ECG    Prior EKG atrial fibrillation 110- Personally Reviewed  Physical Exam   GEN: Well nourished, well developed, in no acute distress  HEENT: normal  Neck: no JVD, carotid bruits, or masses Cardiac: Irregularly irregular; no murmurs, rubs, or gallops,no edema  Respiratory:  clear to auscultation bilaterally, normal work of breathing GI: soft, nontender, nondistended, + BS MS: no deformity or atrophy  Skin: warm and dry, no rash Neuro:  Alert and  Oriented x 3, Strength and sensation are intact Psych: euthymic mood, full affect   Labs    Chemistry Recent Labs  Lab 05/17/18 1340  05/18/18 0617 05/19/18 0415 05/20/18 0541  NA 139  --  138 137 137  K 3.9  --  4.0 4.7 4.6  CL 105  --  106 102 102  CO2 28  --  20* 24 26  GLUCOSE 60*  --  162* 122* 106*  BUN 14  --  12 15 16   CREATININE 0.93   < > 1.00 0.97 0.97  CALCIUM 9.4  --  8.5* 8.7* 8.9  PROT 6.7  --   --   --   --   ALBUMIN 4.1  --   --   --   --   AST 23  --   --   --   --   ALT 21  --   --   --   --   ALKPHOS 61  --   --   --   --   BILITOT 0.8  --   --   --   --   GFRNONAA >60  --  >60 >60 >60  GFRAA >60  --  >60 >60 >60  ANIONGAP 6  --  12 11 9    < > = values  in this interval not displayed.     Hematology Recent Labs  Lab 05/18/18 0617 05/19/18 0415 05/20/18 0541  WBC 9.8 8.9 8.4  RBC 4.77 4.61 4.87  HGB 14.6 14.1 14.6  HCT 43.5 42.2 44.2  MCV 91.2 91.5 90.8  MCH 30.6 30.6 30.0  MCHC 33.6 33.4 33.0  RDW 13.2 13.5 13.2  PLT 242 205 212    Cardiac EnzymesNo results for input(s): TROPONINI in the last 168 hours. No results for input(s): TROPIPOC in the last 168 hours.   BNPNo results for input(s): BNP, PROBNP in the last 168 hours.   DDimer No results for input(s): DDIMER in the last 168 hours.   Radiology    Mr Jodene Nam Head Wo Contrast  Result Date: 05/18/2018 CLINICAL DATA:  Follow-up stroke after endovascular revascularization of RIGHT M1 occlusion. History of atrial fibrillation, hypercholesterolemia and prostate cancer. EXAM: MRI HEAD WITHOUT CONTRAST MRA HEAD WITHOUT CONTRAST TECHNIQUE: Multiplanar, multiecho pulse sequences of the brain and surrounding structures were obtained without intravenous contrast. Angiographic images of the head were obtained using MRA technique without contrast. COMPARISON:  CT and CTA HEAD May 17, 2018 FINDINGS: MRI HEAD FINDINGS INTRACRANIAL CONTENTS: Patchy areas of RIGHT frontal, temporal and parietal lobe  discontinuous reduced diffusion including RIGHT operculum and insula with low ADC values and faint increased FLAIR T2 hyperintense signal. No susceptibility artifact to suggest hemorrhage. No midline shift, mass effect or masses. No parenchymal brain volume loss for age. No hydrocephalus. No abnormal extra-axial fluid collections. VASCULAR: Major intracranial vascular flow voids present at skull base. Dolichoectasia seen with chronic hypertension. SKULL AND UPPER CERVICAL SPINE: No abnormal sellar expansion. No suspicious calvarial bone marrow signal. Severe C3-4 spondylosis. Craniocervical junction maintained. SINUSES/ORBITS: Trace mastoid effusions with trace paranasal sinus mucosal thickening.The included ocular globes and orbital contents are non-suspicious. OTHER: None. MRA HEAD FINDINGS ANTERIOR CIRCULATION: Normal flow related enhancement of the included cervical, petrous, cavernous and supraclinoid internal carotid arteries. Patent anterior communicating artery. Patent anterior and middle cerebral arteries. Short segment of partial decreased flow related enhancement LEFT proximal M2 associated with pulsation artifact. No large vessel occlusion, flow limiting stenosis, aneurysm. POSTERIOR CIRCULATION: Codominant vertebral arteries. Vertebrobasilar arteries are patent, with normal flow related enhancement of the main branch vessels. Patent posterior cerebral arteries. Robust bilateral posterior communicating arteries, fetal origin on the RIGHT. No large vessel occlusion, flow limiting stenosis,  aneurysm. ANATOMIC VARIANTS: None. Source images and MIP images were reviewed. IMPRESSION: MRI HEAD: 1. Acute multifocal small RIGHT MCA territory and RIGHT posterior border zone nonhemorrhagic infarcts. MRA HEAD: 1. Status post successful endovascular revascularization of RIGHT M1 occlusion; no emergent large vessel occlusion. 2. Poor flow related enhancement LEFT M2 segment favored as artifact, less likely new  stenosis. Electronically Signed   By: Elon Alas M.D.   On: 05/18/2018 13:08   Mr Brain Wo Contrast  Result Date: 05/18/2018 CLINICAL DATA:  Follow-up stroke after endovascular revascularization of RIGHT M1 occlusion. History of atrial fibrillation, hypercholesterolemia and prostate cancer. EXAM: MRI HEAD WITHOUT CONTRAST MRA HEAD WITHOUT CONTRAST TECHNIQUE: Multiplanar, multiecho pulse sequences of the brain and surrounding structures were obtained without intravenous contrast. Angiographic images of the head were obtained using MRA technique without contrast. COMPARISON:  CT and CTA HEAD May 17, 2018 FINDINGS: MRI HEAD FINDINGS INTRACRANIAL CONTENTS: Patchy areas of RIGHT frontal, temporal and parietal lobe discontinuous reduced diffusion including RIGHT operculum and insula with low ADC values and faint increased FLAIR T2 hyperintense signal. No susceptibility artifact to suggest hemorrhage. No  midline shift, mass effect or masses. No parenchymal brain volume loss for age. No hydrocephalus. No abnormal extra-axial fluid collections. VASCULAR: Major intracranial vascular flow voids present at skull base. Dolichoectasia seen with chronic hypertension. SKULL AND UPPER CERVICAL SPINE: No abnormal sellar expansion. No suspicious calvarial bone marrow signal. Severe C3-4 spondylosis. Craniocervical junction maintained. SINUSES/ORBITS: Trace mastoid effusions with trace paranasal sinus mucosal thickening.The included ocular globes and orbital contents are non-suspicious. OTHER: None. MRA HEAD FINDINGS ANTERIOR CIRCULATION: Normal flow related enhancement of the included cervical, petrous, cavernous and supraclinoid internal carotid arteries. Patent anterior communicating artery. Patent anterior and middle cerebral arteries. Short segment of partial decreased flow related enhancement LEFT proximal M2 associated with pulsation artifact. No large vessel occlusion, flow limiting stenosis, aneurysm. POSTERIOR  CIRCULATION: Codominant vertebral arteries. Vertebrobasilar arteries are patent, with normal flow related enhancement of the main branch vessels. Patent posterior cerebral arteries. Robust bilateral posterior communicating arteries, fetal origin on the RIGHT. No large vessel occlusion, flow limiting stenosis,  aneurysm. ANATOMIC VARIANTS: None. Source images and MIP images were reviewed. IMPRESSION: MRI HEAD: 1. Acute multifocal small RIGHT MCA territory and RIGHT posterior border zone nonhemorrhagic infarcts. MRA HEAD: 1. Status post successful endovascular revascularization of RIGHT M1 occlusion; no emergent large vessel occlusion. 2. Poor flow related enhancement LEFT M2 segment favored as artifact, less likely new stenosis. Electronically Signed   By: Elon Alas M.D.   On: 05/18/2018 13:08    Cardiac Studies   Echocardiogram demonstrates normal ejection fraction, mild aortic valve calcification, mild mitral and tricuspid regurgitation.  Normal left atrial size.  Patient Profile     68 y.o. male with newly discovered atrial fibrillation, right MCA stroke, small stroke burden noted on MRI no hemorrhage,  hypertension hyperlipidemia  Assessment & Plan    Atrial fibrillation paroxysmal - Eliquis 5 mg twice a day given low stroke burden on MRI.  See neurology notes. - Diltiazem CD 120 mg once a day for rate control. -CHADSVASc 4, age greater than 47, hypertension, stroke -Echocardiogram reassuring, no embolic source identified, normal EF, normal left atrial size. - After 1 month of anticoagulation, if he is still having atrial fibrillation, I would like to give him an opportunity at sinus rhythm with cardioversion which can be performed without TEE if he has been fully anticoagulated for greater than 3 weeks without interruption.  We will see how he does as an outpatient.  There is a chance that he may auto convert.  It is unknown how long he has been in atrial fibrillation prior to his  stroke.  He does remember feeling some prewarning signs a few days prior to his major event.  Hyperlipidemia - Used to have a problem with atorvastatin, worried about blood sugars being increased with this medication. - I have started him on Crestor 20 mg once a day for high intensity statin use given his stroke.  Continue to monitor for any side effects.  Co-Q10 is fine.  LDL was 154 hemoglobin A1c 5.7.  Seems to be tolerating well at this point.  Acute right MCA stroke - Transient left sided symptoms.  TPA administered.  Recurrence of symptoms.  Taken to interventional neuroradiology lab, thrombectomy occurred.  Excellent resolution of symptoms. -Continue with aggressive secondary risk factor prevention. -Has passed his speech and swallowing study.  Doing very well.    Appreciate the exceptional teamwork by the neurology team and neuro interventional team.  Okay for discharge from my perspective. We will have follow-up on March 19.  Scheduling is currently taking care of this. -Eliquis - Diltiazem -Crestor  -Consider outpatient sleep study in the future - If he still in atrial fibrillation clinic follow-up, we will set up cardioversion  For questions or updates, please contact [facility] Please consult [email or web address] for contact info under        Signed, Candee Furbish, MD  05/20/2018, 8:01 AM

## 2018-05-20 NOTE — Care Management (Signed)
#  1.   S/W  LIA  @ [facility] # [phone] OPT- MEMBER   ELIQUIS  5 MG BID COVER- YES CO-PAY-  $ 412.00 TIER- 3 DRUG PRIOR  APPROVAL-  NO  DEDUCTIBLE: NOT MET   PREFERRED PHARMACY : YES   [facility]

## 2018-05-20 NOTE — Plan of Care (Signed)
Adequate for discharge.

## 2018-05-20 NOTE — Care Management Note (Signed)
Case Management Note  Patient Details  Name: Patrick Salinas MRN: [id number] Date of Birth: 1950-06-13  Subjective/Objective:      Pt admitted with a stroke. He is from home with his spouse that can provide supervision. DME: none No issue with home medications and no issues with transportation.              Action/Plan: Pt discharging home with self care. No f/u per PT/OT and no DME needs.  Pt discharging home on Eliquis. CM provided 30 day free card. Pt is aware of high co pay until his deductible is met. Pt feels he can afford this. Wife will provide transportation home when d/ced.    Expected Discharge Date:                  Expected Discharge Plan:  Home/Self Care  In-House Referral:     Discharge planning Services  CM Consult(Eliquis card)  Post Acute Care Choice:    Choice offered to:     DME Arranged:    DME Agency:     HH Arranged:    HH Agency:     Status of Service:  Completed, signed off  If discussed at [employer] of Stay Meetings, dates discussed:    Additional Comments:  Pollie Friar, RN 05/20/2018, 1:08 PM

## 2018-05-21 NOTE — Anesthesia Postprocedure Evaluation (Signed)
Anesthesia Post Note  Patient: GARRETH BURNSWORTH  Procedure(s) Performed: IR WITH ANESTHESIA (N/A )     Patient location during evaluation: PACU Anesthesia Type: General Level of consciousness: awake and patient cooperative Pain management: pain level controlled Vital Signs Assessment: post-procedure vital signs reviewed and stable Respiratory status: spontaneous breathing, nonlabored ventilation, respiratory function stable and patient connected to nasal cannula oxygen Cardiovascular status: blood pressure returned to baseline and stable Postop Assessment: no apparent nausea or vomiting Anesthetic complications: no    Last Vitals:  Vitals:   05/20/18 0719 05/20/18 1251  BP: (!) 141/101 (!) 150/103  Pulse: 80 82  Resp: 18 18  Temp: 36.9 C   SpO2: 96% 94%    Last Pain:  Vitals:   05/20/18 0856  TempSrc:   PainSc: 0-No pain                 Lurine Imel

## 2018-05-29 ENCOUNTER — Encounter: Payer: Self-pay | Admitting: Cardiology

## 2018-05-29 DIAGNOSIS — R7303 Prediabetes: Secondary | ICD-10-CM | POA: Diagnosis not present

## 2018-05-29 DIAGNOSIS — I4891 Unspecified atrial fibrillation: Secondary | ICD-10-CM | POA: Diagnosis not present

## 2018-05-29 DIAGNOSIS — I639 Cerebral infarction, unspecified: Secondary | ICD-10-CM | POA: Diagnosis not present

## 2018-05-29 DIAGNOSIS — E7800 Pure hypercholesterolemia, unspecified: Secondary | ICD-10-CM | POA: Diagnosis not present

## 2018-05-29 DIAGNOSIS — I10 Essential (primary) hypertension: Secondary | ICD-10-CM | POA: Diagnosis not present

## 2018-06-10 ENCOUNTER — Telehealth: Payer: Self-pay | Admitting: Cardiology

## 2018-06-10 NOTE — Telephone Encounter (Signed)
Spoke to patient.  He is currently doing very well.  Asymptomatic with his atrial fibrillation.  No symptoms.  Has medication refills for his Eliquis.  Plan at this follow-up visit was to get him set up for elective cardioversion.  This certainly can be delayed at this point.  He understands that the office will be in touch to reschedule this follow-up appointment.  Candee Furbish, MD

## 2018-06-13 ENCOUNTER — Ambulatory Visit: Payer: Medicare Other | Admitting: Cardiology

## 2018-07-11 ENCOUNTER — Telehealth: Payer: Self-pay

## 2018-07-11 NOTE — Telephone Encounter (Signed)
Pt was schedule for video hospital follow up. He call back the same day and cancel.Pt stated he Is feeling better and will call back when needed per the appt. He was documented. The RN did not speak with him. This is from the cancellation appt notes.

## 2018-07-11 NOTE — Telephone Encounter (Signed)
Spoke with the patient and received verbal consent to file his insurance and to do a webex visit. E-mail has been confirmed.

## 2018-07-18 ENCOUNTER — Ambulatory Visit: Payer: Medicare Other | Admitting: Neurology

## 2018-08-09 ENCOUNTER — Other Ambulatory Visit: Payer: Self-pay

## 2018-08-09 NOTE — Patient Outreach (Signed)
First attempt to obtain mRs. No answer. Left message for return call.  

## 2018-08-16 NOTE — Patient Outreach (Signed)
[facility]) Care Management  08/16/2018  QUASHAUN LAZALDE [birth year] [id number]   Telephone outreach to patient to obtain mRs was successfully completed. mRs= 0.  Dr. Tressie Ellis returned my call and stated he was having no issues post stroke.

## 2018-08-30 DIAGNOSIS — R7303 Prediabetes: Secondary | ICD-10-CM | POA: Diagnosis not present

## 2018-08-30 DIAGNOSIS — E7800 Pure hypercholesterolemia, unspecified: Secondary | ICD-10-CM | POA: Diagnosis not present

## 2018-10-09 DIAGNOSIS — C61 Malignant neoplasm of prostate: Secondary | ICD-10-CM | POA: Diagnosis not present

## 2018-10-16 DIAGNOSIS — N5201 Erectile dysfunction due to arterial insufficiency: Secondary | ICD-10-CM | POA: Diagnosis not present

## 2018-10-16 DIAGNOSIS — C61 Malignant neoplasm of prostate: Secondary | ICD-10-CM | POA: Diagnosis not present

## 2018-12-28 DIAGNOSIS — Z23 Encounter for immunization: Secondary | ICD-10-CM | POA: Diagnosis not present

## 2019-01-21 ENCOUNTER — Encounter: Payer: Self-pay | Admitting: Cardiology

## 2019-01-21 ENCOUNTER — Encounter (INDEPENDENT_AMBULATORY_CARE_PROVIDER_SITE_OTHER): Payer: Self-pay

## 2019-01-21 ENCOUNTER — Ambulatory Visit (INDEPENDENT_AMBULATORY_CARE_PROVIDER_SITE_OTHER): Payer: Medicare Other | Admitting: Cardiology

## 2019-01-21 ENCOUNTER — Other Ambulatory Visit: Payer: Self-pay

## 2019-01-21 VITALS — BP 110/70 | HR 92 | Ht 75.0 in | Wt 217.4 lb

## 2019-01-21 DIAGNOSIS — Z7901 Long term (current) use of anticoagulants: Secondary | ICD-10-CM

## 2019-01-21 DIAGNOSIS — I639 Cerebral infarction, unspecified: Secondary | ICD-10-CM | POA: Diagnosis not present

## 2019-01-21 DIAGNOSIS — I4819 Other persistent atrial fibrillation: Secondary | ICD-10-CM | POA: Diagnosis not present

## 2019-01-21 DIAGNOSIS — Z8673 Personal history of transient ischemic attack (TIA), and cerebral infarction without residual deficits: Secondary | ICD-10-CM

## 2019-01-21 NOTE — Patient Instructions (Signed)
Medication Instructions:  The current medical regimen is effective;  continue present plan and medications.  *If you need a refill on your cardiac medications before your next appointment, please call your pharmacy*  Follow-Up: At [facility], you and your health needs are our priority.  As part of our continuing mission to provide you with exceptional heart care, we have created designated Provider Care Teams.  These Care Teams include your primary Cardiologist (physician) and Advanced Practice Providers (APPs -  Physician Assistants and Nurse Practitioners) who all work together to provide you with the care you need, when you need it.  Your next appointment:   6 month(s)  The format for your next appointment:   In Person  Provider:   Mark Skains, MD  Thank you for choosing [facility]!!     

## 2019-01-21 NOTE — Progress Notes (Signed)
Cardiology Office Note:    Date:  01/21/2019   ID:  Patrick Salinas, DOB April 04, 1950, MRN [id number]  PCP:  [name], MD  Cardiologist:  Candee Furbish, MD  Electrophysiologist:  None   Referring MD: [name], MD     History of Present Illness:    Patrick Salinas is a 68 y.o. male with acute right MCA stroke in February 2020 with excellent resolution of symptoms following TPA then neuro intervention with asymptomatic atrial fibrillation under good rate control with diltiazem.  Prior to COVID-19, we had contemplated proceeding with cardioversion since this was his initial episode of atrial fibrillation detection.  His echocardiogram showed normal EF and normal left atrial size.  CHA2DS2-VASc was 4 for age greater than 1 hypertension and stroke  Diltiazem was used for rate control.  Crestor was used as a statin   Past Medical History:  Diagnosis Date  . Cancer [facility])    Prostate cancer -dx 5'16  . Hypercholesterolemia    no meds due to adverse reaction    Past Surgical History:  Procedure Laterality Date  . BIOPSY PROSTATE     5'16  . CERVICAL FUSION    . COLONOSCOPY W/ POLYPECTOMY     x2 benign  . IR CT HEAD LTD  05/17/2018  . IR PERCUTANEOUS ART THROMBECTOMY/INFUSION INTRACRANIAL INC DIAG ANGIO  05/17/2018  . IR US GUIDE VASC ACCESS RIGHT  05/17/2018  . RADIOLOGY WITH ANESTHESIA N/A 05/17/2018   Procedure: IR WITH ANESTHESIA;  Surgeon: Corrie Mckusick, DO;  Location: [facility];  Service: Anesthesiology;  Laterality: N/A;  . ROBOT ASSISTED LAPAROSCOPIC RADICAL PROSTATECTOMY N/A 11/02/2014   Procedure: ROBOTIC ASSISTED LAPAROSCOPIC RADICAL PROSTATECTOMY LEVEL 1;  Surgeon: Raynelle Bring, MD;  Location: [facility];  Service: Urology;  Laterality: N/A;  . TONSILLECTOMY      Current Medications: Current Meds  Medication Sig  . apixaban (ELIQUIS) 5 MG TABS tablet Take 1 tablet (5 mg total) by mouth 2 (two) times daily.  Marland Kitchen diltiazem (CARDIZEM CD) 120 MG 24 hr capsule Take 1  capsule (120 mg total) by mouth daily.  . rosuvastatin (CRESTOR) 20 MG tablet Take 1 tablet (20 mg total) by mouth daily at 6 PM.     Allergies:   Hctz [hydrochlorothiazide] and Lipitor [atorvastatin calcium]   Social History   Socioeconomic History  . Marital status: Married    Spouse name: Not on file  . Number of children: Not on file  . Years of education: Not on file  . Highest education level: Not on file  Occupational History  . Not on file  Social Needs  . Financial resource strain: Not on file  . Food insecurity    Worry: Not on file    Inability: Not on file  . Transportation needs    Medical: Not on file    Non-medical: Not on file  Tobacco Use  . Smoking status: Never Smoker  . Smokeless tobacco: Never Used  Substance and Sexual Activity  . Alcohol use: Yes    Comment: one drink daily  . Drug use: No  . Sexual activity: Not on file  Lifestyle  . Physical activity    Days per week: Not on file    Minutes per session: Not on file  . Stress: Not on file  Relationships  . Social Herbalist on phone: Not on file    Gets together: Not on file    Attends religious service: Not on file  Active member of club or organization: Not on file    Attends meetings of clubs or organizations: Not on file    Relationship status: Not on file  Other Topics Concern  . Not on file  Social History Narrative  . Not on file     Family History: The patient's family history is not on file.  No early family history of CAD  ROS:   Please see the history of present illness.     All other systems reviewed and are negative.  EKGs/Labs/Other Studies Reviewed:    The following studies were reviewed today: Prior echo-normal left atrial size normal EF  EKG: Prior EKG A. fib  Recent Labs: 05/17/2018: ALT 21 05/20/2018: BUN 16; Creatinine, Ser 0.97; Hemoglobin 14.6; Platelets 212; Potassium 4.6; Sodium 137  Recent Lipid Panel    Component Value Date/Time   CHOL  217 (H) 05/18/2018 0617   TRIG 66 05/18/2018 0617   HDL 50 05/18/2018 0617   CHOLHDL 4.3 05/18/2018 0617   VLDL 13 05/18/2018 0617   LDLCALC 154 (H) 05/18/2018 0617    Physical Exam:    VS:  BP 110/70   Pulse 92   Ht 6\' 3"  (1.905 m)   Wt 217 lb 6.4 oz (98.6 kg)   SpO2 91%   BMI 27.17 kg/m     Wt Readings from Last 3 Encounters:  01/21/19 217 lb 6.4 oz (98.6 kg)  05/17/18 212 lb 1.3 oz (96.2 kg)  11/02/14 198 lb (89.8 kg)     GEN:  Well nourished, well developed in no acute distress HEENT: Normal NECK: No JVD; No carotid bruits LYMPHATICS: No lymphadenopathy CARDIAC: IRRR, no murmurs, rubs, gallops RESPIRATORY:  Clear to auscultation without rales, wheezing or rhonchi  ABDOMEN: Soft, non-tender, non-distended MUSCULOSKELETAL:  No edema; No deformity  SKIN: Warm and dry NEUROLOGIC:  Alert and oriented x 3 PSYCHIATRIC:  Normal affect   ASSESSMENT:    1. Persistent atrial fibrillation ([place])   2. Chronic anticoagulation   3. History of stroke    PLAN:    In order of problems listed above:  Persistent atrial fibrillation -Under reasonable rate control.  Diltiazem.  No changes made.  Since he has been his atrial fibrillation for at least 1 year without any symptoms, we will continue with rate control.  We did discuss the possibility of rhythm control but at this point, he would like to just maintain excellent rate control.  He is playing tennis 3 days a week without any difficulty.  Chronic anticoagulation -Continue with Eliquis.  Right MCA stroke -Eliquis, statin   Medication Adjustments/Labs and Tests Ordered: Current medicines are reviewed at length with the patient today.  Concerns regarding medicines are outlined above.  No orders of the defined types were placed in this encounter.  No orders of the defined types were placed in this encounter.   Patient Instructions  Medication Instructions:  The current medical regimen is effective;  continue present  plan and medications.  *If you need a refill on your cardiac medications before your next appointment, please call your pharmacy*  Follow-Up: At [facility], you and your health needs are our priority.  As part of our continuing mission to provide you with exceptional heart care, we have created designated Provider Care Teams.  These Care Teams include your primary Cardiologist (physician) and Advanced Practice Providers (APPs -  Physician Assistants and Nurse Practitioners) who all work together to provide you with the care you need, when you need  it.  Your next appointment:   6 months  The format for your next appointment:   In Person  Provider:   Candee Furbish, MD  Thank you for choosing [facility]!!        Signed, Candee Furbish, MD  01/21/2019 5:13 PM    [place]

## 2019-04-05 DIAGNOSIS — Z20822 Contact with and (suspected) exposure to covid-19: Secondary | ICD-10-CM | POA: Diagnosis not present

## 2019-04-05 DIAGNOSIS — B349 Viral infection, unspecified: Secondary | ICD-10-CM | POA: Diagnosis not present

## 2019-04-06 ENCOUNTER — Telehealth: Payer: Self-pay | Admitting: Physician Assistant

## 2019-04-06 ENCOUNTER — Emergency Department (HOSPITAL_COMMUNITY): Payer: Medicare Other

## 2019-04-06 ENCOUNTER — Other Ambulatory Visit: Payer: Self-pay

## 2019-04-06 ENCOUNTER — Emergency Department (HOSPITAL_COMMUNITY)
Admission: EM | Admit: 2019-04-06 | Discharge: 2019-04-06 | Disposition: A | Discharge status: Home / Self Care | Payer: Medicare Other | Attending: Emergency Medicine | Admitting: Emergency Medicine

## 2019-04-06 DIAGNOSIS — R55 Syncope and collapse: Secondary | ICD-10-CM | POA: Insufficient documentation

## 2019-04-06 DIAGNOSIS — Z79899 Other long term (current) drug therapy: Secondary | ICD-10-CM | POA: Diagnosis not present

## 2019-04-06 DIAGNOSIS — J069 Acute upper respiratory infection, unspecified: Secondary | ICD-10-CM | POA: Insufficient documentation

## 2019-04-06 DIAGNOSIS — Z20822 Contact with and (suspected) exposure to covid-19: Secondary | ICD-10-CM | POA: Diagnosis not present

## 2019-04-06 DIAGNOSIS — B9789 Other viral agents as the cause of diseases classified elsewhere: Secondary | ICD-10-CM | POA: Diagnosis not present

## 2019-04-06 DIAGNOSIS — E860 Dehydration: Secondary | ICD-10-CM | POA: Insufficient documentation

## 2019-04-06 DIAGNOSIS — I4821 Permanent atrial fibrillation: Secondary | ICD-10-CM | POA: Diagnosis not present

## 2019-04-06 DIAGNOSIS — I10 Essential (primary) hypertension: Secondary | ICD-10-CM | POA: Insufficient documentation

## 2019-04-06 DIAGNOSIS — R0989 Other specified symptoms and signs involving the circulatory and respiratory systems: Secondary | ICD-10-CM | POA: Diagnosis present

## 2019-04-06 DIAGNOSIS — R9431 Abnormal electrocardiogram [ECG] [EKG]: Secondary | ICD-10-CM | POA: Diagnosis not present

## 2019-04-06 DIAGNOSIS — Z7901 Long term (current) use of anticoagulants: Secondary | ICD-10-CM | POA: Insufficient documentation

## 2019-04-06 DIAGNOSIS — Z8546 Personal history of malignant neoplasm of prostate: Secondary | ICD-10-CM | POA: Diagnosis not present

## 2019-04-06 DIAGNOSIS — I4819 Other persistent atrial fibrillation: Secondary | ICD-10-CM | POA: Insufficient documentation

## 2019-04-06 DIAGNOSIS — R0602 Shortness of breath: Secondary | ICD-10-CM | POA: Diagnosis not present

## 2019-04-06 DIAGNOSIS — Z8673 Personal history of transient ischemic attack (TIA), and cerebral infarction without residual deficits: Secondary | ICD-10-CM | POA: Diagnosis not present

## 2019-04-06 LAB — RESPIRATORY PANEL BY RT PCR (FLU A&B, COVID)
Influenza A by PCR: NEGATIVE
Influenza B by PCR: NEGATIVE
SARS Coronavirus 2 by RT PCR: NEGATIVE

## 2019-04-06 LAB — CBC
HCT: 51 % (ref 39.0–52.0)
Hemoglobin: 17 g/dL (ref 13.0–17.0)
MCH: 31 pg (ref 26.0–34.0)
MCHC: 33.3 g/dL (ref 30.0–36.0)
MCV: 92.9 fL (ref 80.0–100.0)
Platelets: 228 10*3/uL (ref 150–400)
RBC: 5.49 MIL/uL (ref 4.22–5.81)
RDW: 12.4 % (ref 11.5–15.5)
WBC: 8.9 10*3/uL (ref 4.0–10.5)
nRBC: 0 % (ref 0.0–0.2)

## 2019-04-06 LAB — POC SARS CORONAVIRUS 2 AG -  ED: SARS Coronavirus 2 Ag: NEGATIVE

## 2019-04-06 LAB — BASIC METABOLIC PANEL
Anion gap: 10 (ref 5–15)
BUN: 17 mg/dL (ref 8–23)
CO2: 26 mmol/L (ref 22–32)
Calcium: 9.3 mg/dL (ref 8.9–10.3)
Chloride: 102 mmol/L (ref 98–111)
Creatinine, Ser: 1.2 mg/dL (ref 0.61–1.24)
GFR calc Af Amer: >60 mL/min (ref >60)
GFR calc non Af Amer: >60 mL/min (ref >60)
Glucose, Bld: 131 mg/dL — ABNORMAL HIGH (ref 70–99)
Potassium: 4.6 mmol/L (ref 3.5–5.1)
Sodium: 138 mmol/L (ref 135–145)

## 2019-04-06 LAB — TROPONIN I (HIGH SENSITIVITY): Troponin I (High Sensitivity): 3 ng/L (ref <18)

## 2019-04-06 MED ORDER — SODIUM CHLORIDE 0.9 % IV BOLUS
1000.0000 mL | Freq: Once | INTRAVENOUS | Status: AC
  Administered 2019-04-06: 1000 mL via INTRAVENOUS

## 2019-04-06 MED ORDER — APIXABAN 5 MG PO TABS
5.0000 mg | ORAL_TABLET | Freq: Two times a day (BID) | ORAL | Status: DC
  Administered 2019-04-06: 20:00:00 5 mg via ORAL
  Filled 2019-04-06: qty 1

## 2019-04-06 NOTE — ED Provider Notes (Signed)
[facility] EMERGENCY DEPARTMENT Provider Note   CSN: [id number] Arrival date & time: 04/06/19  1537     History Chief Complaint  Patient presents with  . Nasal Congestion    Patrick Salinas is a 69 y.o. male.  Pt presents to the ED today with runny nose, and not feeling well.  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  The next day, pt developed a runny nose and congestion.  The pt has tried afrin, but that has not been helping his runny nose.  Pt noticed some wheezing last night.  He said he could not sleep b/c his nose was running so much.  The pt denies f/c.  He did go to an UC yesterday and had a rapid Ag test which was negative.  He had a near-syncopal event today and lost about 3-4 pounds overnight.  He thinks he's been eating and drinking well.          Past Medical History:  Diagnosis Date  . Cancer [facility])    Prostate cancer -dx 5'16  . Hypercholesterolemia    no meds due to adverse reaction    Patient Active Problem List   Diagnosis Date Noted  . Atrial fibrillation ([place]) 05/20/2018  . Hyperlipidemia LDL goal <70 05/20/2018  . Essential hypertension 05/20/2018  . Embolic stroke involving right middle cerebral artery (HCC) s/p tPA and mechanical thrombectomy, d/t AF 05/17/2018  . Prostate cancer ([place]) 11/02/2014    Past Surgical History:  Procedure Laterality Date  . BIOPSY PROSTATE     5'16  . CERVICAL FUSION    . COLONOSCOPY W/ POLYPECTOMY     x2 benign  . IR CT HEAD LTD  05/17/2018  . IR PERCUTANEOUS ART THROMBECTOMY/INFUSION INTRACRANIAL INC DIAG ANGIO  05/17/2018  . IR US GUIDE VASC ACCESS RIGHT  05/17/2018  . RADIOLOGY WITH ANESTHESIA N/A 05/17/2018   Procedure: IR WITH ANESTHESIA;  Surgeon: Corrie Mckusick, DO;  Location: [facility];  Service: Anesthesiology;  Laterality: N/A;  . ROBOT ASSISTED LAPAROSCOPIC RADICAL PROSTATECTOMY N/A 11/02/2014   Procedure: ROBOTIC ASSISTED LAPAROSCOPIC  RADICAL PROSTATECTOMY LEVEL 1;  Surgeon: Raynelle Bring, MD;  Location: [facility];  Service: Urology;  Laterality: N/A;  . TONSILLECTOMY         No family history on file.  Social History   Tobacco Use  . Smoking status: Never Smoker  . Smokeless tobacco: Never Used  Substance Use Topics  . Alcohol use: Yes    Comment: one drink daily  . Drug use: No    Home Medications Prior to Admission medications   Medication Sig Start Date End Date Taking? Authorizing Provider  apixaban (ELIQUIS) 5 MG TABS tablet Take 1 tablet (5 mg total) by mouth 2 (two) times daily. 05/20/18  Yes Donzetta Starch, NP  diltiazem (CARDIZEM CD) 120 MG 24 hr capsule Take 1 capsule (120 mg total) by mouth daily. 05/21/18  Yes Donzetta Starch, NP  rosuvastatin (CRESTOR) 20 MG tablet Take 1 tablet (20 mg total) by mouth daily at 6 PM. 05/20/18  Yes Biby, Massie Kluver, NP    Allergies    Hctz [hydrochlorothiazide], Lipitor [atorvastatin calcium], and Pineapple  Review of Systems   Review of Systems  HENT: Positive for congestion.   Respiratory: Positive for wheezing.   All other systems reviewed and are negative.   Physical Exam Updated Vital Signs BP (!) 158/107   Pulse  94   Temp 98.1 F (36.7 C) (Oral)   Resp (!) 21   SpO2 98%   Physical Exam Vitals and nursing note reviewed.  Constitutional:      Appearance: Normal appearance.  HENT:     Head: Normocephalic and atraumatic.     Right Ear: External ear normal.     Left Ear: External ear normal.     Nose: Nose normal.     Mouth/Throat:     Mouth: Mucous membranes are moist.     Pharynx: Oropharynx is clear.  Eyes:     Extraocular Movements: Extraocular movements intact.     Conjunctiva/sclera: Conjunctivae normal.     Pupils: Pupils are equal, round, and reactive to light.  Cardiovascular:     Rate and Rhythm: Normal rate and regular rhythm.     Pulses: Normal pulses.     Heart sounds: Normal heart sounds.  Pulmonary:     Effort: Pulmonary effort  is normal.     Breath sounds: Normal breath sounds.  Abdominal:     General: Abdomen is flat. Bowel sounds are normal.     Palpations: Abdomen is soft.  Musculoskeletal:        General: Normal range of motion.     Cervical back: Normal range of motion and neck supple.  Skin:    General: Skin is warm.     Capillary Refill: Capillary refill takes less than 2 seconds.  Neurological:     General: No focal deficit present.     Mental Status: He is alert and oriented to person, place, and time.  Psychiatric:        Mood and Affect: Mood normal.        Behavior: Behavior normal.        Thought Content: Thought content normal.        Judgment: Judgment normal.     ED Results / Procedures / Treatments   Labs (all labs ordered are listed, but only abnormal results are displayed) Labs Reviewed  BASIC METABOLIC PANEL - Abnormal; Notable for the following components:      Result Value   Glucose, Bld 131 (*)    All other components within normal limits  RESPIRATORY PANEL BY RT PCR (FLU A&B, COVID)  CBC  POC SARS CORONAVIRUS 2 AG -  ED  TROPONIN I (HIGH SENSITIVITY)  TROPONIN I (HIGH SENSITIVITY)    EKG EKG Interpretation  Date/Time:  Sunday April 06 2019 15:53:50 EST Ventricular Rate:  104 PR Interval:    QRS Duration: 76 QT Interval:  328 QTC Calculation: 431 R Axis:   65 Text Interpretation: Atrial fibrillation with rapid ventricular response Septal infarct , age undetermined Abnormal ECG No significant change since last tracing Confirmed by Isla Pence [phone]) on 04/06/2019 4:34:56 PM   Radiology DG Chest Portable 1 View  Result Date: 04/06/2019 CLINICAL DATA:  Shortness of breath. EXAM: PORTABLE CHEST 1 VIEW COMPARISON:  None. FINDINGS: Appears to be mild cardiomegaly. However, heart size is poorly assessed due to portable technique. The hila and mediastinum are normal. No pneumothorax. The lungs are clear. IMPRESSION: No active disease. Electronically Signed   By:  Dorise Bullion III M.D   On: 04/06/2019 17:15    Procedures Procedures (including critical care time)  Medications Ordered in ED Medications  apixaban (ELIQUIS) tablet 5 mg (5 mg Oral Given 04/06/19 1941)  sodium chloride 0.9 % bolus 1,000 mL (1,000 mLs Intravenous New Bag/Given 04/06/19 1747)    ED Course  I have reviewed the triage vital signs and the nursing notes.  Pertinent labs & imaging results that were available during my care of the patient were reviewed by me and considered in my medical decision making (see chart for details).    MDM Rules/Calculators/A&P                     Pt is feeling better after IVFs.  HR is still slightly elevated.  His first troponin is negative.  He does not want to wait for another one.  Covid PCR/Flu A and B are negative.  He declines a broader RVP.  He is encouraged to drink a lot of fluids.  Return if worse.  He has an appt with cards on the 13th.    Patrick Salinas was evaluated in Emergency Department on 04/06/2019 for the symptoms described in the history of present illness. He was evaluated in the context of the global COVID-19 pandemic, which necessitated consideration that the patient might be at risk for infection with the SARS-CoV-2 virus that causes COVID-19. Institutional protocols and algorithms that pertain to the evaluation of patients at risk for COVID-19 are in a state of rapid change based on information released by regulatory bodies including the CDC and federal and state organizations. These policies and algorithms were followed during the patient's care in the ED. Final Clinical Impression(s) / ED Diagnoses Final diagnoses:  Viral upper respiratory tract infection  Dehydration  Persistent atrial fibrillation ([place])  COVID-19 virus not detected    Rx / DC Orders ED Discharge Orders    None       Isla Pence, MD 04/06/19 [phone]

## 2019-04-06 NOTE — Telephone Encounter (Signed)
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 The second episode may have had seizure-like activity. He "came around" so she did not call EMS for the second episode. He was not incontinent of urine or stool. I relayed that I was concerned about possible TIA even though he is on eliquis. She thinks he is ok to defer ER visit at this time, but wants a call from Dr. Marlou Porch to discuss tomorrow. I was able to schedule an appt for him at 4pm on Wed, but she would like a callback tomorrow, Monday.  Use: [phone]  I will relay to Dr. Marlou Porch.

## 2019-04-06 NOTE — ED Triage Notes (Signed)
Pt was around grandchild 5 days ago who had runny nose and nasal congestion.  Next day pt started having lots of nasal congestion, runny nose-yellow mucus.   Had negative rapid COVID test yesterday.  Last night started having wheezing.  Has lost 3-4  lbs overnight.   Talking in complete sentences.  No respiratory distress.   Has had no fever, cough, or chest pain.

## 2019-04-07 NOTE — Telephone Encounter (Signed)
Spoke to he and wife yesterday. Went to ER. Dehydration, Trop neg. Covid neg. CXR neg. ECG with AFIB 100. Has close follow up.  Candee Furbish, MD

## 2019-04-09 ENCOUNTER — Telehealth: Payer: Self-pay | Admitting: Radiology

## 2019-04-09 ENCOUNTER — Encounter: Payer: Self-pay | Admitting: Cardiology

## 2019-04-09 ENCOUNTER — Ambulatory Visit (INDEPENDENT_AMBULATORY_CARE_PROVIDER_SITE_OTHER): Payer: Medicare Other | Admitting: Cardiology

## 2019-04-09 ENCOUNTER — Other Ambulatory Visit: Payer: Self-pay

## 2019-04-09 VITALS — BP 138/90 | HR 78 | Ht 75.0 in | Wt 212.0 lb

## 2019-04-09 DIAGNOSIS — Z7901 Long term (current) use of anticoagulants: Secondary | ICD-10-CM

## 2019-04-09 DIAGNOSIS — R55 Syncope and collapse: Secondary | ICD-10-CM

## 2019-04-09 DIAGNOSIS — I4819 Other persistent atrial fibrillation: Secondary | ICD-10-CM | POA: Diagnosis not present

## 2019-04-09 DIAGNOSIS — Z8673 Personal history of transient ischemic attack (TIA), and cerebral infarction without residual deficits: Secondary | ICD-10-CM | POA: Diagnosis not present

## 2019-04-09 MED ORDER — DILTIAZEM HCL ER COATED BEADS 120 MG PO CP24: 120.0000 mg | ORAL_CAPSULE | Freq: Every day | ORAL | 11 refills | Status: DC

## 2019-04-09 NOTE — Telephone Encounter (Signed)
Enrolled patient for a 14 day Zio monitor to be mailed to patients home.  

## 2019-04-09 NOTE — Progress Notes (Signed)
Cardiology Office Note:    Date:  04/09/2019   ID:  Patrick Salinas, DOB 1950-12-17, MRN [id number]  PCP:  [name], MD  Cardiologist:  Candee Furbish, MD  Electrophysiologist:  None   Referring MD: [name], MD     History of Present Illness:    Patrick Salinas is a 69 y.o. male with acute right MCA stroke in February 2020 with excellent resolution of symptoms following TPA then neuro intervention with asymptomatic atrial fibrillation under rate control with diltiazem here for follow-up.  Prior to COVID-19, we had contemplated proceeding with cardioversion since this was his initial episode of atrial fibrillation detection.  As time passed, we decided to continue with rate control strategy since he was feeling well, playing tennis for instance.  His initial echocardiogram showed normal EF and normal left atrial size.  CHA2DS2-VASc was 4 for age greater than 74 hypertension and stroke  Diltiazem was used for rate control.  Crestor was used as a statin  Unfortunately recently he had 2 episodes of near syncope, once in the middle the night surrounding going to the bathroom.  He was leaned over, wife Patrick Salinas a retired pediatrician found him as well.  He also had a respiratory illness and went to the emergency room, Covid negative, potentially mildly dehydrated.  EKG was 104 with atrial fibrillation. Trop nl. Hypotension. ? If change in diltiazem Rx (Tiadyl from [place]). Pulse Ox 98. Green mucous.   Orthostatics were unremarkable in the ER.   Past Medical History:  Diagnosis Date  . Cancer [facility])    Prostate cancer -dx 5'16  . Hypercholesterolemia    no meds due to adverse reaction    Past Surgical History:  Procedure Laterality Date  . BIOPSY PROSTATE     5'16  . CERVICAL FUSION    . COLONOSCOPY W/ POLYPECTOMY     x2 benign  . IR CT HEAD LTD  05/17/2018  . IR PERCUTANEOUS ART THROMBECTOMY/INFUSION INTRACRANIAL INC DIAG ANGIO  05/17/2018  . IR US GUIDE VASC ACCESS  RIGHT  05/17/2018  . RADIOLOGY WITH ANESTHESIA N/A 05/17/2018   Procedure: IR WITH ANESTHESIA;  Surgeon: Corrie Mckusick, DO;  Location: [facility];  Service: Anesthesiology;  Laterality: N/A;  . ROBOT ASSISTED LAPAROSCOPIC RADICAL PROSTATECTOMY N/A 11/02/2014   Procedure: ROBOTIC ASSISTED LAPAROSCOPIC RADICAL PROSTATECTOMY LEVEL 1;  Surgeon: Raynelle Bring, MD;  [facility];  Service: Urology;  Laterality: N/A;  . TONSILLECTOMY      Current Medications: Current Meds  Medication Sig  . apixaban (ELIQUIS) 5 MG TABS tablet Take 1 tablet (5 mg total) by mouth 2 (two) times daily.  [place] diltiazem (CARDIZEM CD) 120 MG 24 hr capsule Take 1 capsule (120 mg total) by mouth daily.  . rosuvastatin (CRESTOR) 20 MG tablet Take 1 tablet (20 mg total) by mouth daily at 6 PM.  . [DISCONTINUED] diltiazem (CARDIZEM CD) 120 MG 24 hr capsule Take 1 capsule (120 mg total) by mouth daily.     Allergies:   Hctz [hydrochlorothiazide], Lipitor [atorvastatin calcium], and Pineapple   Social History   Socioeconomic History  . Marital status: Married    Spouse name: Not on file  . Number of children: Not on file  . Years of education: Not on file  . Highest education level: Not on file  Occupational History  . Not on file  Tobacco Use  . Smoking status: Never Smoker  . Smokeless tobacco: Never Used  Substance and Sexual Activity  . Alcohol use:  Yes    Comment: one drink daily  . Drug use: No  . Sexual activity: Not on file  Other Topics Concern  . Not on file  Social History Narrative  . Not on file   Social Determinants of Health   Financial Resource Strain:   . Difficulty of Paying Living Expenses: Not on file  Food Insecurity:   . Worried About Charity fundraiser in the Last Year: Not on file  . Ran Out of Food in the Last Year: Not on file  Transportation Needs:   . Lack of Transportation (Medical): Not on file  . Lack of Transportation (Non-Medical): Not on file  Physical Activity:   . Days of  Exercise per Week: Not on file  . Minutes of Exercise per Session: Not on file  Stress:   . Feeling of Stress : Not on file  Social Connections:   . Frequency of Communication with Friends and Family: Not on file  . Frequency of Social Gatherings with Friends and Family: Not on file  . Attends Religious Services: Not on file  . Active Member of Clubs or Organizations: Not on file  . Attends Archivist Meetings: Not on file  . Marital Status: Not on file     Family History: The patient's family history is not on file.  No early family history of CAD  ROS:   Please see the history of present illness.     All other systems reviewed and are negative.  EKGs/Labs/Other Studies Reviewed:    The following studies were reviewed today: Prior echo-normal left atrial size normal EF  EKG: Prior EKG A. fib  Recent Labs: 05/17/2018: ALT 21 04/06/2019: BUN 17; Creatinine, Ser 1.20; Hemoglobin 17.0; Platelets 228; Potassium 4.6; Sodium 138  Recent Lipid Panel    Component Value Date/Time   CHOL 217 (H) 05/18/2018 0617   TRIG 66 05/18/2018 0617   HDL 50 05/18/2018 0617   CHOLHDL 4.3 05/18/2018 0617   VLDL 13 05/18/2018 0617   LDLCALC 154 (H) 05/18/2018 0617    Physical Exam:    VS:  BP 138/90   Pulse 78   Ht 6\' 3"  (1.905 m)   Wt 212 lb (96.2 kg)   SpO2 92%   BMI 26.50 kg/m     Wt Readings from Last 3 Encounters:  04/09/19 212 lb (96.2 kg)  01/21/19 217 lb 6.4 oz (98.6 kg)  05/17/18 212 lb 1.3 oz (96.2 kg)     GEN: Well nourished, well developed, in no acute distress  HEENT: normal  Neck: no JVD, carotid bruits, or masses Cardiac: IRRR; no murmurs, rubs, or gallops,no edema  Respiratory:  clear to auscultation bilaterally, normal work of breathing GI: soft, nontender, nondistended, + BS MS: no deformity or atrophy  Skin: warm and dry, no rash Neuro:  Alert and Oriented x 3, Strength and sensation are intact Psych: euthymic mood, full affect  ASSESSMENT:     1. Persistent atrial fibrillation ([place])   2. Near syncope   3. History of stroke   4. Chronic anticoagulation    PLAN:    In order of problems listed above:  Persistent atrial fibrillation -Under reasonable rate control.  At home his pulse oximeter is showing him pulse rate between 90 and 60.  He is taking diltiazem.  As noted above, the manufacturer recently changed.  He then noticed he started to feel these near syncope episodes after taking the different manufacturer pill.  We are going  to send him on a new prescription and he will request that the drug company zydus is not used.  -I will check a Zio patch monitor 14 days to check his overall rate control and to make sure he is not having any dangerous pauses. -I will also check an echocardiogram to ensure proper structure and function of his heart to ensure that he is not having any cardiomyopathy issues with his A. fib.    Since he has been his atrial fibrillation for at least 1 year without any symptoms, we will continue with rate control.  We did discuss the possibility of rhythm control but at this point, he would like to just maintain excellent rate control.  He was playing tennis 3 days a week without any difficulty.  The only thing that recently worries [place] is the near syncopal episodes.  Chronic anticoagulation -Continue with Eliquis.  Blood work from emergency room reviewed normal.  Right MCA stroke -Eliquis, statin overall doing well with Crestor 20.  He did have some vertigo post stroke.  Sometimes it is hard to differentiate between orthostatic hypotension and vertigo.  Overall though if he was near syncopal, felt like he lost consciousness, certainly could have been a drop in blood pressure.  We will also be checking the monitor as above to make sure that he is not have any dangerous arrhythmias.  Orthostatics were negative here.   Medication Adjustments/Labs and Tests Ordered: Current medicines are reviewed at length  with the patient today.  Concerns regarding medicines are outlined above.  Orders Placed This Encounter  Procedures  . LONG TERM MONITOR (3-14 DAYS)  . ECHOCARDIOGRAM COMPLETE   Meds ordered this encounter  Medications  . diltiazem (CARDIZEM CD) 120 MG 24 hr capsule    Sig: Take 1 capsule (120 mg total) by mouth daily.    Dispense:  30 capsule    Refill:  11    Patient Instructions  Medication Instructions:  The current medical regimen is effective;  continue present plan and medications.  *If you need a refill on your cardiac medications before your next appointment, please call your pharmacy*  Testing/Procedures: Your physician has requested that you have an echocardiogram. Echocardiography is a painless test that uses sound waves to create images of your heart. It provides your doctor with information about the size and shape of your heart and how well your heart's chambers and valves are working. This procedure takes approximately one hour. There are no restrictions for this procedure.  ZIO XT- Long Term Monitor Instructions   Your physician has requested you wear your ZIO patch monitor 14 days.   This is a single patch monitor.  [employer] supplies one patch monitor per enrollment.  Additional stickers are not available.   Please do not apply patch if you will be having a Nuclear Stress Test, Echocardiogram, Cardiac CT, MRI, or Chest Xray during the time frame you would be wearing the monitor. The patch cannot be worn during these tests.  You cannot remove and re-apply the ZIO XT patch monitor.   Your ZIO patch monitor will be sent USPS Priority mail from [facility] directly to your home address. The monitor may also be mailed to a PO BOX if home delivery is not available.   It may take 3-5 days to receive your monitor after you have been enrolled.   Once you have received you monitor, please review enclosed instructions.  Your monitor has already been registered  assigning a specific monitor  serial # to you.   Applying the monitor   Shave hair from upper left chest.   Hold abrader disc by orange tab.  Rub abrader in 40 strokes over left upper chest as indicated in your monitor instructions.   Clean area with 4 enclosed alcohol pads .  Use all pads to assure are is cleaned thoroughly.  Let dry.   Apply patch as indicated in monitor instructions.  Patch will be place under collarbone on left side of chest with arrow pointing upward.   Rub patch adhesive wings for 2 minutes.Remove white label marked "1".  Remove white label marked "2".  Rub patch adhesive wings for 2 additional minutes.   While looking in a mirror, press and release button in center of patch.  A small green light will flash 3-4 times .  This will be your only indicator the monitor has been turned on.     Do not shower for the first 24 hours.  You may shower after the first 24 hours.   Press button if you feel a symptom. You will hear a small click.  Record Date, Time and Symptom in the Patient Log Book.   When you are ready to remove patch, follow instructions on last 2 pages of Patient Log Book.  Stick patch monitor onto last page of Patient Log Book.   Place Patient Log Book in [place] box.  Use locking tab on box and tape box closed securely.  The [employer] and [employer] has [employer] on it.  Please place in mailbox as soon as possible.  Your physician should have your test results approximately 7 days after the monitor has been mailed back to [facility].   Call [place] at [phone] if you have questions regarding your ZIO XT patch monitor.  Call them immediately if you see an orange light blinking on your monitor.   If your monitor falls off in less than 4 days contact our Monitor department at [phone].  If your monitor becomes loose or falls off after 4 days call [employer] at [phone] for suggestions on securing your monitor.    Follow-Up: At [facility], you and your health needs are our priority.  As part of our continuing mission to provide you with exceptional heart care, we have created designated Provider Care Teams.  These Care Teams include your primary Cardiologist (physician) and Advanced Practice Providers (APPs -  Physician Assistants and Nurse Practitioners) who all work together to provide you with the care you need, when you need it.  Your next appointment:   4 to 6 week(s)  The format for your next appointment:   In Person  Provider:   Candee Furbish, MD  Thank you for choosing [facility]!!         Signed, Candee Furbish, MD  04/09/2019 5:02 PM    [place]

## 2019-04-09 NOTE — Patient Instructions (Addendum)
Medication Instructions:  The current medical regimen is effective;  continue present plan and medications.  *If you need a refill on your cardiac medications before your next appointment, please call your pharmacy*  Testing/Procedures: Your physician has requested that you have an echocardiogram. Echocardiography is a painless test that uses sound waves to create images of your heart. It provides your doctor with information about the size and shape of your heart and how well your heart's chambers and valves are working. This procedure takes approximately one hour. There are no restrictions for this procedure.  ZIO XT- Long Term Monitor Instructions   Your physician has requested you wear your ZIO patch monitor 14 days.   This is a single patch monitor.  Ir[employer] supplies one patch monitor per enrollment.  Additional stickers are not available.   Please do not apply patch if you will be having a Nuclear Stress Test, Echocardiogram, Cardiac CT, MRI, or Chest Xray during the time frame you would be wearing the monitor. The patch cannot be worn during these tests.  You cannot remove and re-apply the ZIO XT patch monitor.   Your ZIO patch monitor will be sent USPS Priority mail from [facility] directly to your home address. The monitor may also be mailed to a PO BOX if home delivery is not available.   It may take 3-5 days to receive your monitor after you have been enrolled.   Once you have received you monitor, please review enclosed instructions.  Your monitor has already been registered assigning a specific monitor serial # to you.   Applying the monitor   Shave hair from upper left chest.   Hold abrader disc by orange tab.  Rub abrader in 40 strokes over left upper chest as indicated in your monitor instructions.   Clean area with 4 enclosed alcohol pads .  Use all pads to assure are is cleaned thoroughly.  Let dry.   Apply patch as indicated in monitor instructions.  Patch  will be place under collarbone on left side of chest with arrow pointing upward.   Rub patch adhesive wings for 2 minutes.Remove white label marked "1".  Remove white label marked "2".  Rub patch adhesive wings for 2 additional minutes.   While looking in a mirror, press and release button in center of patch.  A small green light will flash 3-4 times .  This will be your only indicator the monitor has been turned on.     Do not shower for the first 24 hours.  You may shower after the first 24 hours.   Press button if you feel a symptom. You will hear a small click.  Record Date, Time and Symptom in the Patient Log Book.   When you are ready to remove patch, follow instructions on last 2 pages of Patient Log Book.  Stick patch monitor onto last page of Patient Log Book.   Place Patient Log Book in [place] box.  Use locking tab on box and tape box closed securely.  The [employer] and [employer] has [employer] on it.  Please place in mailbox as soon as possible.  Your physician should have your test results approximately 7 days after the monitor has been mailed back to [facility].   Call [facility] at [phone] if you have questions regarding your ZIO XT patch monitor.  Call them immediately if you see an orange light blinking on your monitor.   If your monitor falls off in  less than 4 days contact our Monitor department at [phone].  If your monitor becomes loose or falls off after 4 days call [employer] at [phone] for suggestions on securing your monitor.   Follow-Up: At [facility], you and your health needs are our priority.  As part of our continuing mission to provide you with exceptional heart care, we have created designated Provider Care Teams.  These Care Teams include your primary Cardiologist (physician) and Advanced Practice Providers (APPs -  Physician Assistants and Nurse Practitioners) who all work together to provide you with the care you need, when  you need it.  Your next appointment:   4 to 6 week(s)  The format for your next appointment:   In Person  Provider:   Candee Furbish, MD  Thank you for choosing [facility]!!

## 2019-04-10 ENCOUNTER — Other Ambulatory Visit (INDEPENDENT_AMBULATORY_CARE_PROVIDER_SITE_OTHER): Payer: Medicare Other

## 2019-04-10 DIAGNOSIS — I4819 Other persistent atrial fibrillation: Secondary | ICD-10-CM | POA: Diagnosis not present

## 2019-04-10 DIAGNOSIS — R55 Syncope and collapse: Secondary | ICD-10-CM | POA: Diagnosis not present

## 2019-04-11 DIAGNOSIS — I4891 Unspecified atrial fibrillation: Secondary | ICD-10-CM | POA: Diagnosis not present

## 2019-04-11 DIAGNOSIS — E7800 Pure hypercholesterolemia, unspecified: Secondary | ICD-10-CM | POA: Diagnosis not present

## 2019-04-11 DIAGNOSIS — Z0000 Encounter for general adult medical examination without abnormal findings: Secondary | ICD-10-CM | POA: Diagnosis not present

## 2019-04-11 DIAGNOSIS — I10 Essential (primary) hypertension: Secondary | ICD-10-CM | POA: Diagnosis not present

## 2019-04-11 DIAGNOSIS — M5030 Other cervical disc degeneration, unspecified cervical region: Secondary | ICD-10-CM | POA: Diagnosis not present

## 2019-04-11 DIAGNOSIS — Z800 Family history of malignant neoplasm of digestive organs: Secondary | ICD-10-CM | POA: Diagnosis not present

## 2019-04-11 DIAGNOSIS — R7303 Prediabetes: Secondary | ICD-10-CM | POA: Diagnosis not present

## 2019-04-11 DIAGNOSIS — Z1389 Encounter for screening for other disorder: Secondary | ICD-10-CM | POA: Diagnosis not present

## 2019-04-11 DIAGNOSIS — C61 Malignant neoplasm of prostate: Secondary | ICD-10-CM | POA: Diagnosis not present

## 2019-04-11 DIAGNOSIS — N529 Male erectile dysfunction, unspecified: Secondary | ICD-10-CM | POA: Diagnosis not present

## 2019-04-11 DIAGNOSIS — I639 Cerebral infarction, unspecified: Secondary | ICD-10-CM | POA: Diagnosis not present

## 2019-04-17 ENCOUNTER — Ambulatory Visit: Payer: Medicare Other | Attending: Internal Medicine

## 2019-04-17 DIAGNOSIS — Z23 Encounter for immunization: Secondary | ICD-10-CM

## 2019-04-17 NOTE — Progress Notes (Signed)
   Covid-19 Vaccination Clinic  Name:  Patrick Salinas    MRN: [id number] DOB: [birth year]  04/17/2019  Patrick Salinas was observed post Covid-19 immunization for 15 minutes without incidence. He was provided with Vaccine Information Sheet and instruction to access the V-Safe system.   Patrick Salinas was instructed to call 911 with any severe reactions post vaccine: Marland Kitchen Difficulty breathing  . Swelling of your face and throat  . A fast heartbeat  . A bad rash all over your body  . Dizziness and weakness    Immunizations Administered    Name Date Dose VIS Date Route   Pfizer COVID-19 Vaccine 04/17/2019  3:58 PM 0.3 mL 03/07/2019 Intramuscular   Manufacturer: [place]   Lot: [id number]   [place]: [id number]

## 2019-04-18 ENCOUNTER — Other Ambulatory Visit: Payer: Self-pay

## 2019-04-18 ENCOUNTER — Ambulatory Visit (HOSPITAL_COMMUNITY): Payer: Medicare Other | Attending: Cardiology

## 2019-04-18 DIAGNOSIS — R55 Syncope and collapse: Secondary | ICD-10-CM | POA: Diagnosis not present

## 2019-04-18 DIAGNOSIS — I4819 Other persistent atrial fibrillation: Secondary | ICD-10-CM | POA: Insufficient documentation

## 2019-05-02 DIAGNOSIS — I4819 Other persistent atrial fibrillation: Secondary | ICD-10-CM | POA: Diagnosis not present

## 2019-05-02 DIAGNOSIS — R55 Syncope and collapse: Secondary | ICD-10-CM | POA: Diagnosis not present

## 2019-05-08 ENCOUNTER — Ambulatory Visit: Payer: Medicare Other | Attending: Internal Medicine

## 2019-05-08 DIAGNOSIS — Z23 Encounter for immunization: Secondary | ICD-10-CM | POA: Insufficient documentation

## 2019-05-08 NOTE — Progress Notes (Signed)
   Covid-19 Vaccination Clinic  Name:  Patrick Salinas    MRN: [id number] DOB: Sep 16, 1950  05/08/2019  [name] was observed post Covid-19 immunization for 15 minutes without incidence. He was provided with Vaccine Information Sheet and instruction to access the V-Safe system.   [name] was instructed to call 911 with any severe reactions post vaccine: Marland Kitchen Difficulty breathing  . Swelling of your face and throat  . A fast heartbeat  . A bad rash all over your body  . Dizziness and weakness    Immunizations Administered    Name Date Dose VIS Date Route   Pfizer COVID-19 Vaccine 05/08/2019  4:55 PM 0.3 mL 03/07/2019 Intramuscular   Manufacturer: [place]   Lot: [id number]   [place]: [id number]

## 2019-05-09 DIAGNOSIS — I4891 Unspecified atrial fibrillation: Secondary | ICD-10-CM | POA: Diagnosis not present

## 2019-05-09 DIAGNOSIS — R5381 Other malaise: Secondary | ICD-10-CM | POA: Diagnosis not present

## 2019-05-09 DIAGNOSIS — R110 Nausea: Secondary | ICD-10-CM | POA: Diagnosis not present

## 2019-05-09 DIAGNOSIS — R61 Generalized hyperhidrosis: Secondary | ICD-10-CM | POA: Diagnosis not present

## 2019-05-16 ENCOUNTER — Ambulatory Visit (INDEPENDENT_AMBULATORY_CARE_PROVIDER_SITE_OTHER): Payer: Medicare Other | Admitting: Cardiology

## 2019-05-16 ENCOUNTER — Encounter: Payer: Self-pay | Admitting: Cardiology

## 2019-05-16 ENCOUNTER — Other Ambulatory Visit: Payer: Self-pay

## 2019-05-16 VITALS — BP 140/80 | HR 89 | Ht 75.0 in | Wt 207.0 lb

## 2019-05-16 DIAGNOSIS — Z8673 Personal history of transient ischemic attack (TIA), and cerebral infarction without residual deficits: Secondary | ICD-10-CM

## 2019-05-16 DIAGNOSIS — I4819 Other persistent atrial fibrillation: Secondary | ICD-10-CM | POA: Diagnosis not present

## 2019-05-16 DIAGNOSIS — Z7901 Long term (current) use of anticoagulants: Secondary | ICD-10-CM | POA: Diagnosis not present

## 2019-05-16 NOTE — Patient Instructions (Signed)
Medication Instructions:  The current medical regimen is effective;  continue present plan and medications.  *If you need a refill on your cardiac medications before your next appointment, please call your pharmacy*  Follow-Up: At [facility], you and your health needs are our priority.  As part of our continuing mission to provide you with exceptional heart care, we have created designated Provider Care Teams.  These Care Teams include your primary Cardiologist (physician) and Advanced Practice Providers (APPs -  Physician Assistants and Nurse Practitioners) who all work together to provide you with the care you need, when you need it.  Your next appointment:   6 month(s)  The format for your next appointment:   In Person  Provider:   Mark Skains, MD  Thank you for choosing [facility]!!     

## 2019-05-16 NOTE — Progress Notes (Signed)
Cardiology Office Note:    Date:  05/16/2019   ID:  Patrick Salinas, DOB January 25, 1951, MRN [id number]  PCP:  [name], MD  Cardiologist:  Candee Furbish, MD  Electrophysiologist:  None   Referring MD: [name], MD     History of Present Illness:    Patrick Salinas is a 69 y.o. male with persistent atrial fibrillation, relatively asymptomatic with recent cardiac monitor 05/02/2019:  Atrial fibrillation average heart rate 86 bpm, minimum 52 bpm, maximum 197 bpm  No pauses  Overall well controlled atrial fibrillation  Prior to our last visit, he did have a presyncopal episode.  Possible orthostasis, dehydration.  May have had some rapid heart rate surrounding it as well..  Normal EF.  Blood pressure has been fairly soft.  Right MCA stroke in 2020.  After second Covid vaccine, he did feel shaky after few hours, diaphoretic.  Felt poor.  Hydrated.  His son who works for EMS came out checked up on him.  Has had some orthostatic type issues since his stroke.  Now has a same song watch and phone with a EKG app.  Past Medical History:  Diagnosis Date  . Cancer [facility])    Prostate cancer -dx 5'16  . Hypercholesterolemia    no meds due to adverse reaction    Past Surgical History:  Procedure Laterality Date  . BIOPSY PROSTATE     5'16  . CERVICAL FUSION    . COLONOSCOPY W/ POLYPECTOMY     x2 benign  . IR CT HEAD LTD  05/17/2018  . IR PERCUTANEOUS ART THROMBECTOMY/INFUSION INTRACRANIAL INC DIAG ANGIO  05/17/2018  . IR US GUIDE VASC ACCESS RIGHT  05/17/2018  . RADIOLOGY WITH ANESTHESIA N/A 05/17/2018   Procedure: IR WITH ANESTHESIA;  Surgeon: Corrie Mckusick, DO;  Location: [place];  Service: Anesthesiology;  Laterality: N/A;  . ROBOT ASSISTED LAPAROSCOPIC RADICAL PROSTATECTOMY N/A 11/02/2014   Procedure: ROBOTIC ASSISTED LAPAROSCOPIC RADICAL PROSTATECTOMY LEVEL 1;  Surgeon: Raynelle Bring, MD;  Location: [facility];  Service: Urology;  Laterality: N/A;  . TONSILLECTOMY      Current  Medications: Current Meds  Medication Sig  . apixaban (ELIQUIS) 5 MG TABS tablet Take 1 tablet (5 mg total) by mouth 2 (two) times daily.  Marland Kitchen diltiazem (CARDIZEM CD) 120 MG 24 hr capsule Take 1 capsule (120 mg total) by mouth daily.  . rosuvastatin (CRESTOR) 20 MG tablet Take 1 tablet (20 mg total) by mouth daily at 6 PM.     Allergies:   Hctz [hydrochlorothiazide], Lipitor [atorvastatin calcium], and Pineapple   Social History   Socioeconomic History  . Marital status: Married    Spouse name: Not on file  . Number of children: Not on file  . Years of education: Not on file  . Highest education level: Not on file  Occupational History  . Not on file  Tobacco Use  . Smoking status: Never Smoker  . Smokeless tobacco: Never Used  Substance and Sexual Activity  . Alcohol use: Yes    Comment: one drink daily  . Drug use: No  . Sexual activity: Not on file  Other Topics Concern  . Not on file  Social History Narrative  . Not on file   Social Determinants of Health   Financial Resource Strain:   . Difficulty of Paying Living Expenses: Not on file  Food Insecurity:   . Worried About Charity fundraiser in the Last Year: Not on file  . Ran Out  of Food in the Last Year: Not on file  Transportation Needs:   . Lack of Transportation (Medical): Not on file  . Lack of Transportation (Non-Medical): Not on file  Physical Activity:   . Days of Exercise per Week: Not on file  . Minutes of Exercise per Session: Not on file  Stress:   . Feeling of Stress : Not on file  Social Connections:   . Frequency of Communication with Friends and Family: Not on file  . Frequency of Social Gatherings with Friends and Family: Not on file  . Attends Religious Services: Not on file  . Active Member of Clubs or Organizations: Not on file  . Attends Archivist Meetings: Not on file  . Marital Status: Not on file     Family History: The patient'sNo early family history of coronary artery  disease  family history is not on file.  ROS:   Please see the history of present illness.     All other systems reviewed and are negative.  EKGs/Labs/Other Studies Reviewed:    The following studies were reviewed today: Event monitor as above.  Echo reviewed normal EF mild left atrial dilatation  EKG:  EKG is not ordered today.   Recent Labs: 05/17/2018: ALT 21 04/06/2019: BUN 17; Creatinine, Ser 1.20; Hemoglobin 17.0; Platelets 228; Potassium 4.6; Sodium 138  Recent Lipid Panel    Component Value Date/Time   CHOL 217 (H) 05/18/2018 0617   TRIG 66 05/18/2018 0617   HDL 50 05/18/2018 0617   CHOLHDL 4.3 05/18/2018 0617   VLDL 13 05/18/2018 0617   LDLCALC 154 (H) 05/18/2018 0617    Physical Exam:    VS:  BP 140/80   Pulse 89   Ht 6\' 3"  (1.905 m)   Wt 207 lb (93.9 kg)   BMI 25.87 kg/m     Wt Readings from Last 3 Encounters:  05/16/19 207 lb (93.9 kg)  04/09/19 212 lb (96.2 kg)  01/21/19 217 lb 6.4 oz (98.6 kg)     GEN:  Well nourished, well developed in no acute distress HEENT: Normal NECK: No JVD; No carotid bruits LYMPHATICS: No lymphadenopathy CARDIAC: RRR, no murmurs, rubs, gallops RESPIRATORY:  Clear to auscultation without rales, wheezing or rhonchi  ABDOMEN: Soft, non-tender, non-distended MUSCULOSKELETAL:  No edema; No deformity  SKIN: Warm and dry NEUROLOGIC:  Alert and oriented x 3 PSYCHIATRIC:  Normal affect   ASSESSMENT:    1. Persistent atrial fibrillation ([place])   2. Chronic anticoagulation   3. History of stroke    PLAN:    In order of problems listed above:  Persistent atrial fibrillation -Once again we discussed the possibility of cardioversion.  We mutually decided to continue with rate control.  He is still playing tennis, active, continue with chronic anticoagulation.  Well rate controlled on monitor.  Orthostasis -Continue to hydrate well.  Stroke could have played a role in this.  Chronic anticoagulation -Continue with Eliquis.   No bleeding.  Hemoglobin and creatinine normal.  History of stroke-continue Eliquis  Hyperlipidemia -Crestor 20 mg high intensity.  Excellent.  No myalgias.  We will continue.   Medication Adjustments/Labs and Tests Ordered: Current medicines are reviewed at length with the patient today.  Concerns regarding medicines are outlined above.  No orders of the defined types were placed in this encounter.  No orders of the defined types were placed in this encounter.   Patient Instructions  Medication Instructions:  The current medical regimen is effective;  continue present plan and medications.  *If you need a refill on your cardiac medications before your next appointment, please call your pharmacy*  Follow-Up: At [facility], you and your health needs are our priority.  As part of our continuing mission to provide you with exceptional heart care, we have created designated Provider Care Teams.  These Care Teams include your primary Cardiologist (physician) and Advanced Practice Providers (APPs -  Physician Assistants and Nurse Practitioners) who all work together to provide you with the care you need, when you need it.  Your next appointment:   6 month(s)  The format for your next appointment:   In Person  Provider:   Candee Furbish, MD  Thank you for choosing [facility]!!        Signed, Candee Furbish, MD  05/16/2019 1:44 PM    [place]

## 2019-10-10 DIAGNOSIS — C61 Malignant neoplasm of prostate: Secondary | ICD-10-CM | POA: Diagnosis not present

## 2019-10-17 DIAGNOSIS — N5201 Erectile dysfunction due to arterial insufficiency: Secondary | ICD-10-CM | POA: Diagnosis not present

## 2019-10-17 DIAGNOSIS — C61 Malignant neoplasm of prostate: Secondary | ICD-10-CM | POA: Diagnosis not present

## 2019-12-23 ENCOUNTER — Ambulatory Visit: Payer: No Typology Code available for payment source | Attending: Internal Medicine

## 2019-12-23 DIAGNOSIS — Z23 Encounter for immunization: Secondary | ICD-10-CM

## 2019-12-23 NOTE — Progress Notes (Signed)
   Covid-19 Vaccination Clinic  Name:  ROCZEN WAYMIRE    MRN: [id number] DOB: 03/10/1951  12/23/2019  Mr. Colgan was observed post Covid-19 immunization for 15 minutes without incident. He was provided with Vaccine Information Sheet and instruction to access the V-Safe system.   Mr. Prevette was instructed to call 911 with any severe reactions post vaccine: Marland Kitchen Difficulty breathing  . Swelling of face and throat  . A fast heartbeat  . A bad rash all over body  . Dizziness and weakness

## 2020-02-06 ENCOUNTER — Encounter: Payer: Self-pay | Admitting: Cardiology

## 2020-02-06 ENCOUNTER — Other Ambulatory Visit: Payer: Self-pay

## 2020-02-06 ENCOUNTER — Ambulatory Visit (INDEPENDENT_AMBULATORY_CARE_PROVIDER_SITE_OTHER): Payer: Medicare Other | Admitting: Cardiology

## 2020-02-06 VITALS — BP 140/80 | HR 76 | Ht 75.0 in | Wt 211.0 lb

## 2020-02-06 DIAGNOSIS — I63411 Cerebral infarction due to embolism of right middle cerebral artery: Secondary | ICD-10-CM

## 2020-02-06 DIAGNOSIS — I4819 Other persistent atrial fibrillation: Secondary | ICD-10-CM

## 2020-02-06 DIAGNOSIS — I10 Essential (primary) hypertension: Secondary | ICD-10-CM

## 2020-02-06 DIAGNOSIS — E785 Hyperlipidemia, unspecified: Secondary | ICD-10-CM | POA: Diagnosis not present

## 2020-02-06 NOTE — Patient Instructions (Signed)

## 2020-02-06 NOTE — Progress Notes (Signed)
Cardiology Office Note:    Date:  02/06/2020   ID:  Patrick Salinas, DOB 06-Apr-1950, MRN [id number]  PCP:  [name], MD  [facility] Cardiologist:  Candee Furbish, MD  [facility] Electrophysiologist:  None   Referring MD: [name], MD     History of Present Illness:    Patrick Salinas is a 69 y.o. male persistent atrial fibrillation here for follow-up.  Cardiac event monitor 05/02/2019:  Atrial fibrillation average heart rate 86 bpm, minimum 52 bpm, maximum 197 bpm  No pauses  Overall well controlled atrial fibrillation  Right MCA stroke 2020. TPA  Blood pressure fairly soft at times.  Currently doing quite well with this.  Has had some presyncopal episodes at times possible orthostatics or dehydration.  Normal EF.  After second Covid vaccine he felt diaphoretic for a few hours dehydrated.  His son who works for EMS checked him out.  Has had some orthostatic issues as described above following his stroke.  Overall recently has been doing very well.  Has been able to play tennis for several hours duration without any difficulty.  No significant shortness of breath chest pain no syncope no strokelike symptoms.  Past Medical History:  Diagnosis Date   Cancer [facility])    Prostate cancer -dx 5'16   Hypercholesterolemia    no meds due to adverse reaction    Past Surgical History:  Procedure Laterality Date   BIOPSY PROSTATE     5'16   CERVICAL FUSION     COLONOSCOPY W/ POLYPECTOMY     x2 benign   IR CT HEAD LTD  05/17/2018   IR PERCUTANEOUS ART THROMBECTOMY/INFUSION INTRACRANIAL INC DIAG ANGIO  05/17/2018   IR US GUIDE VASC ACCESS RIGHT  05/17/2018   RADIOLOGY WITH ANESTHESIA N/A 05/17/2018   Procedure: IR WITH ANESTHESIA;  Surgeon: Corrie Mckusick, DO;  Location: [place];  Service: Anesthesiology;  Laterality: N/A;   ROBOT ASSISTED LAPAROSCOPIC RADICAL PROSTATECTOMY N/A 11/02/2014   Procedure: ROBOTIC ASSISTED LAPAROSCOPIC RADICAL PROSTATECTOMY LEVEL 1;   Surgeon: Raynelle Bring, MD;  [facility];  Service: Urology;  Laterality: N/A;   TONSILLECTOMY      Current Medications: Current Meds  Medication Sig   apixaban (ELIQUIS) 5 MG TABS tablet Take 1 tablet (5 mg total) by mouth 2 (two) times daily.   diltiazem (CARDIZEM CD) 120 MG 24 hr capsule Take 1 capsule (120 mg total) by mouth daily.   rosuvastatin (CRESTOR) 20 MG tablet Take 1 tablet (20 mg total) by mouth daily at 6 PM.     Allergies:   Hctz [hydrochlorothiazide], Lipitor [atorvastatin calcium], and Pineapple   Social History   Socioeconomic History   Marital status: Married    Spouse name: Not on file   Number of children: Not on file   Years of education: Not on file   Highest education level: Not on file  Occupational History   Not on file  Tobacco Use   Smoking status: Never Smoker   Smokeless tobacco: Never Used  Vaping Use   Vaping Use: Never used  Substance and Sexual Activity   Alcohol use: Yes    Comment: one drink daily   Drug use: No   Sexual activity: Not on file  Other Topics Concern   Not on file  Social History Narrative   Not on file   Social Determinants of Health   Financial Resource Strain:    Difficulty of Paying Living Expenses: Not on file  Food Insecurity:  Worried About Charity fundraiser in the Last Year: Not on file   [employer] of Food in the Last Year: Not on file  Transportation Needs:    Lack of Transportation (Medical): Not on file   Lack of Transportation (Non-Medical): Not on file  Physical Activity:    Days of Exercise per Week: Not on file   Minutes of Exercise per Session: Not on file  Stress:    Feeling of Stress : Not on file  Social Connections:    Frequency of Communication with Friends and Family: Not on file   Frequency of Social Gatherings with Friends and Family: Not on file   Attends Religious Services: Not on file   Active Member of Clubs or Organizations: Not on file    Attends Archivist Meetings: Not on file   Marital Status: Not on file     Family History:  ROS:   Please see the history of present illness.     All other systems reviewed and are negative.  EKGs/Labs/Other Studies Reviewed:    The following studies were reviewed today:  ECHO 04/08/19: 1. Left ventricular ejection fraction, by visual estimation, is 55 to  60%. The left ventricle has normal function. There is no left ventricular  hypertrophy.  2. Left ventricular diastolic function could not be evaluated.  3. The left ventricle has no regional wall motion abnormalities.  4. Global right ventricle has normal systolic function.The right  ventricular size is normal.  5. Left atrial size was mildly dilated.  6. Right atrial size was mildly dilated.  7. The mitral valve is normal in structure. Mild mitral valve  regurgitation. No evidence of mitral stenosis.  8. The tricuspid valve is normal in structure. Tricuspid valve  regurgitation is mild.  9. The aortic valve is tricuspid. Aortic valve regurgitation is not  visualized. Mild aortic valve sclerosis without stenosis.  10. The pulmonic valve was normal in structure. Pulmonic valve  regurgitation is not visualized.  11. Normal pulmonary artery systolic pressure.  12. The inferior vena cava is dilated in size with >50% respiratory  variability, suggesting right atrial pressure of 8 mmHg.  13. Normal LV function; mild biatrial enlargement; mild MR and TR.   EKG:  EKG is  ordered today.  The ekg ordered today demonstrates   Recent Labs: 04/06/2019: BUN 17; Creatinine, Ser 1.20; Hemoglobin 17.0; Platelets 228; Potassium 4.6; Sodium 138  Recent Lipid Panel    Component Value Date/Time   CHOL 217 (H) 05/18/2018 0617   TRIG 66 05/18/2018 0617   HDL 50 05/18/2018 0617   CHOLHDL 4.3 05/18/2018 0617   VLDL 13 05/18/2018 0617   LDLCALC 154 (H) 05/18/2018 0617     Risk Assessment/Calculations:     CHA2DS2-VASc  Score = 3  This indicates a 3.2% annual risk of stroke. The patient's score is based upon: CHF History: 0 HTN History: 0 Diabetes History: 0 Stroke History: 2 Vascular Disease History: 0 Age Score: 1 Gender Score: 0      Physical Exam:    VS:  BP 140/80    Pulse 76    Ht 6\' 3"  (1.905 m)    Wt 211 lb (95.7 kg)    SpO2 96%    BMI 26.37 kg/m     Wt Readings from Last 3 Encounters:  02/06/20 211 lb (95.7 kg)  05/16/19 207 lb (93.9 kg)  04/09/19 212 lb (96.2 kg)     GEN:  Well nourished, well developed  in no acute distress HEENT: Normal NECK: No JVD; No carotid bruits LYMPHATICS: No lymphadenopathy CARDIAC: irreg, no murmurs, rubs, gallops RESPIRATORY:  Clear to auscultation without rales, wheezing or rhonchi  ABDOMEN: Soft, non-tender, non-distended MUSCULOSKELETAL:  No edema; No deformity  SKIN: Warm and dry NEUROLOGIC:  Alert and oriented x 3 PSYCHIATRIC:  Normal affect   ASSESSMENT:    1. Persistent atrial fibrillation ([place])   2. Hyperlipidemia LDL goal <70   3. Essential hypertension   4. Embolic stroke involving right middle cerebral artery (HCC) s/p tPA and mechanical thrombectomy, d/t AF    PLAN:    In order of problems listed above:  Longstanding persistent atrial fibrillation -No changes made.  Good rate control.  Diltiazem CD 120. -Staying hydrated.  Playing tennis  Chronic anticoagulation -Continue with Eliquis 5 mg twice a day.  Last hemoglobin 17.  Last creatinine 1.2.  Prior MCA stroke -Continue with Eliquis.  Embolic source from atrial fibrillation.   Shared Decision Making/Informed Consent      Medication Adjustments/Labs and Tests Ordered: Current medicines are reviewed at length with the patient today.  Concerns regarding medicines are outlined above.  Orders Placed This Encounter  Procedures   EKG 12-Lead   No orders of the defined types were placed in this encounter.   Patient Instructions  Medication Instructions:  The  current medical regimen is effective;  continue present plan and medications.  *If you need a refill on your cardiac medications before your next appointment, please call your pharmacy*  Follow-Up: At [facility], you and your health needs are our priority.  As part of our continuing mission to provide you with exceptional heart care, we have created designated Provider Care Teams.  These Care Teams include your primary Cardiologist (physician) and Advanced Practice Providers (APPs -  Physician Assistants and Nurse Practitioners) who all work together to provide you with the care you need, when you need it.  We recommend signing up for the patient portal called "MyChart".  Sign up information is provided on this After Visit Summary.  MyChart is used to connect with patients for Virtual Visits (Telemedicine).  Patients are able to view lab/test results, encounter notes, upcoming appointments, etc.  Non-urgent messages can be sent to your provider as well.   To learn more about what you can do with MyChart, go to [email or web address].    Your next appointment:   6 month(s)  The format for your next appointment:   In Person  Provider:   Candee Furbish, MD  Thank you for choosing [facility]!!        Signed, Candee Furbish, MD  02/06/2020 10:50 AM    [place]

## 2020-02-14 DIAGNOSIS — Z23 Encounter for immunization: Secondary | ICD-10-CM | POA: Diagnosis not present

## 2020-03-10 ENCOUNTER — Other Ambulatory Visit: Payer: Self-pay | Admitting: Cardiology

## 2020-04-14 DIAGNOSIS — I10 Essential (primary) hypertension: Secondary | ICD-10-CM | POA: Diagnosis not present

## 2020-04-14 DIAGNOSIS — E7800 Pure hypercholesterolemia, unspecified: Secondary | ICD-10-CM | POA: Diagnosis not present

## 2020-04-14 DIAGNOSIS — Z23 Encounter for immunization: Secondary | ICD-10-CM | POA: Diagnosis not present

## 2020-04-14 DIAGNOSIS — I4891 Unspecified atrial fibrillation: Secondary | ICD-10-CM | POA: Diagnosis not present

## 2020-04-14 DIAGNOSIS — N529 Male erectile dysfunction, unspecified: Secondary | ICD-10-CM | POA: Diagnosis not present

## 2020-04-14 DIAGNOSIS — Z8546 Personal history of malignant neoplasm of prostate: Secondary | ICD-10-CM | POA: Diagnosis not present

## 2020-04-14 DIAGNOSIS — Z800 Family history of malignant neoplasm of digestive organs: Secondary | ICD-10-CM | POA: Diagnosis not present

## 2020-04-14 DIAGNOSIS — R7303 Prediabetes: Secondary | ICD-10-CM | POA: Diagnosis not present

## 2020-04-14 DIAGNOSIS — Z0000 Encounter for general adult medical examination without abnormal findings: Secondary | ICD-10-CM | POA: Diagnosis not present

## 2020-04-14 DIAGNOSIS — Z1389 Encounter for screening for other disorder: Secondary | ICD-10-CM | POA: Diagnosis not present

## 2020-04-14 DIAGNOSIS — Z8673 Personal history of transient ischemic attack (TIA), and cerebral infarction without residual deficits: Secondary | ICD-10-CM | POA: Diagnosis not present

## 2020-08-16 DIAGNOSIS — Z23 Encounter for immunization: Secondary | ICD-10-CM | POA: Diagnosis not present

## 2020-10-03 DIAGNOSIS — Z20822 Contact with and (suspected) exposure to covid-19: Secondary | ICD-10-CM | POA: Diagnosis not present

## 2020-10-12 DIAGNOSIS — E7800 Pure hypercholesterolemia, unspecified: Secondary | ICD-10-CM | POA: Diagnosis not present

## 2020-10-12 DIAGNOSIS — I10 Essential (primary) hypertension: Secondary | ICD-10-CM | POA: Diagnosis not present

## 2020-10-12 DIAGNOSIS — R7303 Prediabetes: Secondary | ICD-10-CM | POA: Diagnosis not present

## 2020-10-12 DIAGNOSIS — I4891 Unspecified atrial fibrillation: Secondary | ICD-10-CM | POA: Diagnosis not present

## 2020-11-01 IMAGING — DX DG CHEST 1V PORT
1 series · 1 of 1 positions shown · non-contrast
Comparison: None.

CLINICAL DATA: Shortness of breath.

EXAM:
PORTABLE CHEST 1 VIEW

[chest ap]
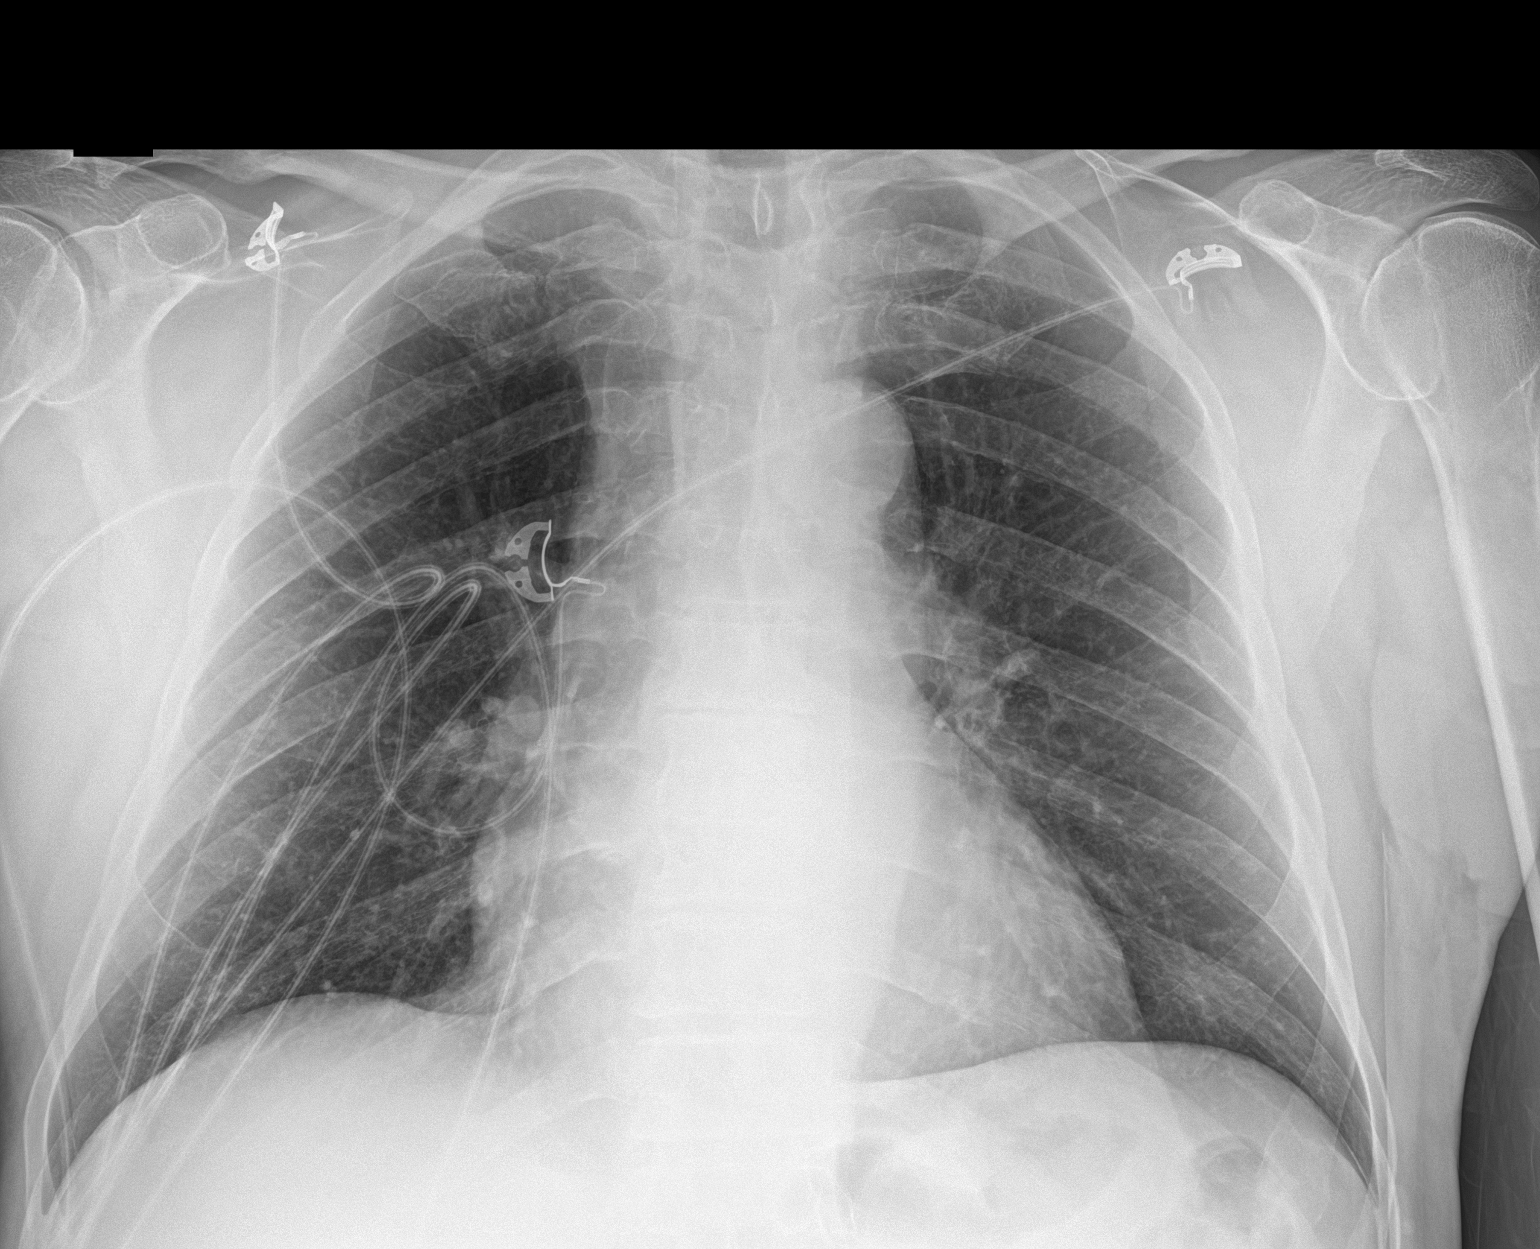

[1 of 1 positions shown; findings below may reference images not displayed]

FINDINGS: Appears to be mild cardiomegaly. However, heart size is poorly
assessed due to portable technique. The hila and mediastinum are
normal. No pneumothorax. The lungs are clear.
IMPRESSION: No active disease.

## 2020-11-05 DIAGNOSIS — R509 Fever, unspecified: Secondary | ICD-10-CM | POA: Diagnosis not present

## 2020-11-05 DIAGNOSIS — J029 Acute pharyngitis, unspecified: Secondary | ICD-10-CM | POA: Diagnosis not present

## 2020-11-05 DIAGNOSIS — B349 Viral infection, unspecified: Secondary | ICD-10-CM | POA: Diagnosis not present

## 2020-11-05 DIAGNOSIS — Z03818 Encounter for observation for suspected exposure to other biological agents ruled out: Secondary | ICD-10-CM | POA: Diagnosis not present

## 2020-12-09 ENCOUNTER — Encounter (HOSPITAL_COMMUNITY): Payer: Self-pay | Admitting: Physician Assistant

## 2020-12-09 ENCOUNTER — Ambulatory Visit (HOSPITAL_COMMUNITY)
Admission: RE | Admit: 2020-12-09 | Discharge: 2020-12-09 | Disposition: A | Discharge status: Home / Self Care | Payer: Medicare Other | Source: Ambulatory Visit | Attending: Physician Assistant | Admitting: Physician Assistant

## 2020-12-09 ENCOUNTER — Other Ambulatory Visit: Payer: Self-pay

## 2020-12-09 VITALS — BP 132/92 | HR 109 | Ht 75.0 in | Wt 203.6 lb

## 2020-12-09 DIAGNOSIS — Z7901 Long term (current) use of anticoagulants: Secondary | ICD-10-CM | POA: Insufficient documentation

## 2020-12-09 DIAGNOSIS — D6869 Other thrombophilia: Secondary | ICD-10-CM | POA: Diagnosis not present

## 2020-12-09 DIAGNOSIS — I4821 Permanent atrial fibrillation: Secondary | ICD-10-CM | POA: Diagnosis not present

## 2020-12-09 DIAGNOSIS — Z79899 Other long term (current) drug therapy: Secondary | ICD-10-CM | POA: Insufficient documentation

## 2020-12-09 DIAGNOSIS — Z9079 Acquired absence of other genital organ(s): Secondary | ICD-10-CM | POA: Insufficient documentation

## 2020-12-09 DIAGNOSIS — T63441A Toxic effect of venom of bees, accidental (unintentional), initial encounter: Secondary | ICD-10-CM | POA: Diagnosis not present

## 2020-12-09 DIAGNOSIS — I10 Essential (primary) hypertension: Secondary | ICD-10-CM | POA: Diagnosis not present

## 2020-12-09 LAB — BASIC METABOLIC PANEL
Anion gap: 9 (ref 5–15)
BUN: 12 mg/dL (ref 8–23)
CO2: 25 mmol/L (ref 22–32)
Calcium: 9 mg/dL (ref 8.9–10.3)
Chloride: 100 mmol/L (ref 98–111)
Creatinine, Ser: 1.05 mg/dL (ref 0.61–1.24)
GFR, Estimated: >60 mL/min (ref >60)
Glucose, Bld: 118 mg/dL — ABNORMAL HIGH (ref 70–99)
Potassium: 4.7 mmol/L (ref 3.5–5.1)
Sodium: 134 mmol/L — ABNORMAL LOW (ref 135–145)

## 2020-12-09 LAB — CBC
HCT: 49.7 % (ref 39.0–52.0)
Hemoglobin: 17 g/dL (ref 13.0–17.0)
MCH: 31.4 pg (ref 26.0–34.0)
MCHC: 34.2 g/dL (ref 30.0–36.0)
MCV: 91.7 fL (ref 80.0–100.0)
Platelets: 203 10*3/uL (ref 150–400)
RBC: 5.42 MIL/uL (ref 4.22–5.81)
RDW: 12.9 % (ref 11.5–15.5)
WBC: 7.8 10*3/uL (ref 4.0–10.5)
nRBC: 0 % (ref 0.0–0.2)

## 2020-12-09 MED ORDER — DILTIAZEM HCL ER COATED BEADS 120 MG PO CP24: ORAL_CAPSULE | ORAL | 3 refills | Status: DC

## 2020-12-09 MED ORDER — PREDNISONE 5 MG (21) PO TBPK: ORAL_TABLET | ORAL | 0 refills | Status: DC

## 2020-12-09 NOTE — Progress Notes (Signed)
Primary Care Physician: [name], MD Primary Cardiologist: Dr Marlou Porch Primary Electrophysiologist: none Referring Physician: Dr Louis Meckel is a 70 y.o. male with a history of HLD, HTN, prior CVA, atrial fibrillation who presents for consultation in the [facility].  The patient was initially diagnosed with atrial fibrillation 04/2018 after presenting to the ED with symptoms of stroke. Patient is on Eliquis for a CHADS2VASC score of 4. He has been in permanent rate controlled afib since. He was recently at the beach and was stung by a bee on 12/06/20. He has had head and facial swelling, no trouble breathing. He has noted increased heart rates, decreased appetite, diaphoresis, and some episodes of dark urine. He is in afib with elevated rates today.    Today, he denies symptoms of palpitations, chest pain, shortness of breath, orthopnea, PND, lower extremity edema, presyncope, syncope, snoring, daytime somnolence, bleeding, or neurologic sequela. The patient is tolerating medications without difficulties and is otherwise without complaint today.    Atrial Fibrillation Risk Factors:  he does not have symptoms or diagnosis of sleep apnea. he does not have a history of rheumatic fever.   he has a BMI of Body mass index is 25.45 kg/m.Marland Kitchen Filed Weights   12/09/20 1332  Weight: 92.4 kg    No family history on file.   Atrial Fibrillation Management history:  Previous antiarrhythmic drugs: none Previous cardioversions: none Previous ablations: none CHADS2VASC score: 4 Anticoagulation history: Eliquis   Past Medical History:  Diagnosis Date   Cancer [facility])    Prostate cancer -dx 5'16   Hypercholesterolemia    no meds due to adverse reaction   Past Surgical History:  Procedure Laterality Date   BIOPSY PROSTATE     5'16   CERVICAL FUSION     COLONOSCOPY W/ POLYPECTOMY     x2 benign   IR CT HEAD LTD  05/17/2018   IR PERCUTANEOUS ART  THROMBECTOMY/INFUSION INTRACRANIAL INC DIAG ANGIO  05/17/2018   IR US GUIDE VASC ACCESS RIGHT  05/17/2018   RADIOLOGY WITH ANESTHESIA N/A 05/17/2018   Procedure: IR WITH ANESTHESIA;  Surgeon: Corrie Mckusick, DO;  Location: [place];  Service: Anesthesiology;  Laterality: N/A;   ROBOT ASSISTED LAPAROSCOPIC RADICAL PROSTATECTOMY N/A 11/02/2014   Procedure: ROBOTIC ASSISTED LAPAROSCOPIC RADICAL PROSTATECTOMY LEVEL 1;  Surgeon: Raynelle Bring, MD;  Location: [facility];  Service: Urology;  Laterality: N/A;   TONSILLECTOMY      Current Outpatient Medications  Medication Sig Dispense Refill   apixaban (ELIQUIS) 5 MG TABS tablet Take 1 tablet (5 mg total) by mouth 2 (two) times daily. 60 tablet 2   diltiazem (CARDIZEM CD) 120 MG 24 hr capsule TAKE 1 CAPSULE BY MOUTH EVERY DAY 90 capsule 3   rosuvastatin (CRESTOR) 20 MG tablet Take 1 tablet (20 mg total) by mouth daily at 6 PM. 30 tablet 2   No current facility-administered medications for this encounter.    Allergies  Allergen Reactions   Hctz [Hydrochlorothiazide] Other (See Comments)    Myalgia/ raises blood sugar   Lipitor [Atorvastatin Calcium] Other (See Comments)    Myalgia/ raises blood sugar   Pineapple Swelling    Tongue swelling    Social History   Socioeconomic History   Marital status: Married    Spouse name: Not on file   Number of children: Not on file   Years of education: Not on file   Highest education level: Not on file  Occupational  History   Not on file  Tobacco Use   Smoking status: Never   Smokeless tobacco: Never  Vaping Use   Vaping Use: Never used  Substance and Sexual Activity   Alcohol use: Yes    Comment: one drink daily   Drug use: No   Sexual activity: Not on file  Other Topics Concern   Not on file  Social History Narrative   Not on file   Social Determinants of Health   Financial Resource Strain: Not on file  Food Insecurity: Not on file  Transportation Needs: Not on file  Physical Activity: Not on  file  Stress: Not on file  Social Connections: Not on file  Intimate Partner Violence: Not on file     ROS- All systems are reviewed and negative except as per the HPI above.  Physical Exam: Vitals:   12/09/20 1332  BP: (!) 132/92  Pulse: (!) 109  Weight: 92.4 kg  Height: '6\' 3"'$  (1.905 m)    GEN- The patient is a well appearing male, alert and oriented x 3 today.   Head- normocephalic, atraumatic Eyes-  Sclera clear, conjunctiva pink Ears- hearing intact Oropharynx- clear Neck- supple  Lungs- Clear to ausculation bilaterally, normal work of breathing Heart- irregular rate and rhythm, no murmurs, rubs or gallops  GI- soft, NT, ND, + BS Extremities- no clubbing, cyanosis, or edema MS- no significant deformity or atrophy Skin- no rash or lesion Psych- euthymic mood, full affect Neuro- strength and sensation are intact  Wt Readings from Last 3 Encounters:  12/09/20 92.4 kg  02/06/20 95.7 kg  05/16/19 93.9 kg    EKG today demonstrates  Afib Vent. rate 109 BPM PR interval * ms QRS duration 78 ms QT/QTcB 342/460 ms  Echo 04/18/19 demonstrated   1. Left ventricular ejection fraction, by visual estimation, is 55 to  60%. The left ventricle has normal function. There is no left ventricular  hypertrophy.   2. Left ventricular diastolic function could not be evaluated.   3. The left ventricle has no regional wall motion abnormalities.   4. Global right ventricle has normal systolic function.The right  ventricular size is normal.   5. Left atrial size was mildly dilated.   6. Right atrial size was mildly dilated.   7. The mitral valve is normal in structure. Mild mitral valve  regurgitation. No evidence of mitral stenosis.   8. The tricuspid valve is normal in structure. Tricuspid valve  regurgitation is mild.   9. The aortic valve is tricuspid. Aortic valve regurgitation is not  visualized. Mild aortic valve sclerosis without stenosis.  10. The pulmonic valve was  normal in structure. Pulmonic valve  regurgitation is not visualized.  11. Normal pulmonary artery systolic pressure.  12. The inferior vena cava is dilated in size with >50% respiratory  variability, suggesting right atrial pressure of 8 mmHg.  13. Normal LV function; mild biatrial enlargement; mild MR and TR.   Epic records are reviewed at length today  CHA2DS2-VASc Score = 3  The patient's score is based upon: CHF History: 0 HTN History: 0 Diabetes History: 0 Stroke History: 2 Vascular Disease History: 0 Age Score: 1 Gender Score: 0       ASSESSMENT AND PLAN: 1. Permanent Atrial Fibrillation (ICD10:  I48.11) The patient's CHA2DS2-VASc score is 3, indicating a 3.2% annual risk of stroke.   Patient has noted elevated heart rates recently, suspect 2/2 bee sting.  Will temporarily increase diltiazem to 120 mg BID for  better rate control. Continue Eliquis 5 mg BID  2. Secondary Hypercoagulable State (ICD10:  D68.69) The patient is at significant risk for stroke/thromboembolism based upon his CHA2DS2-VASc Score of 3.  Continue Apixaban (Eliquis).   3. HTN Stable, med changes as above.  4. Bee sting Will prescribe prednisone dose pack. Check bmet/cbc.   Follow up in AF clinic next week and Dr Marlou Porch in 2-3 months.    [facility] [address] [place], [place] [address] [phone] 12/09/2020 1:42 PM

## 2020-12-09 NOTE — Patient Instructions (Signed)
Prednisone taper --  Day 1: '10mg'$  before breakfast, '5mg'$  after lunch and dinner and '10mg'$  at bedtime Day 2: '5mg'$  before breakfast, lunch and dinner and '10mg'$  at bedtime Day 3: '5mg'$  before breakfast, lunch, dinner and bedtime Day 4: '5mg'$  before breakfast, lunch and bedtime Day 5; '5mg'$  before breakfast and at bedtime Day 6: '6mg'$  at breakfast  Increase cardizem to '120mg'$  twice a day

## 2020-12-17 ENCOUNTER — Ambulatory Visit (HOSPITAL_COMMUNITY)
Admission: RE | Admit: 2020-12-17 | Discharge: 2020-12-17 | Disposition: A | Discharge status: Home / Self Care | Payer: Medicare Other | Source: Ambulatory Visit | Attending: Physician Assistant | Admitting: Physician Assistant

## 2020-12-17 ENCOUNTER — Other Ambulatory Visit: Payer: Self-pay

## 2020-12-17 VITALS — BP 130/82 | HR 71 | Ht 75.0 in | Wt 207.2 lb

## 2020-12-17 DIAGNOSIS — Z8673 Personal history of transient ischemic attack (TIA), and cerebral infarction without residual deficits: Secondary | ICD-10-CM | POA: Insufficient documentation

## 2020-12-17 DIAGNOSIS — D6869 Other thrombophilia: Secondary | ICD-10-CM

## 2020-12-17 DIAGNOSIS — Z7901 Long term (current) use of anticoagulants: Secondary | ICD-10-CM | POA: Diagnosis not present

## 2020-12-17 DIAGNOSIS — I10 Essential (primary) hypertension: Secondary | ICD-10-CM | POA: Diagnosis not present

## 2020-12-17 DIAGNOSIS — I4821 Permanent atrial fibrillation: Secondary | ICD-10-CM

## 2020-12-17 DIAGNOSIS — Z79899 Other long term (current) drug therapy: Secondary | ICD-10-CM | POA: Insufficient documentation

## 2020-12-17 DIAGNOSIS — E785 Hyperlipidemia, unspecified: Secondary | ICD-10-CM | POA: Insufficient documentation

## 2020-12-17 DIAGNOSIS — Z09 Encounter for follow-up examination after completed treatment for conditions other than malignant neoplasm: Secondary | ICD-10-CM | POA: Diagnosis not present

## 2020-12-17 MED ORDER — DILTIAZEM HCL ER COATED BEADS 120 MG PO CP24: 120.0000 mg | ORAL_CAPSULE | Freq: Two times a day (BID) | ORAL | 2 refills | Status: DC

## 2020-12-17 NOTE — Progress Notes (Signed)
Primary Care Physician: [name], MD Primary Cardiologist: Dr Marlou Porch Primary Electrophysiologist: none Referring Physician: Dr Louis Meckel is a 70 y.o. male with a history of HLD, HTN, prior CVA, atrial fibrillation who presents for follow up in the [facility].  The patient was initially diagnosed with atrial fibrillation 04/2018 after presenting to the ED with symptoms of stroke. Patient is on Eliquis for a CHADS2VASC score of 4. He has been in permanent rate controlled afib since. He was recently at the beach and was stung by a bee on 12/06/20. He has had head and facial swelling, no trouble breathing. He has noted increased heart rates, decreased appetite, diaphoresis, and some episodes of dark urine.   On follow up today, patient reports he has done well since his last visit. His facial swelling has resolved. He denies heart racing or palpitations. He denies any bleeding issues on anticoagulation.    Today, he denies symptoms of palpitations, chest pain, shortness of breath, orthopnea, PND, lower extremity edema, presyncope, syncope, snoring, daytime somnolence, bleeding, or neurologic sequela. The patient is tolerating medications without difficulties and is otherwise without complaint today.    Atrial Fibrillation Risk Factors:  he does not have symptoms or diagnosis of sleep apnea. he does not have a history of rheumatic fever.   he has a BMI of Body mass index is 25.9 kg/m.Marland Kitchen Filed Weights   12/17/20 1133  Weight: 94 kg    No family history on file.   Atrial Fibrillation Management history:  Previous antiarrhythmic drugs: none Previous cardioversions: none Previous ablations: none CHADS2VASC score: 4 Anticoagulation history: Eliquis   Past Medical History:  Diagnosis Date   Cancer [facility])    Prostate cancer -dx 5'16   Hypercholesterolemia    no meds due to adverse reaction   Past Surgical History:  Procedure  Laterality Date   BIOPSY PROSTATE     5'16   CERVICAL FUSION     COLONOSCOPY W/ POLYPECTOMY     x2 benign   IR CT HEAD LTD  05/17/2018   IR PERCUTANEOUS ART THROMBECTOMY/INFUSION INTRACRANIAL INC DIAG ANGIO  05/17/2018   IR US GUIDE VASC ACCESS RIGHT  05/17/2018   RADIOLOGY WITH ANESTHESIA N/A 05/17/2018   Procedure: IR WITH ANESTHESIA;  Surgeon: Corrie Mckusick, DO;  Location: [place];  Service: Anesthesiology;  Laterality: N/A;   ROBOT ASSISTED LAPAROSCOPIC RADICAL PROSTATECTOMY N/A 11/02/2014   Procedure: ROBOTIC ASSISTED LAPAROSCOPIC RADICAL PROSTATECTOMY LEVEL 1;  Surgeon: Raynelle Bring, MD;  Location: [facility];  Service: Urology;  Laterality: N/A;   TONSILLECTOMY      Current Outpatient Medications  Medication Sig Dispense Refill   apixaban (ELIQUIS) 5 MG TABS tablet Take 1 tablet (5 mg total) by mouth 2 (two) times daily. 60 tablet 2   rosuvastatin (CRESTOR) 20 MG tablet Take 1 tablet (20 mg total) by mouth daily at 6 PM. 30 tablet 2   diltiazem (CARDIZEM CD) 120 MG 24 hr capsule Take 1 capsule (120 mg total) by mouth 2 (two) times daily. 180 capsule 2   No current facility-administered medications for this encounter.    Allergies  Allergen Reactions   Hctz [Hydrochlorothiazide] Other (See Comments)    Myalgia/ raises blood sugar   Lipitor [Atorvastatin Calcium] Other (See Comments)    Myalgia/ raises blood sugar   Pineapple Swelling    Tongue swelling    Social History   Socioeconomic History   Marital status: Married  Spouse name: Not on file   Number of children: Not on file   Years of education: Not on file   Highest education level: Not on file  Occupational History   Not on file  Tobacco Use   Smoking status: Never   Smokeless tobacco: Never  Vaping Use   Vaping Use: Never used  Substance and Sexual Activity   Alcohol use: Yes    Comment: one drink daily   Drug use: No   Sexual activity: Not on file  Other Topics Concern   Not on file  Social History  Narrative   Not on file   Social Determinants of Health   Financial Resource Strain: Not on file  Food Insecurity: Not on file  Transportation Needs: Not on file  Physical Activity: Not on file  Stress: Not on file  Social Connections: Not on file  Intimate Partner Violence: Not on file     ROS- All systems are reviewed and negative except as per the HPI above.  Physical Exam: Vitals:   12/17/20 1133  BP: 130/82  Pulse: 71  Weight: 94 kg  Height: 6\' 3"  (1.905 m)    GEN- The patient is a well appearing male, alert and oriented x 3 today.   HEENT-head normocephalic, atraumatic, sclera clear, conjunctiva pink, hearing intact, trachea midline. Lungs- Clear to ausculation bilaterally, normal work of breathing Heart- irregular rate and rhythm, no murmurs, rubs or gallops  GI- soft, NT, ND, + BS Extremities- no clubbing, cyanosis, or edema MS- no significant deformity or atrophy Skin- no rash or lesion Psych- euthymic mood, full affect Neuro- strength and sensation are intact   Wt Readings from Last 3 Encounters:  12/17/20 94 kg  12/09/20 92.4 kg  02/06/20 95.7 kg    EKG today demonstrates  Afib Vent. rate 71 BPM PR interval * ms QRS duration 78 ms QT/QTcB 404/439 ms  Echo 04/18/19 demonstrated   1. Left ventricular ejection fraction, by visual estimation, is 55 to  60%. The left ventricle has normal function. There is no left ventricular hypertrophy.   2. Left ventricular diastolic function could not be evaluated.   3. The left ventricle has no regional wall motion abnormalities.   4. Global right ventricle has normal systolic function.The right  ventricular size is normal.   5. Left atrial size was mildly dilated.   6. Right atrial size was mildly dilated.   7. The mitral valve is normal in structure. Mild mitral valve  regurgitation. No evidence of mitral stenosis.   8. The tricuspid valve is normal in structure. Tricuspid valve  regurgitation is mild.   9.  The aortic valve is tricuspid. Aortic valve regurgitation is not  visualized. Mild aortic valve sclerosis without stenosis.  10. The pulmonic valve was normal in structure. Pulmonic valve  regurgitation is not visualized.  11. Normal pulmonary artery systolic pressure.  12. The inferior vena cava is dilated in size with >50% respiratory  variability, suggesting right atrial pressure of 8 mmHg.  13. Normal LV function; mild biatrial enlargement; mild MR and TR.   Epic records are reviewed at length today  CHA2DS2-VASc Score = 3  The patient's score is based upon: CHF History: 0 HTN History: 0 Diabetes History: 0 Stroke History: 2 Vascular Disease History: 0 Age Score: 1 Gender Score: 0       ASSESSMENT AND PLAN: 1. Permanent Atrial Fibrillation (ICD10:  I48.11) The patient's CHA2DS2-VASc score is 3, indicating a 3.2% annual risk of stroke.  Patient in rate controlled asymptomatic afib. He has felt well on the higher dose of diltiazem. We discussed keeping him on the higher dose and he is in agreement.  Continue diltiazem 120 mg BID Continue Eliquis 5 mg BID  2. Secondary Hypercoagulable State (ICD10:  D68.69) The patient is at significant risk for stroke/thromboembolism based upon his CHA2DS2-VASc Score of 3.  Continue Apixaban (Eliquis).   3. HTN Stable, no changes today.  4. Bee sting Resolved, off prednisone.   Follow up with Dr Marlou Porch as scheduled.    [facility] [address] [place],  [address] [phone] 12/17/2020 12:47 PM

## 2020-12-27 DIAGNOSIS — Z23 Encounter for immunization: Secondary | ICD-10-CM | POA: Diagnosis not present

## 2021-01-04 ENCOUNTER — Other Ambulatory Visit: Payer: Self-pay

## 2021-01-04 ENCOUNTER — Encounter: Payer: Self-pay | Admitting: Cardiology

## 2021-01-04 ENCOUNTER — Ambulatory Visit (INDEPENDENT_AMBULATORY_CARE_PROVIDER_SITE_OTHER): Payer: Medicare Other | Admitting: Cardiology

## 2021-01-04 DIAGNOSIS — I4821 Permanent atrial fibrillation: Secondary | ICD-10-CM | POA: Diagnosis not present

## 2021-01-04 DIAGNOSIS — I10 Essential (primary) hypertension: Secondary | ICD-10-CM | POA: Diagnosis not present

## 2021-01-04 DIAGNOSIS — I63411 Cerebral infarction due to embolism of right middle cerebral artery: Secondary | ICD-10-CM | POA: Diagnosis not present

## 2021-01-04 DIAGNOSIS — E785 Hyperlipidemia, unspecified: Secondary | ICD-10-CM

## 2021-01-04 DIAGNOSIS — D6869 Other thrombophilia: Secondary | ICD-10-CM

## 2021-01-04 MED ORDER — ROSUVASTATIN CALCIUM 20 MG PO TABS: 20.0000 mg | ORAL_TABLET | Freq: Every day | ORAL | 2 refills | Status: DC

## 2021-01-04 NOTE — Assessment & Plan Note (Signed)
Currently well controlled blood pressure.  No changes.

## 2021-01-04 NOTE — Assessment & Plan Note (Signed)
Currently rate controlled on diltiazem 120 mg twice a day.  Continue with Eliquis 5 mg twice a day as well.

## 2021-01-04 NOTE — Progress Notes (Signed)
Cardiology Office Note:    Date:  01/04/2021   ID:  Patrick Salinas, DOB Jun 22, 1950, MRN [id number]  PCP:  [name], MD   [facility] Providers Cardiologist:  Candee Furbish, MD     Referring MD: [name], MD   History of Present Illness:    Patrick Salinas is a 70 y.o. male here for follow-up of persistent atrial fibrillation.  Recently seen in the atrial fibrillation clinic twice last on 12/17/2020 after an episode of facial swelling, heart racing palpitations RVR.  He was recently at the beach and stung on 12/06/2020.  He was aggressively rehydrated, Cardizem was increased to 120 twice a day.  Right MCA stroke 2020. TPA   Blood pressure fairly soft at times following stroke but overall is doing well.  Currently doing quite well with this.  He is excited about trip to [place] and [place]. [place]   Has had some presyncopal episodes at times possible orthostatics or dehydration.  Normal EF.   After second Covid vaccine he felt diaphoretic for a few hours dehydrated.  His son who works for EMS checked him out.  Has had some orthostatic issues as described above following his stroke.   Overall recently has been doing very well.  Has been able to play tennis for several hours duration without any difficulty.  Overall he is feeling better.  No fevers chills nausea vomiting syncope bleeding.  Past Medical History:  Diagnosis Date   Cancer [facility])    Prostate cancer -dx 5'16   Hypercholesterolemia    no meds due to adverse reaction    Past Surgical History:  Procedure Laterality Date   BIOPSY PROSTATE     5'16   CERVICAL FUSION     COLONOSCOPY W/ POLYPECTOMY     x2 benign   IR CT HEAD LTD  05/17/2018   IR PERCUTANEOUS ART THROMBECTOMY/INFUSION INTRACRANIAL INC DIAG ANGIO  05/17/2018   IR US GUIDE VASC ACCESS RIGHT  05/17/2018   RADIOLOGY WITH ANESTHESIA N/A 05/17/2018   Procedure: IR WITH ANESTHESIA;  Surgeon: Corrie Mckusick, DO;  Location: [place];  Service:  Anesthesiology;  Laterality: N/A;   ROBOT ASSISTED LAPAROSCOPIC RADICAL PROSTATECTOMY N/A 11/02/2014   Procedure: ROBOTIC ASSISTED LAPAROSCOPIC RADICAL PROSTATECTOMY LEVEL 1;  Surgeon: Raynelle Bring, MD;  [facility];  Service: Urology;  Laterality: N/A;   TONSILLECTOMY      Current Medications: Current Meds  Medication Sig   apixaban (ELIQUIS) 5 MG TABS tablet Take 1 tablet (5 mg total) by mouth 2 (two) times daily.   diltiazem (CARDIZEM CD) 120 MG 24 hr capsule Take 1 capsule (120 mg total) by mouth 2 (two) times daily.   [DISCONTINUED] rosuvastatin (CRESTOR) 20 MG tablet Take 1 tablet (20 mg total) by mouth daily at 6 PM.     Allergies:   Hctz [hydrochlorothiazide], Lipitor [atorvastatin calcium], and Pineapple   Social History   Socioeconomic History   Marital status: Married    Spouse name: Not on file   Number of children: Not on file   Years of education: Not on file   Highest education level: Not on file  Occupational History   Not on file  Tobacco Use   Smoking status: Never   Smokeless tobacco: Never  Vaping Use   Vaping Use: Never used  Substance and Sexual Activity   Alcohol use: Yes    Comment: one drink daily   Drug use: No   Sexual activity: Not on file  Other  Topics Concern   Not on file  Social History Narrative   Not on file   Social Determinants of Health   Financial Resource Strain: Not on file  Food Insecurity: Not on file  Transportation Needs: Not on file  Physical Activity: Not on file  Stress: Not on file  Social Connections: Not on file     Family History: The patient's family history is not on file.  ROS:   Please see the history of present illness.     All other systems reviewed and are negative.  EKGs/Labs/Other Studies Reviewed:    The following studies were reviewed today: ECHO 2021 Normal pump function, EF 55 to 60%.  Mild aortic valve calcification, mild tricuspid regurgitation.  Overall reassuring  echocardiogram.  Event monitor 2021  Atrial fibrillation average heart rate 86 bpm, minimum 52 bpm, maximum 197 bpm  No pauses  Overall well controlled atrial fibrillation Recent Labs: 12/09/2020: BUN 12; Creatinine, Ser 1.05; Hemoglobin 17.0; Platelets 203; Potassium 4.7; Sodium 134  Recent Lipid Panel    Component Value Date/Time   CHOL 217 (H) 05/18/2018 0617   TRIG 66 05/18/2018 0617   HDL 50 05/18/2018 0617   CHOLHDL 4.3 05/18/2018 0617   VLDL 13 05/18/2018 0617   LDLCALC 154 (H) 05/18/2018 0617     Risk Assessment/Calculations:          Physical Exam:    VS:  BP 136/72   Pulse 80   Ht 6\' 3"  (1.905 m)   Wt 209 lb 12.8 oz (95.2 kg)   SpO2 97%   BMI 26.22 kg/m     Wt Readings from Last 3 Encounters:  01/04/21 209 lb 12.8 oz (95.2 kg)  12/17/20 207 lb 3.2 oz (94 kg)  12/09/20 203 lb 9.6 oz (92.4 kg)     GEN:  Well nourished, well developed in no acute distress HEENT: Normal NECK: No JVD; No carotid bruits LYMPHATICS: No lymphadenopathy CARDIAC: Irregularly irregular normal rate, no murmurs, rubs, gallops RESPIRATORY:  Clear to auscultation without rales, wheezing or rhonchi  ABDOMEN: Soft, non-tender, non-distended MUSCULOSKELETAL:  No edema; No deformity  SKIN: Warm and dry NEUROLOGIC:  Alert and oriented x 3 PSYCHIATRIC:  Normal affect   ASSESSMENT:    1. Embolic stroke involving right middle cerebral artery (HCC) s/p tPA and mechanical thrombectomy, d/t AF   2. Permanent atrial fibrillation ([place])   3. Essential hypertension   4. Hyperlipidemia LDL goal <70   5. Secondary hypercoagulable state ([place])    PLAN:    In order of problems listed above: Embolic stroke involving right middle cerebral artery ([place]) s/p tPA and mechanical thrombectomy, d/t AF Overall doing well.  Continue with goal-directed medical therapy.  Atrial fibrillation (HCC) Currently rate controlled on diltiazem 120 mg twice a day.  Continue with Eliquis 5 mg twice a day as  well.  Essential hypertension Currently well controlled blood pressure.  No changes.  Hyperlipidemia LDL goal <70 Continue with Crestor 20 mg once a day.  LDL goal less than 70.  No myalgias.  Secondary hypercoagulable state ([place]) Eliquis.  CHA2DS2-VASc score 4.     Medication Adjustments/Labs and Tests Ordered: Current medicines are reviewed at length with the patient today.  Concerns regarding medicines are outlined above.  No orders of the defined types were placed in this encounter.  Meds ordered this encounter  Medications   rosuvastatin (CRESTOR) 20 MG tablet    Sig: Take 1 tablet (20 mg total) by mouth daily at 6  PM.    Dispense:  90 tablet    Refill:  2    Patient Instructions  Medication Instructions:  Your physician recommends that you continue on your current medications as directed. Please refer to the Current Medication list given to you today.  *If you need a refill on your cardiac medications before your next appointment, please call your pharmacy*   Lab Work:  If you have labs (blood work) drawn today and your tests are completely normal, you will receive your results only by: [place] (if you have MyChart) OR A paper copy in the mail If you have any lab test that is abnormal or we need to change your treatment, we will call you to review the results.   Testing/Procedures: None ordered   Follow-Up: At [facility], you and your health needs are our priority.  As part of our continuing mission to provide you with exceptional heart care, we have created designated Provider Care Teams.  These Care Teams include your primary Cardiologist (physician) and Advanced Practice Providers (APPs -  Physician Assistants and Nurse Practitioners) who all work together to provide you with the care you need, when you need it.  We recommend signing up for the patient portal called "MyChart".  Sign up information is provided on this After Visit Summary.  MyChart is  used to connect with patients for Virtual Visits (Telemedicine).  Patients are able to view lab/test results, encounter notes, upcoming appointments, etc.  Non-urgent messages can be sent to your provider as well.   To learn more about what you can do with MyChart, go to [email or web address].    Your next appointment:   6 month(s)  The format for your next appointment:   In Person  Provider:   Candee Furbish, MD   Other Instructions    Signed, Candee Furbish, MD  01/04/2021 2:42 PM    [place]

## 2021-01-04 NOTE — Patient Instructions (Signed)
Medication Instructions:  Your physician recommends that you continue on your current medications as directed. Please refer to the Current Medication list given to you today.  *If you need a refill on your cardiac medications before your next appointment, please call your pharmacy*   Lab Work:  If you have labs (blood work) drawn today and your tests are completely normal, you will receive your results only by: [place] (if you have MyChart) OR A paper copy in the mail If you have any lab test that is abnormal or we need to change your treatment, we will call you to review the results.   Testing/Procedures: None ordered   Follow-Up: At [facility], you and your health needs are our priority.  As part of our continuing mission to provide you with exceptional heart care, we have created designated Provider Care Teams.  These Care Teams include your primary Cardiologist (physician) and Advanced Practice Providers (APPs -  Physician Assistants and Nurse Practitioners) who all work together to provide you with the care you need, when you need it.  We recommend signing up for the patient portal called "MyChart".  Sign up information is provided on this After Visit Summary.  MyChart is used to connect with patients for Virtual Visits (Telemedicine).  Patients are able to view lab/test results, encounter notes, upcoming appointments, etc.  Non-urgent messages can be sent to your provider as well.   To learn more about what you can do with MyChart, go to [email or web address].    Your next appointment:   6 month(s)  The format for your next appointment:   In Person  Provider:   Candee Furbish, MD   Other Instructions

## 2021-01-04 NOTE — Assessment & Plan Note (Signed)
Overall doing well.  Continue with goal-directed medical therapy.

## 2021-01-04 NOTE — Assessment & Plan Note (Signed)
Eliquis.  CHA2DS2-VASc score 4.

## 2021-01-04 NOTE — Assessment & Plan Note (Signed)
Continue with Crestor 20 mg once a day.  LDL goal less than 70.  No myalgias.

## 2021-02-22 ENCOUNTER — Encounter: Payer: Self-pay | Admitting: Cardiology

## 2021-03-03 DIAGNOSIS — Z20822 Contact with and (suspected) exposure to covid-19: Secondary | ICD-10-CM | POA: Diagnosis not present

## 2021-03-09 DIAGNOSIS — Z86010 Personal history of colonic polyps: Secondary | ICD-10-CM | POA: Diagnosis not present

## 2021-03-09 DIAGNOSIS — Z09 Encounter for follow-up examination after completed treatment for conditions other than malignant neoplasm: Secondary | ICD-10-CM | POA: Diagnosis not present

## 2021-03-09 DIAGNOSIS — K649 Unspecified hemorrhoids: Secondary | ICD-10-CM | POA: Diagnosis not present

## 2021-03-09 DIAGNOSIS — D124 Benign neoplasm of descending colon: Secondary | ICD-10-CM | POA: Diagnosis not present

## 2021-03-09 DIAGNOSIS — K648 Other hemorrhoids: Secondary | ICD-10-CM | POA: Diagnosis not present

## 2021-03-09 DIAGNOSIS — Z85038 Personal history of other malignant neoplasm of large intestine: Secondary | ICD-10-CM | POA: Diagnosis not present

## 2021-03-09 DIAGNOSIS — K635 Polyp of colon: Secondary | ICD-10-CM | POA: Diagnosis not present

## 2021-03-09 DIAGNOSIS — K621 Rectal polyp: Secondary | ICD-10-CM | POA: Diagnosis not present

## 2021-04-26 DIAGNOSIS — Z8546 Personal history of malignant neoplasm of prostate: Secondary | ICD-10-CM | POA: Diagnosis not present

## 2021-04-26 DIAGNOSIS — E7800 Pure hypercholesterolemia, unspecified: Secondary | ICD-10-CM | POA: Diagnosis not present

## 2021-04-26 DIAGNOSIS — D6869 Other thrombophilia: Secondary | ICD-10-CM | POA: Diagnosis not present

## 2021-04-26 DIAGNOSIS — Z1389 Encounter for screening for other disorder: Secondary | ICD-10-CM | POA: Diagnosis not present

## 2021-04-26 DIAGNOSIS — M5030 Other cervical disc degeneration, unspecified cervical region: Secondary | ICD-10-CM | POA: Diagnosis not present

## 2021-04-26 DIAGNOSIS — Z8673 Personal history of transient ischemic attack (TIA), and cerebral infarction without residual deficits: Secondary | ICD-10-CM | POA: Diagnosis not present

## 2021-04-26 DIAGNOSIS — I4891 Unspecified atrial fibrillation: Secondary | ICD-10-CM | POA: Diagnosis not present

## 2021-04-26 DIAGNOSIS — I10 Essential (primary) hypertension: Secondary | ICD-10-CM | POA: Diagnosis not present

## 2021-04-26 DIAGNOSIS — Z800 Family history of malignant neoplasm of digestive organs: Secondary | ICD-10-CM | POA: Diagnosis not present

## 2021-04-26 DIAGNOSIS — N529 Male erectile dysfunction, unspecified: Secondary | ICD-10-CM | POA: Diagnosis not present

## 2021-04-26 DIAGNOSIS — Z0000 Encounter for general adult medical examination without abnormal findings: Secondary | ICD-10-CM | POA: Diagnosis not present

## 2021-04-26 DIAGNOSIS — R7303 Prediabetes: Secondary | ICD-10-CM | POA: Diagnosis not present

## 2021-05-23 DIAGNOSIS — Z20822 Contact with and (suspected) exposure to covid-19: Secondary | ICD-10-CM | POA: Diagnosis not present

## 2021-06-08 DIAGNOSIS — Z20822 Contact with and (suspected) exposure to covid-19: Secondary | ICD-10-CM | POA: Diagnosis not present

## 2021-06-17 DIAGNOSIS — Z20822 Contact with and (suspected) exposure to covid-19: Secondary | ICD-10-CM | POA: Diagnosis not present

## 2021-06-30 DIAGNOSIS — Z20822 Contact with and (suspected) exposure to covid-19: Secondary | ICD-10-CM | POA: Diagnosis not present

## 2021-07-30 DIAGNOSIS — Z20822 Contact with and (suspected) exposure to covid-19: Secondary | ICD-10-CM | POA: Diagnosis not present

## 2021-10-01 ENCOUNTER — Other Ambulatory Visit: Payer: Self-pay | Admitting: Cardiology

## 2021-10-07 DIAGNOSIS — D6869 Other thrombophilia: Secondary | ICD-10-CM | POA: Diagnosis not present

## 2021-10-07 DIAGNOSIS — L820 Inflamed seborrheic keratosis: Secondary | ICD-10-CM | POA: Diagnosis not present

## 2021-10-07 DIAGNOSIS — R7303 Prediabetes: Secondary | ICD-10-CM | POA: Diagnosis not present

## 2021-10-07 DIAGNOSIS — I10 Essential (primary) hypertension: Secondary | ICD-10-CM | POA: Diagnosis not present

## 2021-10-07 DIAGNOSIS — I4891 Unspecified atrial fibrillation: Secondary | ICD-10-CM | POA: Diagnosis not present

## 2021-10-07 DIAGNOSIS — E7800 Pure hypercholesterolemia, unspecified: Secondary | ICD-10-CM | POA: Diagnosis not present

## 2021-11-27 DIAGNOSIS — R5383 Other fatigue: Secondary | ICD-10-CM | POA: Diagnosis not present

## 2021-11-27 DIAGNOSIS — R0981 Nasal congestion: Secondary | ICD-10-CM | POA: Diagnosis not present

## 2021-11-27 DIAGNOSIS — R509 Fever, unspecified: Secondary | ICD-10-CM | POA: Diagnosis not present

## 2021-11-27 DIAGNOSIS — Z03818 Encounter for observation for suspected exposure to other biological agents ruled out: Secondary | ICD-10-CM | POA: Diagnosis not present

## 2021-11-28 ENCOUNTER — Emergency Department (HOSPITAL_COMMUNITY)
Admission: EM | Admit: 2021-11-28 | Discharge: 2021-11-28 | Disposition: A | Discharge status: Home / Self Care | Payer: Medicare Other | Attending: Emergency Medicine | Admitting: Emergency Medicine

## 2021-11-28 ENCOUNTER — Encounter (HOSPITAL_COMMUNITY): Payer: Self-pay

## 2021-11-28 ENCOUNTER — Other Ambulatory Visit: Payer: Self-pay

## 2021-11-28 DIAGNOSIS — I10 Essential (primary) hypertension: Secondary | ICD-10-CM | POA: Insufficient documentation

## 2021-11-28 DIAGNOSIS — I4891 Unspecified atrial fibrillation: Secondary | ICD-10-CM | POA: Diagnosis not present

## 2021-11-28 DIAGNOSIS — R439 Unspecified disturbances of smell and taste: Secondary | ICD-10-CM | POA: Diagnosis not present

## 2021-11-28 DIAGNOSIS — H532 Diplopia: Secondary | ICD-10-CM | POA: Diagnosis not present

## 2021-11-28 DIAGNOSIS — H5712 Ocular pain, left eye: Secondary | ICD-10-CM | POA: Insufficient documentation

## 2021-11-28 DIAGNOSIS — R0981 Nasal congestion: Secondary | ICD-10-CM | POA: Diagnosis not present

## 2021-11-28 DIAGNOSIS — Z8546 Personal history of malignant neoplasm of prostate: Secondary | ICD-10-CM | POA: Insufficient documentation

## 2021-11-28 DIAGNOSIS — Z7901 Long term (current) use of anticoagulants: Secondary | ICD-10-CM | POA: Insufficient documentation

## 2021-11-28 LAB — CBC WITH DIFFERENTIAL/PLATELET
Abs Immature Granulocytes: 0.03 10*3/uL (ref 0.00–0.07)
Basophils Absolute: 0 10*3/uL (ref 0.0–0.1)
Basophils Relative: 0 %
Eosinophils Absolute: 0.1 10*3/uL (ref 0.0–0.5)
Eosinophils Relative: 1 %
HCT: 50.1 % (ref 39.0–52.0)
Hemoglobin: 16.8 g/dL (ref 13.0–17.0)
Immature Granulocytes: 0 %
Lymphocytes Relative: 14 %
Lymphs Abs: 1.3 10*3/uL (ref 0.7–4.0)
MCH: 30.9 pg (ref 26.0–34.0)
MCHC: 33.5 g/dL (ref 30.0–36.0)
MCV: 92.3 fL (ref 80.0–100.0)
Monocytes Absolute: 1 10*3/uL (ref 0.1–1.0)
Monocytes Relative: 11 %
Neutro Abs: 6.8 10*3/uL (ref 1.7–7.7)
Neutrophils Relative %: 74 %
Platelets: 237 10*3/uL (ref 150–400)
RBC: 5.43 MIL/uL (ref 4.22–5.81)
RDW: 13 % (ref 11.5–15.5)
WBC: 9.3 10*3/uL (ref 4.0–10.5)
nRBC: 0 % (ref 0.0–0.2)

## 2021-11-28 LAB — COMPREHENSIVE METABOLIC PANEL
ALT: 18 U/L (ref 0–44)
AST: 15 U/L (ref 15–41)
Albumin: 4 g/dL (ref 3.5–5.0)
Alkaline Phosphatase: 77 U/L (ref 38–126)
Anion gap: 8 (ref 5–15)
BUN: 19 mg/dL (ref 8–23)
CO2: 25 mmol/L (ref 22–32)
Calcium: 9.2 mg/dL (ref 8.9–10.3)
Chloride: 107 mmol/L (ref 98–111)
Creatinine, Ser: 1.13 mg/dL (ref 0.61–1.24)
GFR, Estimated: >60 mL/min (ref >60)
Glucose, Bld: 108 mg/dL — ABNORMAL HIGH (ref 70–99)
Potassium: 4.6 mmol/L (ref 3.5–5.1)
Sodium: 140 mmol/L (ref 135–145)
Total Bilirubin: 1.2 mg/dL (ref 0.3–1.2)
Total Protein: 7 g/dL (ref 6.5–8.1)

## 2021-11-28 MED ORDER — FLUORESCEIN SODIUM 1 MG OP STRP
1.0000 | ORAL_STRIP | Freq: Once | OPHTHALMIC | Status: AC
  Administered 2021-11-28: 1 via OPHTHALMIC
  Filled 2021-11-28: qty 1

## 2021-11-28 MED ORDER — TETRACAINE HCL 0.5 % OP SOLN
2.0000 [drp] | Freq: Once | OPHTHALMIC | Status: AC
  Administered 2021-11-28: 2 [drp] via OPHTHALMIC
  Filled 2021-11-28: qty 4

## 2021-11-28 NOTE — ED Provider Notes (Signed)
[facility] DEPT Provider Note   CSN: [id number] Arrival date & time: 11/28/21  1155     History  Chief Complaint  Patient presents with   Eye Pain    Patrick Salinas is a 71 y.o. male.   Eye Pain   71 year old male presents emergency department with complaints of left eye pain and swelling.  Patient states that he began to have symptoms approximately 2 weeks ago with associated nasal congestion as well as decreased sense of smell.  He tried at home Nasacort as well as Pataday eyedrops with minimal to no relief of symptoms.  He was seen in urgent care yesterday and was tested for both Lyme and COVID.  Lyme test is pending at this time and COVID test was negative.  Patient was discharged with Decadron eyedrops of which she has been using since his urgent care visit yesterday.  His wife given 3 additional COVID tests yesterday which were all negative.  He presents emergency department today because he had new onset double vision as of this morning.  He notes some pain and swelling to the lateral aspect of his left eye with no obvious foreign body sensation or drainage noted.  He denies contact lens or corrective lens use.  Denies slurred speech, facial droop, weakness/sensory deficits, abnormal gait, fever, chills, night sweats, dental pain, ear pain.  Denies any known tick bites or associated rashes.  Past medical history significant for embolic stroke, prostate cancer, atrial fibrillation of which she is chronically on Eliquis, hypertension, hyperlipidemia  Home Medications Prior to Admission medications   Medication Sig Start Date End Date Taking? Authorizing Provider  apixaban (ELIQUIS) 5 MG TABS tablet Take 1 tablet (5 mg total) by mouth 2 (two) times daily. 05/20/18   Donzetta Starch, NP  diltiazem (CARDIZEM CD) 120 MG 24 hr capsule Take 1 capsule (120 mg total) by mouth 2 (two) times daily. 12/17/20   Fenton, Clint R, PA  rosuvastatin (CRESTOR) 20 MG tablet  TAKE 1 TABLET BY MOUTH DAILY AT 6 PM. 10/03/21   Jerline Pain, MD      Allergies    Hctz [hydrochlorothiazide], Lipitor [atorvastatin calcium], and Pineapple    Review of Systems   Review of Systems  Eyes:  Positive for pain.  All other systems reviewed and are negative.   Physical Exam Updated Vital Signs BP 131/80   Pulse 81   Temp (!) 97.5 F (36.4 C) (Oral)   Resp 16   Ht '6\' 3"'$  (1.905 m)   Wt 89.8 kg   SpO2 96%   BMI 24.75 kg/m  Physical Exam Vitals and nursing note reviewed.  Constitutional:      General: He is not in acute distress.    Appearance: He is well-developed. He is not ill-appearing.  HENT:     Head: Normocephalic and atraumatic.     Mouth/Throat:     Mouth: Mucous membranes are moist.     Pharynx: Oropharynx is clear.  Eyes:     General: Lids are normal. Lids are everted, no foreign bodies appreciated. Gaze aligned appropriately. No visual field deficit or scleral icterus.       Right eye: No foreign body, discharge or hordeolum.        Left eye: No discharge.     Intraocular pressure: Right eye pressure is 17 mmHg. Left eye pressure is 18 mmHg.     Extraocular Movements: Extraocular movements intact.     Right eye: Normal extraocular  motion and no nystagmus.     Left eye: Normal extraocular motion and no nystagmus.     Conjunctiva/sclera:     Right eye: Right conjunctiva is not injected. No chemosis, exudate or hemorrhage.    Left eye: Left conjunctiva is injected. Chemosis present. No exudate or hemorrhage.    Pupils: Pupils are equal, round, and reactive to light.     Comments: Patient describes no double vision with single or both eyes at near or far vision in the ED room.   Cardiovascular:     Rate and Rhythm: Normal rate and regular rhythm.     Heart sounds: No murmur heard. Pulmonary:     Effort: Pulmonary effort is normal. No respiratory distress.     Breath sounds: Normal breath sounds.  Abdominal:     Palpations: Abdomen is soft.      Tenderness: There is no abdominal tenderness.  Musculoskeletal:        General: No swelling.     Cervical back: Neck supple.  Skin:    General: Skin is warm and dry.     Capillary Refill: Capillary refill takes less than 2 seconds.  Neurological:     Mental Status: He is alert.     Comments: Alert and oriented to self, place, time and event.   Speech is fluent, clear without dysarthria or dysphasia.   Strength 5/5 in upper/lower extremities   Sensation intact in upper/lower extremities   Normal gait.  Negative Romberg. No pronator drift.  Normal finger-to-nose and feet tapping.  CN I not tested  CN II grossly intact visual fields bilaterally. Did not visualize posterior eye.  CN III, IV, VI PERRLA and EOMs intact bilaterally  CN V Intact sensation to sharp and light touch to the face  CN VII facial movements symmetric  CN VIII not tested  CN IX, X no uvula deviation, symmetric rise of soft palate  CN XI 5/5 SCM and trapezius strength bilaterally  CN XII Midline tongue protrusion, symmetric L/R movements   Psychiatric:        Mood and Affect: Mood normal.     ED Results / Procedures / Treatments   Labs (all labs ordered are listed, but only abnormal results are displayed) Labs Reviewed  COMPREHENSIVE METABOLIC PANEL - Abnormal; Notable for the following components:      Result Value   Glucose, Bld 108 (*)    All other components within normal limits  CBC WITH DIFFERENTIAL/PLATELET    EKG None  Radiology No results found.  Procedures Procedures    Medications Ordered in ED Medications  fluorescein ophthalmic strip 1 strip (1 strip Left Eye Given 11/28/21 1259)  tetracaine (PONTOCAINE) 0.5 % ophthalmic solution 2 drop (2 drops Left Eye Given by Other 11/28/21 1259)    ED Course/ Medical Decision Making/ A&P                           Medical Decision Making Amount and/or Complexity of Data Reviewed Labs: ordered.  Risk Prescription drug  management.   This patient presents to the ED for concern of double vision/eye pain, this involves an extensive number of treatment options, and is a complaint that carries with it a high risk of complications and morbidity.  The differential diagnosis includes stroke, glaucoma, periorbital cellulitis, orbital cellulitis, globe perforation, foreign body retention, conjunctivitis bacterial/viral, corneal abrasion/ulcer   Co morbidities that complicate the patient evaluation  See HPI   Additional  history obtained:  Additional history obtained from EMR   Lab Tests:  I Ordered, and personally interpreted labs.  The pertinent results include: Electrolyte abnormality noted.  Renal function within normal limits.  No transaminitis noted.  No leukocytosis.  No evidence of anemia.  Platelets within normal range.   Imaging Studies ordered:  MRI of orbits and brain considered but needed necessary due to this exam findings concerning for stroke.   Cardiac Monitoring: / EKG:  The patient was maintained on a cardiac monitor.  I personally viewed and interpreted the cardiac monitored which showed an underlying rhythm of: See above   Consultations Obtained:  N/a   Problem List / ED Course / Critical interventions / Medication management  Eye pain/double vision I ordered medication including fluorescein ophthalmic strip as well as tetracaine for her eye exam.   Reevaluation of the patient after these medicines showed that the patient improved I have reviewed the patients home medicines and have made adjustments as needed   Social Determinants of Health:  Denies tobacco, illicit drug use.   Test / Admission - Considered:  Vitals signs within normal range and stable throughout visit. Laboratory studies significant for: See above Patient symptoms likely not related to CVA, acute glaucoma, orbital/periorbital cellulitis, globe perforation, corneal abrasion/ulcer.  Recommend continued  therapy at home with oral antihistamine as well as Decadron drops awaiting optometrist evaluation tomorrow.  No further evaluation deemed necessary while in emergency department.  Treatment plan was discussed at length with patient and wife ending understanding agreeable to said plan. Worrisome signs and symptoms were discussed with the patient, and the patient acknowledged understanding to return to the ED if noticed. Patient was stable upon discharge.         Final Clinical Impression(s) / ED Diagnoses Final diagnoses:  Left eye pain    Rx / DC Orders ED Discharge Orders     None         Wilnette Kales, [place] 11/28/21 1353    Margette Fast, MD 12/01/21 [phone]

## 2021-11-28 NOTE — Discharge Instructions (Signed)
Note your visit to the emergency department today was overall reassuring.  Continue at home therapy as we discussed.  Keep your upcoming optometrist appointment tomorrow for reevaluation of your symptoms.  Please do not hesitate to return to the emergency department if the worrisome signs and symptoms we discussed become apparent.

## 2021-11-28 NOTE — ED Triage Notes (Addendum)
Patient reports that he began using Nasacort for nasal congestion 2 weeks ago..  Patient states he has been having eye swelling and pain over the past 2 weeks. Patient has been to [facility] walk in clinic and had Decadron. Patient states that he was tested for Lyme disease and covid yesterday.   Today, the patient states he has been having double vision and worse redness noted today.

## 2021-12-05 DIAGNOSIS — H43813 Vitreous degeneration, bilateral: Secondary | ICD-10-CM | POA: Diagnosis not present

## 2021-12-05 DIAGNOSIS — H532 Diplopia: Secondary | ICD-10-CM | POA: Diagnosis not present

## 2021-12-05 DIAGNOSIS — H2513 Age-related nuclear cataract, bilateral: Secondary | ICD-10-CM | POA: Diagnosis not present

## 2021-12-05 DIAGNOSIS — H53122 Transient visual loss, left eye: Secondary | ICD-10-CM | POA: Diagnosis not present

## 2021-12-05 DIAGNOSIS — R519 Headache, unspecified: Secondary | ICD-10-CM | POA: Diagnosis not present

## 2021-12-06 ENCOUNTER — Other Ambulatory Visit (HOSPITAL_COMMUNITY): Payer: Self-pay | Admitting: Optometry

## 2021-12-06 ENCOUNTER — Emergency Department (HOSPITAL_COMMUNITY): Payer: Medicare Other

## 2021-12-06 ENCOUNTER — Ambulatory Visit (INDEPENDENT_AMBULATORY_CARE_PROVIDER_SITE_OTHER): Payer: Medicare Other | Admitting: Cardiology

## 2021-12-06 ENCOUNTER — Other Ambulatory Visit: Payer: Self-pay

## 2021-12-06 ENCOUNTER — Ambulatory Visit (HOSPITAL_COMMUNITY): Payer: No Typology Code available for payment source

## 2021-12-06 ENCOUNTER — Encounter (HOSPITAL_COMMUNITY): Payer: Self-pay | Admitting: Emergency Medicine

## 2021-12-06 ENCOUNTER — Encounter: Payer: Self-pay | Admitting: Cardiology

## 2021-12-06 ENCOUNTER — Other Ambulatory Visit: Payer: Self-pay | Admitting: Optometry

## 2021-12-06 ENCOUNTER — Observation Stay (HOSPITAL_COMMUNITY): Payer: Medicare Other

## 2021-12-06 ENCOUNTER — Inpatient Hospital Stay (HOSPITAL_COMMUNITY)
Admission: EM | Admit: 2021-12-06 | Discharge: 2021-12-09 | DRG: 516 | Disposition: A | Discharge status: Home / Self Care | Payer: Medicare Other | Attending: Family Medicine | Admitting: Family Medicine

## 2021-12-06 ENCOUNTER — Encounter (HOSPITAL_COMMUNITY): Payer: Self-pay

## 2021-12-06 VITALS — BP 110/80 | HR 95 | Ht 75.0 in | Wt 202.0 lb

## 2021-12-06 DIAGNOSIS — G453 Amaurosis fugax: Secondary | ICD-10-CM | POA: Diagnosis present

## 2021-12-06 DIAGNOSIS — Z0389 Encounter for observation for other suspected diseases and conditions ruled out: Secondary | ICD-10-CM | POA: Diagnosis not present

## 2021-12-06 DIAGNOSIS — R519 Headache, unspecified: Secondary | ICD-10-CM | POA: Diagnosis not present

## 2021-12-06 DIAGNOSIS — I10 Essential (primary) hypertension: Secondary | ICD-10-CM | POA: Diagnosis present

## 2021-12-06 DIAGNOSIS — G588 Other specified mononeuropathies: Secondary | ICD-10-CM | POA: Diagnosis present

## 2021-12-06 DIAGNOSIS — H5462 Unqualified visual loss, left eye, normal vision right eye: Secondary | ICD-10-CM | POA: Diagnosis not present

## 2021-12-06 DIAGNOSIS — G9389 Other specified disorders of brain: Secondary | ICD-10-CM | POA: Diagnosis not present

## 2021-12-06 DIAGNOSIS — Z7401 Bed confinement status: Secondary | ICD-10-CM

## 2021-12-06 DIAGNOSIS — Z79899 Other long term (current) drug therapy: Secondary | ICD-10-CM | POA: Diagnosis not present

## 2021-12-06 DIAGNOSIS — Z8546 Personal history of malignant neoplasm of prostate: Secondary | ICD-10-CM | POA: Diagnosis not present

## 2021-12-06 DIAGNOSIS — H538 Other visual disturbances: Secondary | ICD-10-CM | POA: Diagnosis not present

## 2021-12-06 DIAGNOSIS — Z9079 Acquired absence of other genital organ(s): Secondary | ICD-10-CM | POA: Diagnosis not present

## 2021-12-06 DIAGNOSIS — I4891 Unspecified atrial fibrillation: Secondary | ICD-10-CM | POA: Diagnosis not present

## 2021-12-06 DIAGNOSIS — I4821 Permanent atrial fibrillation: Secondary | ICD-10-CM | POA: Diagnosis not present

## 2021-12-06 DIAGNOSIS — H532 Diplopia: Secondary | ICD-10-CM

## 2021-12-06 DIAGNOSIS — E785 Hyperlipidemia, unspecified: Secondary | ICD-10-CM | POA: Insufficient documentation

## 2021-12-06 DIAGNOSIS — Z20822 Contact with and (suspected) exposure to covid-19: Secondary | ICD-10-CM | POA: Diagnosis present

## 2021-12-06 DIAGNOSIS — G500 Trigeminal neuralgia: Secondary | ICD-10-CM | POA: Diagnosis not present

## 2021-12-06 DIAGNOSIS — I4819 Other persistent atrial fibrillation: Secondary | ICD-10-CM | POA: Diagnosis present

## 2021-12-06 DIAGNOSIS — H469 Unspecified optic neuritis: Secondary | ICD-10-CM

## 2021-12-06 DIAGNOSIS — D6869 Other thrombophilia: Secondary | ICD-10-CM | POA: Diagnosis present

## 2021-12-06 DIAGNOSIS — R739 Hyperglycemia, unspecified: Secondary | ICD-10-CM | POA: Diagnosis not present

## 2021-12-06 DIAGNOSIS — Z91018 Allergy to other foods: Secondary | ICD-10-CM

## 2021-12-06 DIAGNOSIS — Z8611 Personal history of tuberculosis: Secondary | ICD-10-CM

## 2021-12-06 DIAGNOSIS — R509 Fever, unspecified: Secondary | ICD-10-CM | POA: Diagnosis not present

## 2021-12-06 DIAGNOSIS — E7800 Pure hypercholesterolemia, unspecified: Secondary | ICD-10-CM | POA: Diagnosis present

## 2021-12-06 DIAGNOSIS — Z888 Allergy status to other drugs, medicaments and biological substances status: Secondary | ICD-10-CM

## 2021-12-06 DIAGNOSIS — T380X5A Adverse effect of glucocorticoids and synthetic analogues, initial encounter: Secondary | ICD-10-CM | POA: Diagnosis not present

## 2021-12-06 DIAGNOSIS — Z981 Arthrodesis status: Secondary | ICD-10-CM | POA: Diagnosis not present

## 2021-12-06 DIAGNOSIS — Z7901 Long term (current) use of anticoagulants: Secondary | ICD-10-CM

## 2021-12-06 DIAGNOSIS — H547 Unspecified visual loss: Secondary | ICD-10-CM | POA: Diagnosis not present

## 2021-12-06 DIAGNOSIS — Z8673 Personal history of transient ischemic attack (TIA), and cerebral infarction without residual deficits: Secondary | ICD-10-CM

## 2021-12-06 DIAGNOSIS — H539 Unspecified visual disturbance: Secondary | ICD-10-CM | POA: Diagnosis not present

## 2021-12-06 DIAGNOSIS — R9389 Abnormal findings on diagnostic imaging of other specified body structures: Secondary | ICD-10-CM | POA: Diagnosis not present

## 2021-12-06 DIAGNOSIS — M316 Other giant cell arteritis: Principal | ICD-10-CM

## 2021-12-06 LAB — CBC WITH DIFFERENTIAL/PLATELET
Abs Immature Granulocytes: 0.03 10*3/uL (ref 0.00–0.07)
Basophils Absolute: 0.1 10*3/uL (ref 0.0–0.1)
Basophils Relative: 1 %
Eosinophils Absolute: 0.1 10*3/uL (ref 0.0–0.5)
Eosinophils Relative: 1 %
HCT: 52.1 % — ABNORMAL HIGH (ref 39.0–52.0)
Hemoglobin: 17.4 g/dL — ABNORMAL HIGH (ref 13.0–17.0)
Immature Granulocytes: 0 %
Lymphocytes Relative: 19 %
Lymphs Abs: 1.8 10*3/uL (ref 0.7–4.0)
MCH: 30.8 pg (ref 26.0–34.0)
MCHC: 33.4 g/dL (ref 30.0–36.0)
MCV: 92.2 fL (ref 80.0–100.0)
Monocytes Absolute: 0.9 10*3/uL (ref 0.1–1.0)
Monocytes Relative: 10 %
Neutro Abs: 6.5 10*3/uL (ref 1.7–7.7)
Neutrophils Relative %: 69 %
Platelets: 277 10*3/uL (ref 150–400)
RBC: 5.65 MIL/uL (ref 4.22–5.81)
RDW: 12.6 % (ref 11.5–15.5)
WBC: 9.3 10*3/uL (ref 4.0–10.5)
nRBC: 0 % (ref 0.0–0.2)

## 2021-12-06 LAB — COMPREHENSIVE METABOLIC PANEL
ALT: 25 U/L (ref 0–44)
AST: 22 U/L (ref 15–41)
Albumin: 4.2 g/dL (ref 3.5–5.0)
Alkaline Phosphatase: 94 U/L (ref 38–126)
Anion gap: 10 (ref 5–15)
BUN: 16 mg/dL (ref 8–23)
CO2: 25 mmol/L (ref 22–32)
Calcium: 9.8 mg/dL (ref 8.9–10.3)
Chloride: 105 mmol/L (ref 98–111)
Creatinine, Ser: 1.02 mg/dL (ref 0.61–1.24)
GFR, Estimated: >60 mL/min (ref >60)
Glucose, Bld: 98 mg/dL (ref 70–99)
Potassium: 4.6 mmol/L (ref 3.5–5.1)
Sodium: 140 mmol/L (ref 135–145)
Total Bilirubin: 1.3 mg/dL — ABNORMAL HIGH (ref 0.3–1.2)
Total Protein: 7.7 g/dL (ref 6.5–8.1)

## 2021-12-06 LAB — SEDIMENTATION RATE: Sed Rate: 4 mm/hr (ref 0–16)

## 2021-12-06 LAB — C-REACTIVE PROTEIN: CRP: <0.5 mg/dL (ref <1.0)

## 2021-12-06 MED ORDER — SODIUM CHLORIDE 0.9 % IV SOLN
500.0000 mg | Freq: Once | INTRAVENOUS | Status: AC
  Administered 2021-12-06: 500 mg via INTRAVENOUS
  Filled 2021-12-06: qty 4

## 2021-12-06 MED ORDER — GADOBUTROL 1 MMOL/ML IV SOLN
9.0000 mL | Freq: Once | INTRAVENOUS | Status: AC | PRN
  Administered 2021-12-06: 9 mL via INTRAVENOUS

## 2021-12-06 MED ORDER — ACETAMINOPHEN 325 MG PO TABS: 650.0000 mg | ORAL_TABLET | Freq: Four times a day (QID) | ORAL | Status: DC | PRN

## 2021-12-06 MED ORDER — IOHEXOL 350 MG/ML SOLN
75.0000 mL | Freq: Once | INTRAVENOUS | Status: AC | PRN
  Administered 2021-12-06: 75 mL via INTRAVENOUS

## 2021-12-06 MED ORDER — ONDANSETRON HCL 4 MG/2ML IJ SOLN: 4.0000 mg | Freq: Four times a day (QID) | INTRAMUSCULAR | Status: DC | PRN

## 2021-12-06 MED ORDER — APIXABAN 5 MG PO TABS: 5.0000 mg | ORAL_TABLET | Freq: Two times a day (BID) | ORAL | Status: DC

## 2021-12-06 MED ORDER — ONDANSETRON HCL 4 MG PO TABS: 4.0000 mg | ORAL_TABLET | Freq: Four times a day (QID) | ORAL | Status: DC | PRN

## 2021-12-06 MED ORDER — ROSUVASTATIN CALCIUM 20 MG PO TABS
20.0000 mg | ORAL_TABLET | Freq: Every day | ORAL | Status: DC
  Administered 2021-12-06 – 2021-12-09 (×4): 20 mg via ORAL
  Filled 2021-12-06 (×4): qty 1

## 2021-12-06 MED ORDER — SODIUM CHLORIDE 0.9 % IV SOLN
500.0000 mg | INTRAVENOUS | Status: DC
  Administered 2021-12-06: 500 mg via INTRAVENOUS
  Filled 2021-12-06: qty 8
  Filled 2021-12-06: qty 4

## 2021-12-06 MED ORDER — PANTOPRAZOLE SODIUM 40 MG PO TBEC
40.0000 mg | DELAYED_RELEASE_TABLET | Freq: Every day | ORAL | Status: DC
  Administered 2021-12-06: 40 mg via ORAL
  Filled 2021-12-06: qty 1

## 2021-12-06 MED ORDER — PREDNISONE 20 MG PO TABS
60.0000 mg | ORAL_TABLET | Freq: Every day | ORAL | Status: DC
  Administered 2021-12-06: 60 mg via ORAL
  Filled 2021-12-06: qty 3

## 2021-12-06 MED ORDER — ACETAMINOPHEN 650 MG RE SUPP: 650.0000 mg | Freq: Four times a day (QID) | RECTAL | Status: DC | PRN

## 2021-12-06 MED ORDER — DILTIAZEM HCL ER COATED BEADS 120 MG PO CP24
120.0000 mg | ORAL_CAPSULE | Freq: Two times a day (BID) | ORAL | Status: DC
  Administered 2021-12-06 – 2021-12-09 (×6): 120 mg via ORAL
  Filled 2021-12-06 (×7): qty 1

## 2021-12-06 NOTE — ED Notes (Signed)
Pt had 10 min episode where left eye vision went out. Pt reports his vision is back to normal at this moment.

## 2021-12-06 NOTE — ED Provider Triage Note (Signed)
Emergency Medicine Provider Triage Evaluation Note  Patrick Salinas , a 71 y.o. male  was evaluated in triage.  Pt complains of intermitteent diplopia and visual loss in his left eye. Reports that earlier this morning he noted "gray clouds" in his right eye that resolved. He was seen at ophthamology already with benign eye exam. CRP was negative.  Review of Systems  Positive:  Negative:   Physical Exam  BP (!) 129/102 (BP Location: Left Arm)   Pulse 84   Temp 97.8 F (36.6 C) (Oral)   Resp 18   SpO2 98%  Gen:   Awake, no distress   Resp:  Normal effort  MSK:   Moves extremities without difficulty  Other:  PERRL, normal speech  Medical Decision Making  Medically screening exam initiated at 11:35 AM.  Appropriate orders placed.  Patrick Salinas was informed that the remainder of the evaluation will be completed by another provider, this initial triage assessment does not replace that evaluation, and the importance of remaining in the ED until their evaluation is complete.  Called and spoke with Dr. Curly Shores with neuro. She recommends starting off with a CTA of the head and neck and moving on to MRI with and without contrast.    Patrick Puller, PA-C 12/06/21 1137

## 2021-12-06 NOTE — Consult Note (Incomplete)
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 Patient states that his eye problems began about two to three months ago when he began having rhinitis and watery eyes. He treated this with OTC allergy medications.  About two weeks ago, he had an episode of chemosis in the right eye as well as diplopia and a headache that felt like a band constricting around his forehead.  On Saturday 9/9, he presented to urgent care with these symptoms and was tested for covid-19 and Lyme disease and was given some dexamethasone eye drops. All of his test for infectious diseases turned out to be negative.  On Sunday, he continued to have diplopia and saw an ophthalmologist who checked his intraocular pressures and planned for patient to have a MRI of his head. After this, he had an episode of the vision in his left eye going black which lasted about 15-20 minutes and resolved spontaneously.  This morning, he had another episode of the vision in his left eye going black, then fading to grey clouds and becoming clear.  A little while after this, he had a similar episode of vision loss in the right eye which recovered spontaneously.  After this, he had a BM and when he performed the valsalva maneuver lost vision in his left eye again.  This recovered spontaneously and he presented to the ED. No jaw claudication, has been having low grade fevers with significant weight loss. ESR and CRP here are normal.  On my  discussion with him, he reports that ophthalmology team is worried about temporal arteritis despite ESR and CRP negative and directed him to the ED for MRI and Temporal artery biopsy. MRI brain and orbits with no optic nerve enhancement but notable for irregular enhancement of the intraconal fat around the optic nerves bilaterally with associated enhancement of the extraocular muscles which remain with normal volume. Findings may be seen the setting of idiopathic orbital inflammation. CT Venogram with no dural sinus venous thrombosis.  He reports that in the evening, he had another episode of L eye mono-occular vision loss for 10 mins and was given IV Solumedrol 554m once.   ROS   Constitutional + weight loss, fever but no chills.   HEENT + changes in vision with intact hearing.  Respiratory Denies SOB and cough.  CV Denies palpitations and CP  GI Denies abdominal pain, nausea, vomiting and diarrhea.  GU Denies dysuria and urinary frequency.  MSK Denies myalgia and joint pain.  Skin Denies rash and pruritus.  Neurological A band like headache extending across forehead and both temples. no syncope.  Psychiatric Denies recent changes in mood. Denies anxiety and depression.   Past History   Past Medical History:  Diagnosis Date  . Cancer ([facility]    Prostate cancer -dx 5'16  . Hypercholesterolemia    no meds due to adverse reaction   Past Surgical History:  Procedure Laterality  Date  . BIOPSY PROSTATE     5'16  . CERVICAL FUSION    . COLONOSCOPY W/ POLYPECTOMY     x2 benign  . IR CT HEAD LTD  05/17/2018  . IR PERCUTANEOUS ART THROMBECTOMY/INFUSION INTRACRANIAL INC DIAG ANGIO  05/17/2018  . IR US GUIDE VASC ACCESS RIGHT  05/17/2018  . RADIOLOGY WITH ANESTHESIA N/A 05/17/2018   Procedure: IR WITH ANESTHESIA;  Surgeon: Corrie Mckusick, DO;  Location: [facility];  Service: Anesthesiology;  Laterality: N/A;  . ROBOT ASSISTED LAPAROSCOPIC RADICAL PROSTATECTOMY N/A 11/02/2014   Procedure: ROBOTIC  ASSISTED LAPAROSCOPIC RADICAL PROSTATECTOMY LEVEL 1;  Surgeon: Raynelle Bring, MD;  Location: [facility];  Service: Urology;  Laterality: N/A;  . TONSILLECTOMY     History reviewed. No pertinent family history. Social History   Socioeconomic History  . Marital status: Married    Spouse name: Not on file  . Number of children: Not on file  . Years of education: Not on file  . Highest education level: Not on file  Occupational History  . Not on file  Tobacco Use  . Smoking status: Never  . Smokeless tobacco: Never  Vaping Use  . Vaping Use: Never used  Substance and Sexual Activity  . Alcohol use: Yes    Comment: one drink daily  . Drug use: No  . Sexual activity: Not on file  Other Topics Concern  . Not on file  Social History Narrative  . Not on file   Social Determinants of Health   Financial Resource Strain: Not on file  Food Insecurity: Not on file  Transportation Needs: Not on file  Physical Activity: Not on file  Stress: Not on file  Social Connections: Not on file   Allergies  Allergen Reactions  . Hctz [Hydrochlorothiazide] Other (See Comments)    Myalgia/ raises blood sugar  . Lipitor [Atorvastatin Calcium] Other (See Comments)    Myalgia/ raises blood sugar  . Pineapple Swelling    Tongue swelling    Medications   Medications Prior to Admission  Medication Sig Dispense Refill Last Dose  . apixaban (ELIQUIS) 5 MG TABS tablet Take 1 tablet (5 mg total) by mouth 2 (two) times daily. 60 tablet 2 12/06/2021 at 05:30  . diltiazem (CARDIZEM CD) 120 MG 24 hr capsule Take 1 capsule (120 mg total) by mouth 2 (two) times daily. 180 capsule 2 12/06/2021  . loratadine (CLARITIN) 10 MG tablet Take 10 mg by mouth daily.   Past Month  . oxymetazoline (AFRIN) 0.05 % nasal spray Place 1 spray into both nostrils daily as needed for congestion.   Past Month  . rosuvastatin (CRESTOR) 20 MG tablet TAKE 1 TABLET BY MOUTH DAILY AT 6 PM. (Patient taking differently: Take 20 mg by  mouth daily at 6 PM.) 30 tablet 0 12/05/2021  . Triamcinolone Acetonide (NASACORT ALLERGY 24HR NA) Place 1 spray into the nose daily.   Past Month     Vitals   Vitals:   12/06/21 2114 12/06/21 2130 12/06/21 2145 12/06/21 2220  BP:   (!) 151/100 (!) 136/98  Pulse:  88  (!) 104  Resp:  19    Temp: 98.9 F (37.2 C)   98.8 F (37.1 C)  TempSrc: Oral   Oral  SpO2:  97%  98%  Weight:      Height:         Body mass index is 25.25 kg/m.  Physical Exam   General: Laying comfortably in bed; in no acute distress.  HENT: Normal oropharynx and mucosa. Normal external appearance of ears and nose.  Neck: Supple, no pain or tenderness  CV: No JVD. No peripheral edema.  Pulmonary: Symmetric Chest rise. Normal respiratory effort.  Abdomen: Soft to touch, non-tender.  Ext: No cyanosis, edema, or deformity  Skin: No rash. Normal palpation of skin.   Musculoskeletal: Normal digits and nails by inspection. No clubbing.   Neurologic Examination  Mental status/Cognition: Alert, oriented to self, place, month and year, good attention.  Speech/language: Fluent, comprehension intact, object naming intact, repetition intact.  Cranial nerves:   CN II Pupils equal and reactive to light, no VF deficits    CN III,IV,VI EOM intact, no gaze preference or deviation, no nystagmus    CN V normal sensation in V1, V2, and V3 segments bilaterally    CN VII no asymmetry, no nasolabial fold flattening    CN VIII normal hearing to speech    CN IX & X normal palatal elevation, no uvular deviation    CN XI 5/5 head turn and 5/5 shoulder shrug bilaterally    CN XII midline tongue protrusion    Motor:  Muscle bulk: normal, tone normal, pronator drift none tremor none Mvmt Root Nerve  Muscle Right Left Comments  SA C5/6 Ax Deltoid 5 5   EF C5/6 Mc Biceps 5 5   EE C6/7/8 Rad Triceps 5 5   WF C6/7 Med FCR     WE C7/8 PIN ECU     F Ab C8/T1 U ADM/FDI 5 5   HF L1/2/3 Fem Illopsoas 5 5   KE L2/3/4 Fem Quad 5 5    DF L4/5 D Peron Tib Ant 5 5   PF S1/2 Tibial Grc/Sol 5 5    Reflexes:  Right Left Comments  Pectoralis      Biceps (C5/6) 2 2   Brachioradialis (C5/6) 2 2    Triceps (C6/7) 2 2    Patellar (L3/4) 2 2    Achilles (S1)      Hoffman      Plantar     Jaw jerk    Sensation:  Light touch Intact throughout   Pin prick    Temperature    Vibration   Proprioception    Coordination/Complex Motor:  - Finger to Nose intact BL - Heel to shin intact BL - Rapid alternating movement are normal - Gait: Deferred.  Labs   CBC:  Recent Labs  Lab 12/06/21 1201  WBC 9.3  NEUTROABS 6.5  HGB 17.4*  HCT 52.1*  MCV 92.2  PLT 389    Basic Metabolic Panel:  Lab Results  Component Value Date   NA 140 12/06/2021   K 4.6 12/06/2021   CO2 25 12/06/2021   GLUCOSE 98 12/06/2021   BUN 16 12/06/2021   CREATININE 1.02 12/06/2021   CALCIUM 9.8 12/06/2021   GFRNONAA >60 12/06/2021   GFRAA >60 04/06/2019   Lipid Panel:  Lab Results  Component Value Date   LDLCALC 154 (H) 05/18/2018   HgbA1c:  Lab Results  Component Value Date   HGBA1C 5.7 (H) 05/18/2018   Urine Drug Screen: No results found for: "LABOPIA", "COCAINSCRNUR", "LABBENZ", "AMPHETMU", "THCU", "LABBARB"  Alcohol Level No results found for: "ETH"  CT Angio Head and neck with and without contrast(Personally reviewed): CTH was negative for a large hypodensity concerning for a large territory infarct or hyperdensity concerning for an ICH. No LVO, no significant stenosis.  CT Venogram head(Personally reviewed): No dural sinus venous thrombosis.  MR Brain with and without contrast(Personally reviewed): No acute intracranial abnormalities.  MRI orbits(Personally reviewed): 1. Ill defined, irregular enhancement of the intraconal fat around the optic nerves bilaterally with associated enhancement of the extraocular muscles which remain with normal volume. Findings may be seen the setting of idiopathic orbital  inflammation. 2. A 3 mm focus of contrast enhancement within the left internal auditory canal fundus, may represent a small vestibular schwannoma. 3. Remote right MCA territory infarcts.  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 Clinical presentation is not classic for GCA but certainly still on the differential given reported fatigue, weightloss, fever, transient/brief vision loss and blurry vision. Still his ESR and CRP are normal, no jaw claudication, no muscle aches concerning for PMR, no scalp tenderness. His MRI Brain is ne  Primary Diagnosis:  {Cerebral Infarction:22351}  Secondary Diagnosis: {Stroke Comorbidities:21266}  Recommendations  *** ______________________________________________________________________   Thank you for the opportunity to take part in the care of this patient. If you have any further questions, please contact the neurology consultation attending.  Signed,  [place] Pager Number [phone] _ _ _   _ __   _ __ _ _  __ __   _ __   __ _

## 2021-12-06 NOTE — Patient Instructions (Signed)
Medication Instructions:  Your physician recommends that you continue on your current medications as directed. Please refer to the Current Medication list given to you today.  *If you need a refill on your cardiac medications before your next appointment, please call your pharmacy*  Testing/Procedures: Carotid US Your physician has requested that you have a carotid duplex. This test is an ultrasound of the carotid arteries in your neck. It looks at blood flow through these arteries that supply the brain with blood. Allow one hour for this exam. There are no restrictions or special instructions.    Follow-Up: At [facility], you and your health needs are our priority.  As part of our continuing mission to provide you with exceptional heart care, we have created designated Provider Care Teams.  These Care Teams include your primary Cardiologist (physician) and Advanced Practice Providers (APPs -  Physician Assistants and Nurse Practitioners) who all work together to provide you with the care you need, when you need it.   Your next appointment:   6 month(s)  The format for your next appointment:   In Person  Provider:   Candee Furbish, MD

## 2021-12-06 NOTE — Consult Note (Addendum)
ED Consult    Reason for Consult:  temporal artery biopsy Referring Physician:  Dr. Francine Graven MRN #:  [id number]  History of Present Illness: This is a 71 y.o. male with history of right MCA stroke underwent percutaneous thrombectomy.  Now presents with left vision loss which has been worsening of the past 3-4 weeks.  He now is also experiencing right sided vision changes.  He does take Eliquis daily.   Past Medical History:  Diagnosis Date   Cancer [facility])    Prostate cancer -dx 5'16   Hypercholesterolemia    no meds due to adverse reaction    Past Surgical History:  Procedure Laterality Date   BIOPSY PROSTATE     5'16   CERVICAL FUSION     COLONOSCOPY W/ POLYPECTOMY     x2 benign   IR CT HEAD LTD  05/17/2018   IR PERCUTANEOUS ART THROMBECTOMY/INFUSION INTRACRANIAL INC DIAG ANGIO  05/17/2018   IR US GUIDE VASC ACCESS RIGHT  05/17/2018   RADIOLOGY WITH ANESTHESIA N/A 05/17/2018   Procedure: IR WITH ANESTHESIA;  Surgeon: Corrie Mckusick, DO;  Location: [place];  Service: Anesthesiology;  Laterality: N/A;   ROBOT ASSISTED LAPAROSCOPIC RADICAL PROSTATECTOMY N/A 11/02/2014   Procedure: ROBOTIC ASSISTED LAPAROSCOPIC RADICAL PROSTATECTOMY LEVEL 1;  Surgeon: Raynelle Bring, MD;  Location: [facility];  Service: Urology;  Laterality: N/A;   TONSILLECTOMY      Allergies  Allergen Reactions   Hctz [Hydrochlorothiazide] Other (See Comments)    Myalgia/ raises blood sugar   Lipitor [Atorvastatin Calcium] Other (See Comments)    Myalgia/ raises blood sugar   Pineapple Swelling    Tongue swelling    Prior to Admission medications   Medication Sig Start Date End Date Taking? Authorizing Provider  apixaban (ELIQUIS) 5 MG TABS tablet Take 1 tablet (5 mg total) by mouth 2 (two) times daily. 05/20/18  Yes Donzetta Starch, NP  diltiazem (CARDIZEM CD) 120 MG 24 hr capsule Take 1 capsule (120 mg total) by mouth 2 (two) times daily. 12/17/20  Yes Fenton, Clint R, PA  loratadine (CLARITIN) 10 MG tablet Take 10 mg  by mouth daily.   Yes [provider]  oxymetazoline (AFRIN) 0.05 % nasal spray Place 1 spray into both nostrils daily as needed for congestion.   Yes [provider]  rosuvastatin (CRESTOR) 20 MG tablet TAKE 1 TABLET BY MOUTH DAILY AT 6 PM. Patient taking differently: Take 20 mg by mouth daily at 6 PM. 10/03/21  Yes Skains, Thana Farr, MD  Triamcinolone Acetonide (NASACORT ALLERGY 24HR NA) Place 1 spray into the nose daily.   Yes [provider]    Social History   Socioeconomic History   Marital status: Married    Spouse name: Not on file   Number of children: Not on file   Years of education: Not on file   Highest education level: Not on file  Occupational History   Not on file  Tobacco Use   Smoking status: Never   Smokeless tobacco: Never  Vaping Use   Vaping Use: Never used  Substance and Sexual Activity   Alcohol use: Yes    Comment: one drink daily   Drug use: No   Sexual activity: Not on file  Other Topics Concern   Not on file  Social History Narrative   Not on file   Social Determinants of Health   Financial Resource Strain: Not on file  Food Insecurity: Not on file  Transportation Needs: Not on file  Physical Activity: Not on file  Stress: Not on file  Social Connections: Not on file  Intimate Partner Violence: Not on file    No family history on file.  Review of Systems  Constitutional: Negative.   HENT: Negative.    Eyes:        Vision loss left eye  Respiratory: Negative.    Cardiovascular: Negative.   Gastrointestinal: Negative.   Musculoskeletal: Negative.   Skin: Negative.   Neurological: Negative.   Endo/Heme/Allergies: Negative.   Psychiatric/Behavioral: Negative.        Physical Examination  Vitals:   12/06/21 1400 12/06/21 1430  BP: (!) 144/95 (!) 134/96  Pulse: 85 86  Resp: 19 (!) 24  Temp:    SpO2: 96% 99%   There is no height or weight on file to calculate BMI.  Physical Exam HENT:     Head:  Normocephalic.     Nose: Nose normal.  Eyes:     Pupils: Pupils are equal, round, and reactive to light.  Cardiovascular:     Rate and Rhythm: Normal rate.     Pulses: Normal pulses.  Pulmonary:     Effort: Pulmonary effort is normal.  Abdominal:     General: Abdomen is flat.     Palpations: Abdomen is soft.  Musculoskeletal:        General: Normal range of motion.     Cervical back: Normal range of motion.     Right lower leg: No edema.     Left lower leg: No edema.  Skin:    General: Skin is warm and dry.     Capillary Refill: Capillary refill takes less than 2 seconds.  Neurological:     General: No focal deficit present.     Mental Status: He is alert.  Psychiatric:        Mood and Affect: Mood normal.        Behavior: Behavior normal.        Thought Content: Thought content normal.        Judgment: Judgment normal.      CBC    Component Value Date/Time   WBC 9.3 12/06/2021 1201   RBC 5.65 12/06/2021 1201   HGB 17.4 (H) 12/06/2021 1201   HCT 52.1 (H) 12/06/2021 1201   PLT 277 12/06/2021 1201   MCV 92.2 12/06/2021 1201   MCH 30.8 12/06/2021 1201   MCHC 33.4 12/06/2021 1201   RDW 12.6 12/06/2021 1201   LYMPHSABS 1.8 12/06/2021 1201   MONOABS 0.9 12/06/2021 1201   EOSABS 0.1 12/06/2021 1201   BASOSABS 0.1 12/06/2021 1201    BMET    Component Value Date/Time   NA 140 12/06/2021 1201   K 4.6 12/06/2021 1201   CL 105 12/06/2021 1201   CO2 25 12/06/2021 1201   GLUCOSE 98 12/06/2021 1201   BUN 16 12/06/2021 1201   CREATININE 1.02 12/06/2021 1201   CALCIUM 9.8 12/06/2021 1201   GFRNONAA >60 12/06/2021 1201   GFRAA >60 04/06/2019 1612    COAGS: Lab Results  Component Value Date   INR 1.06 05/17/2018     Non-Invasive Vascular Imaging:   MRI IMPRESSION: 1. Ill defined, irregular enhancement of the intraconal fat around the optic nerves bilaterally with associated enhancement of the extraocular muscles which remain with normal volume. Findings may  be seen the setting of idiopathic orbital inflammation. 2. A 3 mm focus of contrast enhancement within the left internal auditory canal fundus, may represent a small vestibular schwannoma.  3. Remote right MCA territory infarcts.  ASSESSMENT/PLAN: This is a 71 y.o. male who presents to the ER with vision changes of both eyes, left worse than right   -VVS consulted for temporal artery biopsy -Hold Eliquis; we will allow 48 hours for washout -Plan will be for left temporal artery biopsy in the OR as early as Thursday.  Case was discussed with the patient and his wife and they agree to proceed -Dr. Donzetta Matters will evaluate the patient later today and provide further treatment plans    Dagoberto Ligas, PA-C [facility] [phone] 12/06/2021  5:27 PM  I have independently interviewed and examined patient and agree with PA assessment and plan above.  We will hold on temporal artery biopsy until neurology evaluation but could be performed as early as Thursday as long as Eliquis has been held.  Khadeja Abt C. Donzetta Matters, MD [facility] [place] Office: [phone] Pager: [phone]

## 2021-12-06 NOTE — ED Notes (Signed)
Patient transported to MRI 

## 2021-12-06 NOTE — Consult Note (Signed)
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 Patient states that his eye problems began about two to three months ago when he began having rhinitis and watery eyes. He treated this with OTC allergy medications.  About two weeks ago, he had an episode of chemosis in the right eye as well as diplopia and a headache that felt like a band constricting around his forehead.  On Saturday 9/9, he presented to urgent care with these symptoms and was tested for covid-19 and Lyme disease and was given some dexamethasone eye drops. All of his test for infectious diseases turned out to be negative.  On Sunday, he continued to have diplopia and saw an ophthalmologist who checked his intraocular pressures and planned for patient to have a MRI of his head. After this, he had an episode of the vision in his left eye going black which lasted about 15-20 minutes and resolved spontaneously.  This morning, he had another episode of the vision in his left eye going black, then fading to grey clouds and becoming clear.  A little while after this, he had a similar episode of vision loss in the right eye which recovered spontaneously.  After this, he had a BM and when he performed the valsalva maneuver lost vision in his left eye again.  This recovered spontaneously and he presented to the ED. No jaw claudication, has been having low grade fevers with significant weight loss. ESR and CRP here are normal.  On my  discussion with him, he reports that ophthalmology team is worried about temporal arteritis despite ESR and CRP negative and directed him to the ED for MRI and Temporal artery biopsy. MRI brain and orbits with no optic nerve enhancement but notable for irregular enhancement of the intraconal fat around the optic nerves bilaterally with associated enhancement of the extraocular muscles which remain with normal volume. Findings may be seen the setting of idiopathic orbital inflammation. CT Venogram with no dural sinus venous thrombosis.  He reports that in the evening, he had another episode of L eye mono-occular vision loss for 10 mins and was given IV Solumedrol $RemoveBefor'500mg'[id number]$  once.   ROS   Constitutional + weight loss, fever but no chills.   HEENT + changes in vision with intact hearing.  Respiratory Denies SOB and cough.  CV Denies palpitations and CP  GI Denies abdominal pain, nausea, vomiting and diarrhea.  GU Denies dysuria and urinary frequency.  MSK Denies myalgia and joint pain.  Skin Denies rash and pruritus.  Neurological A band like headache extending across forehead and both temples. no syncope.  Psychiatric Denies recent changes in mood. Denies anxiety and depression.   Past History   Past Medical History:  Diagnosis Date   Cancer [facility])    Prostate cancer -dx 5'16   Hypercholesterolemia    no meds due to adverse reaction   Past Surgical History:  Procedure Laterality  Date   BIOPSY PROSTATE     5'16   CERVICAL FUSION     COLONOSCOPY W/ POLYPECTOMY     x2 benign   IR CT HEAD LTD  05/17/2018   IR PERCUTANEOUS ART THROMBECTOMY/INFUSION INTRACRANIAL INC DIAG ANGIO  05/17/2018   IR US GUIDE VASC ACCESS RIGHT  05/17/2018   RADIOLOGY WITH ANESTHESIA N/A 05/17/2018   Procedure: IR WITH ANESTHESIA;  Surgeon: Corrie Mckusick, DO;  Location: [place];  Service: Anesthesiology;  Laterality: N/A;   ROBOT ASSISTED LAPAROSCOPIC RADICAL PROSTATECTOMY N/A 11/02/2014   Procedure: ROBOTIC ASSISTED  LAPAROSCOPIC RADICAL PROSTATECTOMY LEVEL 1;  Surgeon: Raynelle Bring, MD;  Location: [facility];  Service: Urology;  Laterality: N/A;   TONSILLECTOMY     History reviewed. No pertinent family history. Social History   Socioeconomic History   Marital status: Married    Spouse name: Not on file   Number of children: Not on file   Years of education: Not on file   Highest education level: Not on file  Occupational History   Not on file  Tobacco Use   Smoking status: Never   Smokeless tobacco: Never  Vaping Use   Vaping Use: Never used  Substance and Sexual Activity   Alcohol use: Yes    Comment: one drink daily   Drug use: No   Sexual activity: Not on file  Other Topics Concern   Not on file  Social History Narrative   Not on file   Social Determinants of Health   Financial Resource Strain: Not on file  Food Insecurity: Not on file  Transportation Needs: Not on file  Physical Activity: Not on file  Stress: Not on file  Social Connections: Not on file   Allergies  Allergen Reactions   Hctz [Hydrochlorothiazide] Other (See Comments)    Myalgia/ raises blood sugar   Lipitor [Atorvastatin Calcium] Other (See Comments)    Myalgia/ raises blood sugar   Pineapple Swelling    Tongue swelling    Medications   Medications Prior to Admission  Medication Sig Dispense Refill Last Dose   apixaban (ELIQUIS) 5 MG TABS tablet Take 1 tablet (5 mg total) by mouth 2 (two) times daily. 60 tablet 2 12/06/2021 at 05:30   diltiazem (CARDIZEM CD) 120 MG 24 hr capsule Take 1 capsule (120 mg total) by mouth 2 (two) times daily. 180 capsule 2 12/06/2021   loratadine (CLARITIN) 10 MG tablet Take 10 mg by mouth daily.   Past Month   oxymetazoline (AFRIN) 0.05 % nasal spray Place 1 spray into both nostrils daily as needed for congestion.   Past Month   rosuvastatin (CRESTOR) 20 MG tablet TAKE 1 TABLET BY MOUTH DAILY AT 6 PM. (Patient taking differently: Take 20 mg by mouth daily at 6 PM.) 30 tablet 0  12/05/2021   Triamcinolone Acetonide (NASACORT ALLERGY 24HR NA) Place 1 spray into the nose daily.   Past Month     Vitals   Vitals:   12/06/21 2114 12/06/21 2130 12/06/21 2145 12/06/21 2220  BP:   (!) 151/100 (!) 136/98  Pulse:  88  (!) 104  Resp:  19    Temp: 98.9 F (37.2 C)   98.8 F (37.1 C)  TempSrc: Oral   Oral  SpO2:  97%  98%  Weight:      Height:         Body mass index is 25.25 kg/m.  Physical Exam   General: Laying comfortably in bed; in no acute distress.  HENT: Normal oropharynx and mucosa. Normal external appearance of ears and nose.  Neck: Supple, no pain or tenderness  CV: No JVD. No peripheral edema.  Pulmonary: Symmetric Chest rise. Normal respiratory effort.  Abdomen: Soft to touch, non-tender.  Ext: No cyanosis, edema, or deformity  Skin: No rash. Normal palpation of skin.   Musculoskeletal: Normal digits and nails by inspection. No clubbing.   Neurologic Examination  Mental status/Cognition: Alert, oriented to self, place, month and year, good attention.  Speech/language: Fluent, comprehension intact, object naming intact, repetition intact.  Cranial nerves:   CN II Pupils equal and reactive to light, no VF deficits    CN III,IV,VI EOM intact, no gaze preference or deviation, no nystagmus    CN V normal sensation in V1, V2, and V3 segments bilaterally    CN VII no asymmetry, no nasolabial fold flattening    CN VIII normal hearing to speech    CN IX & X normal palatal elevation, no uvular deviation    CN XI 5/5 head turn and 5/5 shoulder shrug bilaterally    CN XII midline tongue protrusion    Motor:  Muscle bulk: normal, tone normal, pronator drift none tremor none Mvmt Root Nerve  Muscle Right Left Comments  SA C5/6 Ax Deltoid 5 5   EF C5/6 Mc Biceps 5 5   EE C6/7/8 Rad Triceps 5 5   WF C6/7 Med FCR     WE C7/8 PIN ECU     F Ab C8/T1 U ADM/FDI 5 5   HF L1/2/3 Fem Illopsoas 5 5   KE L2/3/4 Fem Quad 5 5   DF L4/5 D Peron Tib Ant 5 5   PF  S1/2 Tibial Grc/Sol 5 5    Reflexes:  Right Left Comments  Pectoralis      Biceps (C5/6) 2 2   Brachioradialis (C5/6) 2 2    Triceps (C6/7) 2 2    Patellar (L3/4) 2 2    Achilles (S1)      Hoffman      Plantar     Jaw jerk    Sensation:  Light touch Intact throughout   Pin prick    Temperature    Vibration   Proprioception    Coordination/Complex Motor:  - Finger to Nose intact BL - Heel to shin intact BL - Rapid alternating movement are normal - Gait: Deferred.  Labs   CBC:  Recent Labs  Lab 12/06/21 1201  WBC 9.3  NEUTROABS 6.5  HGB 17.4*  HCT 52.1*  MCV 92.2  PLT 277    Basic Metabolic Panel:  Lab Results  Component Value Date   NA 140 12/06/2021   K 4.6 12/06/2021   CO2 25 12/06/2021   GLUCOSE 98 12/06/2021   BUN 16 12/06/2021   CREATININE 1.02 12/06/2021   CALCIUM 9.8 12/06/2021   GFRNONAA >60 12/06/2021   GFRAA >60 04/06/2019   Lipid Panel:  Lab Results  Component Value Date   LDLCALC 154 (H) 05/18/2018   HgbA1c:  Lab Results  Component Value Date   HGBA1C 5.7 (H) 05/18/2018   Urine Drug Screen: No results found for: "LABOPIA", "COCAINSCRNUR", "LABBENZ", "AMPHETMU", "THCU", "LABBARB"  Alcohol Level No results found for: "ETH"  CT Angio Head and neck with and without contrast(Personally reviewed): CTH was negative for a large hypodensity concerning for a large territory infarct or hyperdensity concerning for an ICH. No LVO, no significant stenosis.  CT Venogram head(Personally reviewed): No dural sinus venous thrombosis.  MR Brain with and without contrast(Personally reviewed): No acute intracranial abnormalities.  MRI orbits(Personally reviewed): 1. Ill defined, irregular enhancement of the intraconal fat around the optic nerves bilaterally with associated enhancement of the extraocular muscles which remain with normal volume. Findings may be seen the setting of idiopathic orbital inflammation. 2. A 3 mm focus of contrast  enhancement within the left internal auditory canal fundus, may represent a small vestibular schwannoma. 3. Remote right MCA territory infarcts.  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  Clinical presentation is not classic for GCA but certainly still on the differential. Symptoms concerning for GCA include reported fatigue, 20+lbs weight loss, fever, transient/brief vision loss and blurry vision and bitemporal and frontal headahce. Still his ESR and CRP are normal, no jaw claudication, no muscle aches concerning for PMR, no scalp tenderness. Workup with MRI Brain, orbits and CT venogram head negative for dural sinus venous thrombosis, no obvious optic nerve enhancement concerning for optic neuritis. Althou does show BL intraconal fat and extraoccular muscle enhancement without edema, which may be seen in idiopathic orbital inflammation. He is pending muscle biopsy.  Recommendations  - Agree with IV solumedrol given GCA with threatened vision on the differential. Would recommend Solumedrol 1G IV daily for atleast 3 days. He was only given 500mg  IV solumedrol today so I ordered additional 500mg  IV once for today. - not entirely sure what to make of the noted orbital fat and orbital muscle enhancement noted in BL orbits. Will defer that to optho. - Agree with Temporal artery biopsy. Will also order a temporal artery ultrasound to evaluate for halo sign. Will still need temporal artery biopsy if this is negative. - we will continue to follow along. - PPI while on high dose IV steroids. ______________________________________________________________________   Thank you for the opportunity to take part  in the care of this patient. If you have any further questions, please contact the neurology consultation attending.  Signed,  [place] Pager Number [phone] _ _ _   _ __   _ __ _ _  __ __   _ __   __ _

## 2021-12-06 NOTE — Assessment & Plan Note (Addendum)
Continue diltiazem for rate control Hold Eliquis for planned procedure

## 2021-12-06 NOTE — ED Notes (Signed)
Patient transported to CT 

## 2021-12-06 NOTE — ED Notes (Signed)
Receiving RN, Faythe Dingwall, states ready for the patient.

## 2021-12-06 NOTE — Assessment & Plan Note (Addendum)
Blood pressure is stable on Cardizem

## 2021-12-06 NOTE — Progress Notes (Signed)
Cardiology Office Note:    Date:  12/06/2021   ID:  Patrick Salinas, DOB 1950/08/21, MRN [id number]  PCP:  [name], MD   [facility] Providers Cardiologist:  Candee Furbish, MD     Referring MD: [name], MD   History of Present Illness:    Patrick Salinas is a 71 y.o. male here for follow-up of persistent atrial fibrillation and hypertension.   He was seen in the ED 9/'06/2021 with concerns for left eye pain and double vision. Overall workup was reassuring. Patient symptoms likely not related to CVA, acute glaucoma, orbital/periorbital cellulitis, globe perforation, corneal abrasion/ulcer.  Recommend continued therapy at home with oral antihistamine as well as Decadron drops awaiting optometrist evaluation.  No further evaluation deemed necessary while in the ED.  He and his wife are concerned about the possibility of temporal arteritis.  He has seen ophthalmology as well.  This morning awaiting results from ESR as well as C-reactive protein.  Has some transient visual loss left eye when Valsalva.  We are ordering Dopplers as well.  Back in 2020 there was no evidence of significant carotid disease.  He was seen in the atrial fibrillation clinic twice on 12/17/2020 after an episode of facial swelling, heart racing palpitations RVR.  [place]2020.  He was aggressively rehydrated, Cardizem was increased to 120 twice a day.  Right MCA stroke 2020. TPA.  Blood pressure has been fairly soft at times following stroke but overall was doing well.     He has had some presyncopal episodes at times possible orthostatics or dehydration.  Normal EF.   After second Covid vaccine he felt diaphoretic for a few hours dehydrated.  His son who works for EMS checked him out.  Has had some orthostatic issues as described above following his stroke.   At his last visit on 01/04/2021 he was doing very well.  He reported being able to play tennis for several hours duration  without any difficulty and overall was feeling better. No medication changes were made.  Today he reports 3-4 weeks ago he started experiencing watery eyes and nose. It progressed to redness and oozing in the Left eye over the course of a couple weeks. The next morning he experienced double vision and went to the ER. At the ER pressure was normal and he was sent to the ophthalmologist who started oral doxycycline to combat possible viral conjunctivitis. He also had some fever, head pain, especially around the scalp. Also has experienced jaw pain. Recently seen and started testing for possible temporal arteritis.  At times he is completely losing the vision in his left eye, especially during valsalva maneuvers. Usually he tries wearing a cold compress and waits for these episodes to resolve. He is very concerned because his symptoms are not improving and seem to be worsening. For the past 2 weeks he has been largely bedridden due to his symptoms.  He enjoys playing tennis for activity, but this has been impossible due to the intermittent double vision.  He denies any palpitations, chest pain, shortness of breath, or peripheral edema. No lightheadedness, syncope, orthopnea, or PND.    Past Medical History:  Diagnosis Date   Cancer [facility])    Prostate cancer -dx 5'16   Hypercholesterolemia    no meds due to adverse reaction    Past Surgical History:  Procedure Laterality Date   BIOPSY PROSTATE     5'16   CERVICAL FUSION  COLONOSCOPY W/ POLYPECTOMY     x2 benign   IR CT HEAD LTD  05/17/2018   IR PERCUTANEOUS ART THROMBECTOMY/INFUSION INTRACRANIAL INC DIAG ANGIO  05/17/2018   IR US GUIDE VASC ACCESS RIGHT  05/17/2018   RADIOLOGY WITH ANESTHESIA N/A 05/17/2018   Procedure: IR WITH ANESTHESIA;  Surgeon: Corrie Mckusick, DO;  Location: [place];  Service: Anesthesiology;  Laterality: N/A;   ROBOT ASSISTED LAPAROSCOPIC RADICAL PROSTATECTOMY N/A 11/02/2014   Procedure: ROBOTIC ASSISTED LAPAROSCOPIC RADICAL  PROSTATECTOMY LEVEL 1;  Surgeon: Raynelle Bring, MD;  Location: [facility];  Service: Urology;  Laterality: N/A;   TONSILLECTOMY      Current Medications: Current Meds  Medication Sig   apixaban (ELIQUIS) 5 MG TABS tablet Take 1 tablet (5 mg total) by mouth 2 (two) times daily.   diltiazem (CARDIZEM CD) 120 MG 24 hr capsule Take 1 capsule (120 mg total) by mouth 2 (two) times daily.   rosuvastatin (CRESTOR) 20 MG tablet TAKE 1 TABLET BY MOUTH DAILY AT 6 PM.     Allergies:   Hctz [hydrochlorothiazide], Lipitor [atorvastatin calcium], and Pineapple   Social History   Socioeconomic History   Marital status: Married    Spouse name: Not on file   Number of children: Not on file   Years of education: Not on file   Highest education level: Not on file  Occupational History   Not on file  Tobacco Use   Smoking status: Never   Smokeless tobacco: Never  Vaping Use   Vaping Use: Never used  Substance and Sexual Activity   Alcohol use: Yes    Comment: one drink daily   Drug use: No   Sexual activity: Not on file  Other Topics Concern   Not on file  Social History Narrative   Not on file   Social Determinants of Health   Financial Resource Strain: Not on file  Food Insecurity: Not on file  Transportation Needs: Not on file  Physical Activity: Not on file  Stress: Not on file  Social Connections: Not on file     Family History: The patient's family history is not on file.  ROS:   Please see the history of present illness.   (+) left eye pain (+) jaw pain (+) scalp pain (+) temporary sight loss (L eye) (+) double vision   All other systems reviewed and are negative.  EKGs/Labs/Other Studies Reviewed:    The following studies were reviewed today:  ECHO 2021 Normal pump function, EF 55 to 60%.  Mild aortic valve calcification, mild tricuspid regurgitation.  Overall reassuring echocardiogram.  Event monitor 2021  Atrial fibrillation average heart rate 86 bpm, minimum 52  bpm, maximum 197 bpm  No pauses  Overall well controlled atrial fibrillation  EKG:  EKG is personally reviewed.  12/06/2021: Atrial fibrillation 95 bpm well-controlled.  No changes from prior.  Recent Labs: 11/28/2021: ALT 18; BUN 19; Creatinine, Ser 1.13; Hemoglobin 16.8; Platelets 237; Potassium 4.6; Sodium 140  Recent Lipid Panel    Component Value Date/Time   CHOL 217 (H) 05/18/2018 0617   TRIG 66 05/18/2018 0617   HDL 50 05/18/2018 0617   CHOLHDL 4.3 05/18/2018 0617   VLDL 13 05/18/2018 0617   LDLCALC 154 (H) 05/18/2018 0617     Risk Assessment/Calculations:          Physical Exam:    VS:  BP 110/80 (BP Location: Left Arm, Patient Position: Sitting, Cuff Size: Normal)   Pulse 95   Ht 6'  3" (1.905 m)   Wt 202 lb (91.6 kg)   SpO2 96%   BMI 25.25 kg/m     Wt Readings from Last 3 Encounters:  12/06/21 202 lb (91.6 kg)  11/28/21 198 lb (89.8 kg)  01/04/21 209 lb 12.8 oz (95.2 kg)     GEN:  Well nourished, well developed in no acute distress HEENT: Prominent vessels of temporal regions NECK: No JVD; No carotid bruits LYMPHATICS: No lymphadenopathy CARDIAC: Irregularly irregular normal rate, no murmurs, rubs, gallops RESPIRATORY:  Clear to auscultation without rales, wheezing or rhonchi  ABDOMEN: Soft, non-tender, non-distended MUSCULOSKELETAL:  No edema; No deformity  SKIN: Warm and dry NEUROLOGIC:  Alert and oriented x 3 PSYCHIATRIC:  Normal affect   ASSESSMENT:    1. Atrial fibrillation, unspecified type ([place])   2. Essential hypertension   3. Hyperlipidemia LDL goal <70     PLAN:    In order of problems listed above:  Embolic stroke involving right middle cerebral artery (HCC) s/p tPA and mechanical thrombectomy, d/t AF Overall doing well.  Continue with goal-directed medical therapy.  No changes made today.   Atrial fibrillation (HCC) Currently rate controlled on diltiazem 120 mg twice a day.  Continue with Eliquis 5 mg twice a day as well.  If he  ends up taking prednisone, heart rate may increase.  May need an additional diltiazem in that situation.   Essential hypertension Currently well controlled blood pressure.  No changes.  Has had some issues in the past with hypotension.   Hyperlipidemia LDL goal <70 Continue with Crestor 20 mg once a day.  LDL goal less than 70.  No myalgias.  Doing well, last LDL 80.   Secondary hypercoagulable state ([place]) Eliquis.  CHA2DS2-VASc score 4.   Transient visual disturbance/amaurosis fugax They are concerned about the possibility of temporal arteritis.  He is currently being worked up thoroughly by ophthalmology.  He has an appointment later today with his primary care physician Dr. [name] as well.  ESR as well as CRP are currently drawn they await awaiting results.  We will go ahead and check a carotid duplex.  Prior CT scan in 2020 showed no evidence of significant carotid disease.  They are going to follow-up with the ophthalmologist as well.  Follow up: 6 mths  Medication Adjustments/Labs and Tests Ordered: Current medicines are reviewed at length with the patient today.  Concerns regarding medicines are outlined above.   Orders Placed This Encounter  Procedures   EKG 12-Lead   VAS US CAROTID   No orders of the defined types were placed in this encounter.  Patient Instructions  Medication Instructions:  Your physician recommends that you continue on your current medications as directed. Please refer to the Current Medication list given to you today.  *If you need a refill on your cardiac medications before your next appointment, please call your pharmacy*  Testing/Procedures: Carotid US Your physician has requested that you have a carotid duplex. This test is an ultrasound of the carotid arteries in your neck. It looks at blood flow through these arteries that supply the brain with blood. Allow one hour for this exam. There are no restrictions or special instructions.     Follow-Up: At [facility], you and your health needs are our priority.  As part of our continuing mission to provide you with exceptional heart care, we have created designated Provider Care Teams.  These Care Teams include your primary Cardiologist (physician) and Advanced Practice Providers (  APPs -  Physician Assistants and Nurse Practitioners) who all work together to provide you with the care you need, when you need it.   Your next appointment:   6 month(s)  The format for your next appointment:   In Person  Provider:   Candee Furbish, MD               Burnett Kanaris Ford,acting as a scribe for Candee Furbish, MD.,have documented all relevant documentation on the behalf of Candee Furbish, MD,as directed by  Candee Furbish, MD while in the presence of Candee Furbish, MD.   I, Candee Furbish, MD, have reviewed all documentation for this visit. The documentation on 12/06/21 for the exam, diagnosis, procedures, and orders are all accurate and complete.   Signed, Candee Furbish, MD  12/06/2021 9:43 AM    [facility]

## 2021-12-06 NOTE — Assessment & Plan Note (Addendum)
Patient presents for evaluation of intermittent visual loss initially involving his left eye but now involves both eyes associated with blurred vision. Concern for possible GCA Obtain ESR and CRP (per patient were done as an outpatient and were normal) Obtain MRI of the brain to rule out an acute stroke Start patient on high-dose systemic steroids Consult surgery for temporal artery biopsy

## 2021-12-06 NOTE — H&P (Signed)
History and Physical    Patient: Patrick Salinas FAO:[id number] DOB: 06-Jan-1951 DOA: 12/06/2021 DOS: the patient was seen and examined on 12/06/2021 PCP: [name], MD  Patient coming from: Home  Chief Complaint:  Chief Complaint  Patient presents with   Loss of Vision   HPI: Patrick Salinas is a 71 y.o. male with medical history significant for CVA status post tPA, status post arterial thrombectomy with no deficits, history of prostate cancer status post robotic assisted laparoscopic radical prostatectomy, history of A-fib on chronic anticoagulation therapy who was referred to the ER by his ophthalmologist for evaluation of intermittent visual loss which initially started in his left eye but has now involved both eyes. Patient states his symptoms started about 4 weeks ago and initially involved his left eye.  He noted double vision in his left eye and was seen initially at the urgent care center where he was prescribed Decadron eyedrops.  He eventually followed up with an optometrist and was treated for possible viral conjunctivitis without any improvement in his symptoms.  Patient states that over the last several days he has had intermittent visual loss involving his left eye which improves over time as well as double vision.  He was seen by ophthalmology and is currently being worked up for possible GCA.  Patient states he was told he had a normal ESR and CRP but still needed work-up for temporal arteritis. On the day of admission while using the commode he had a Valsalva episode and sudden loss of vision involving his left eye.  He also had an episode involving his right eye both intermittent and have resolved at the time of this evaluation.  He also notes bandlike pain across his forehead over the last several days with intermittent fever and chills.  He was referred to the ER for further evaluation. He did denies having any chest pain, no shortness of breath, no nausea, no vomiting, no  dizziness, no lightheadedness, no urinary symptoms, no leg swelling or changes in his bowel habits.    Review of Systems: As mentioned in the history of present illness. All other systems reviewed and are negative. Past Medical History:  Diagnosis Date   Cancer [facility])    Prostate cancer -dx 5'16   Hypercholesterolemia    no meds due to adverse reaction   Past Surgical History:  Procedure Laterality Date   BIOPSY PROSTATE     5'16   CERVICAL FUSION     COLONOSCOPY W/ POLYPECTOMY     x2 benign   IR CT HEAD LTD  05/17/2018   IR PERCUTANEOUS ART THROMBECTOMY/INFUSION INTRACRANIAL INC DIAG ANGIO  05/17/2018   IR US GUIDE VASC ACCESS RIGHT  05/17/2018   RADIOLOGY WITH ANESTHESIA N/A 05/17/2018   Procedure: IR WITH ANESTHESIA;  Surgeon: Corrie Mckusick, DO;  Location: [place];  Service: Anesthesiology;  Laterality: N/A;   ROBOT ASSISTED LAPAROSCOPIC RADICAL PROSTATECTOMY N/A 11/02/2014   Procedure: ROBOTIC ASSISTED LAPAROSCOPIC RADICAL PROSTATECTOMY LEVEL 1;  Surgeon: Raynelle Bring, MD;  Location: [facility];  Service: Urology;  Laterality: N/A;   TONSILLECTOMY     Social History:  reports that he has never smoked. He has never used smokeless tobacco. He reports current alcohol use. He reports that he does not use drugs.  Allergies  Allergen Reactions   Hctz [Hydrochlorothiazide] Other (See Comments)    Myalgia/ raises blood sugar   Lipitor [Atorvastatin Calcium] Other (See Comments)    Myalgia/ raises blood sugar   Pineapple Swelling  Tongue swelling    No family history on file.  Prior to Admission medications   Medication Sig Start Date End Date Taking? Authorizing Provider  apixaban (ELIQUIS) 5 MG TABS tablet Take 1 tablet (5 mg total) by mouth 2 (two) times daily. 05/20/18  Yes Donzetta Starch, NP  diltiazem (CARDIZEM CD) 120 MG 24 hr capsule Take 1 capsule (120 mg total) by mouth 2 (two) times daily. 12/17/20  Yes Fenton, Clint R, PA  loratadine (CLARITIN) 10 MG tablet Take 10 mg by  mouth daily.   Yes [provider]  oxymetazoline (AFRIN) 0.05 % nasal spray Place 1 spray into both nostrils daily as needed for congestion.   Yes [provider]  rosuvastatin (CRESTOR) 20 MG tablet TAKE 1 TABLET BY MOUTH DAILY AT 6 PM. Patient taking differently: Take 20 mg by mouth daily at 6 PM. 10/03/21  Yes Skains, Thana Farr, MD  Triamcinolone Acetonide (NASACORT ALLERGY 24HR NA) Place 1 spray into the nose daily.   Yes [provider]    Physical Exam: Vitals:   12/06/21 1046 12/06/21 1332 12/06/21 1400 12/06/21 1430  BP: (!) 129/102 (!) 137/95 (!) 144/95 (!) 134/96  Pulse: 84 84 85 86  Resp: _0 (!) 24  Temp: 97.8 F (36.6 C)     TempSrc: Oral     SpO2: 98% 92% 96% 99%   Physical Exam Vitals and nursing note reviewed.  Constitutional:      Appearance: Normal appearance.  HENT:     Head: Normocephalic and atraumatic.     Nose: Nose normal.     Mouth/Throat:     Mouth: Mucous membranes are moist.  Eyes:     Pupils: Pupils are equal, round, and reactive to light.  Cardiovascular:     Rate and Rhythm: Normal rate and regular rhythm.  Pulmonary:     Effort: Pulmonary effort is normal.     Breath sounds: Normal breath sounds.  Abdominal:     General: Abdomen is flat. Bowel sounds are normal.     Palpations: Abdomen is soft.  Musculoskeletal:        General: Normal range of motion.     Cervical back: Normal range of motion and neck supple.  Skin:    General: Skin is warm and dry.  Neurological:     General: No focal deficit present.     Mental Status: He is alert and oriented to person, place, and time.  Psychiatric:        Mood and Affect: Mood normal.        Behavior: Behavior normal.     Data Reviewed: Relevant notes from primary care and specialist visits, past discharge summaries as available in EHR, including Care Everywhere. Prior diagnostic testing as pertinent to current admission diagnoses Updated medications and problem  lists for reconciliation ED course, including vitals, labs, imaging, treatment and response to treatment Triage notes, nursing and pharmacy notes and ED provider's notes Notable results as noted in HPI Labs reviewed.  Sodium 140, potassium 4.6, chloride 105, bicarb 25, glucose 98, BUN 16, creatinine 1.02, calcium 9.8, total protein 7.7, albumin 4.2, AST 22, ALT 25, alkaline phosphatase 94, total bilirubin 1.3, white count 9.3, hemoglobin 17.4, hematocrit 52.1, platelet count 277 CT angiogram of the head and neck/CT venogram showed no acute intracranial pathology.Remote infarcts in the right MCA distribution.Patent vasculature of the head and neck with no hemodynamically significant stenosis or occlusion.No evidence of venous thrombosis. There are no new results  to review at this time.  Assessment and Plan: * Visual loss, left eye Patient presents for evaluation of intermittent visual loss initially involving his left eye but now involves both eyes associated with blurred vision. Concern for possible GCA Obtain ESR and CRP (per patient were done as an outpatient and were normal) Obtain MRI of the brain to rule out an acute stroke Start patient on high-dose systemic steroids Consult surgery for temporal artery biopsy  Essential hypertension Blood pressure is stable on Cardizem  Atrial fibrillation (HCC) Continue diltiazem for rate control Continue Eliquis as secondary prophylaxis for an acute stroke      Advance Care Planning:   Code Status: Full Code   Consults: Surgery  Family Communication: Greater than 50% of time was spent discussing patient's condition and plan of care with him and his wife at the bedside.  All questions and concerns have been addressed.  They verbalized understanding and agree with the plan.  Severity of Illness: The appropriate patient status for this patient is OBSERVATION. Observation status is judged to be reasonable and necessary in order to provide the  required intensity of service to ensure the patient's safety. The patient's presenting symptoms, physical exam findings, and initial radiographic and laboratory data in the context of their medical condition is felt to place them at decreased risk for further clinical deterioration. Furthermore, it is anticipated that the patient will be medically stable for discharge from the hospital within 2 midnights of admission.   Author: Collier Bullock, MD 12/06/2021 3:25 PM  For on call review [email or web address].

## 2021-12-06 NOTE — ED Notes (Signed)
Neurology at bedside.

## 2021-12-06 NOTE — ED Triage Notes (Signed)
Patient here with intermittent vision loss in left eye that started Saturday and intermittent right vision loss started today. Also reports an intermittent mild pressure across his forehead. Patient is alert, oriented, denies any other  complaints at this time.

## 2021-12-06 NOTE — ED Provider Notes (Signed)
[facility] EMERGENCY DEPARTMENT Provider Note   CSN: [id number] Arrival date & time: 12/06/21  1038     History Chief Complaint  Patient presents with   Loss of Vision    Patrick Salinas is a 71 y.o. male with h/o afib, previous stroke, hypertension, hyperlipidemia presents to the emergency department for evaluation of 2 weeks of left eye diplopia and vision loss intermittently.  Patient reports that he has had intermittent diplopia of the left eye and complete vision loss intermittently for the past 2 weeks that he is followed up with ophthalmology about.  He was seen by Dr. Katy Fitch, with ophthalmology, and was told that his global exam looks normal and he is concerned for GCA or posterior stroke.  He ordered labs and an MRI imaging.  Patient reports his MRI was as before today.  He reports his sed rate was normal however the CRP was still pending.  He reports that he has had a bandlike headache around his head at whenever he goes to bed at night however when he wakes up in the morning takes 2 Tylenol, this headache is relieved.  Denies any trouble walking or trouble talking.  He reports that earlier he was having a Valsalva maneuver with having a bowel movement and had a loss of vision of his left eye that slowly returned after a few minutes.  No LOC.  No syncope.  He denies any chest pain or shortness of breath.  He reports that initially he was seen at urgent care and was given some dexamethasone drops.  Reports compliance with his medications.  HPI     Home Medications Prior to Admission medications   Medication Sig Start Date End Date Taking? Authorizing Provider  apixaban (ELIQUIS) 5 MG TABS tablet Take 1 tablet (5 mg total) by mouth 2 (two) times daily. 05/20/18   Donzetta Starch, NP  diltiazem (CARDIZEM CD) 120 MG 24 hr capsule Take 1 capsule (120 mg total) by mouth 2 (two) times daily. 12/17/20   Fenton, Clint R, PA  rosuvastatin (CRESTOR) 20 MG tablet TAKE 1 TABLET BY  MOUTH DAILY AT 6 PM. 10/03/21   Jerline Pain, MD      Allergies    Hctz [hydrochlorothiazide], Lipitor [atorvastatin calcium], and Pineapple    Review of Systems   Review of Systems  Constitutional:  Negative for chills and fever.  Eyes:  Positive for visual disturbance. Negative for photophobia.  Respiratory:  Negative for shortness of breath.   Cardiovascular:  Negative for chest pain.  Neurological:  Positive for headaches. Negative for dizziness, syncope and light-headedness.    Physical Exam Updated Vital Signs BP (!) 129/102 (BP Location: Left Arm)   Pulse 84   Temp 97.8 F (36.6 C) (Oral)   Resp 18   SpO2 98%  Physical Exam Vitals and nursing note reviewed.  Constitutional:      General: He is not in acute distress.    Appearance: Normal appearance. He is not ill-appearing or toxic-appearing.  HENT:     Head: Normocephalic and atraumatic.  Eyes:     General: No visual field deficit or scleral icterus.    Extraocular Movements: Extraocular movements intact.     Conjunctiva/sclera: Conjunctivae normal.     Pupils: Pupils are equal, round, and reactive to light.  Cardiovascular:     Rate and Rhythm: Normal rate. Rhythm irregular.  Pulmonary:     Effort: Pulmonary effort is normal. No respiratory distress.  Breath sounds: Normal breath sounds.  Abdominal:     General: Bowel sounds are normal.     Palpations: Abdomen is soft.     Tenderness: There is no abdominal tenderness. There is no guarding or rebound.  Musculoskeletal:        General: No deformity.     Cervical back: Normal range of motion. No rigidity.  Skin:    General: Skin is warm and dry.  Neurological:     General: No focal deficit present.     Mental Status: He is alert. Mental status is at baseline.     GCS: GCS eye subscore is 4. GCS verbal subscore is 5. GCS motor subscore is 6.     Cranial Nerves: No cranial nerve deficit, dysarthria or facial asymmetry.     Sensory: No sensory deficit.      Motor: No weakness or pronator drift.     Coordination: Coordination normal. Finger-Nose-Finger Test and Heel to [place] Test normal.     Comments: No diplopia now, cranial nerves II-XII otherwise intact.  Patient is alert and oriented.  He is answering questions appropriately with appropriate speech.  No facial droop noted.  No visual field deficit noted.  Sensation intact throughout.  No pronator drift.  Strength is equal in patient's upper and lower bilateral extremities.  Normal finger-nose.  Normal heel-to-shin.  Normal coordination.     ED Results / Procedures / Treatments   Labs (all labs ordered are listed, but only abnormal results are displayed) Labs Reviewed  COMPREHENSIVE METABOLIC PANEL - Abnormal; Notable for the following components:      Result Value   Total Bilirubin 1.3 (*)    All other components within normal limits  CBC WITH DIFFERENTIAL/PLATELET - Abnormal; Notable for the following components:   Hemoglobin 17.4 (*)    HCT 52.1 (*)    All other components within normal limits    EKG None  Radiology MR Brain W and Wo Contrast  Result Date: 12/06/2021 CLINICAL DATA:  Vision loss, monocular; diplopia, vision loss intermittent in left eye. EXAM: MRI HEAD AND ORBITS WITHOUT AND WITH CONTRAST TECHNIQUE: Multiplanar, multiecho pulse sequences of the brain and surrounding structures were obtained without and with intravenous contrast. Multiplanar, multiecho pulse sequences of the orbits and surrounding structures were obtained including fat saturation techniques, before and after intravenous contrast administration. CONTRAST:  70mL GADAVIST GADOBUTROL 1 MMOL/ML IV SOLN COMPARISON:  Head CT December 06, 2021; MRI of the brain May 18, 2018. FINDINGS: MRI HEAD FINDINGS Brain: No acute infarction, hemorrhage, hydrocephalus or extra-axial collection. Areas of encephalomalacia and gliosis in the right temporal and parietal lobes related to prior right MCA territory infarct.  Scattered foci of T2 hyperintensity within the white matter of the cerebral hemispheres, nonspecific, most likely related to chronic microangiopathy. Moderate parenchymal volume loss. A 3 mm focus of abnormal contrast enhancement is seen in the left internal auditory canal. Vascular: Normal flow voids. Skull and upper cervical spine: Normal marrow signal. Other: None. MRI ORBITS FINDINGS Orbits: Irregular, shaggy enhancement for of the intraconal fat bilaterally, particularly around the optic nerve sheaths and posterior to the globes. Prominent enhancement of the extraocular muscles without increased volume or mass effect. The globes and lacrimal glands appear preserved. No enhancement of the optic nerves. Visualized sinuses: Clear. Soft tissues: Negative. IMPRESSION: 1. Ill defined, irregular enhancement of the intraconal fat around the optic nerves bilaterally with associated enhancement of the extraocular muscles which remain with normal volume. Findings may be seen  the setting of idiopathic orbital inflammation. 2. A 3 mm focus of contrast enhancement within the left internal auditory canal fundus, may represent a small vestibular schwannoma. 3. Remote right MCA territory infarcts. These results were called by telephone at the time of interpretation on 12/06/2021 at 5:49 pm to provider Vivica Dobosz , who verbally acknowledged these results. Electronically Signed   By: Pedro Earls M.D.   On: 12/06/2021 17:51   MR ORBITS W WO CONTRAST  Result Date: 12/06/2021 CLINICAL DATA:  Vision loss, monocular; diplopia, vision loss intermittent in left eye. EXAM: MRI HEAD AND ORBITS WITHOUT AND WITH CONTRAST TECHNIQUE: Multiplanar, multiecho pulse sequences of the brain and surrounding structures were obtained without and with intravenous contrast. Multiplanar, multiecho pulse sequences of the orbits and surrounding structures were obtained including fat saturation techniques, before and after intravenous  contrast administration. CONTRAST:  15mL GADAVIST GADOBUTROL 1 MMOL/ML IV SOLN COMPARISON:  Head CT December 06, 2021; MRI of the brain May 18, 2018. FINDINGS: MRI HEAD FINDINGS Brain: No acute infarction, hemorrhage, hydrocephalus or extra-axial collection. Areas of encephalomalacia and gliosis in the right temporal and parietal lobes related to prior right MCA territory infarct. Scattered foci of T2 hyperintensity within the white matter of the cerebral hemispheres, nonspecific, most likely related to chronic microangiopathy. Moderate parenchymal volume loss. A 3 mm focus of abnormal contrast enhancement is seen in the left internal auditory canal. Vascular: Normal flow voids. Skull and upper cervical spine: Normal marrow signal. Other: None. MRI ORBITS FINDINGS Orbits: Irregular, shaggy enhancement for of the intraconal fat bilaterally, particularly around the optic nerve sheaths and posterior to the globes. Prominent enhancement of the extraocular muscles without increased volume or mass effect. The globes and lacrimal glands appear preserved. No enhancement of the optic nerves. Visualized sinuses: Clear. Soft tissues: Negative. IMPRESSION: 1. Ill defined, irregular enhancement of the intraconal fat around the optic nerves bilaterally with associated enhancement of the extraocular muscles which remain with normal volume. Findings may be seen the setting of idiopathic orbital inflammation. 2. A 3 mm focus of contrast enhancement within the left internal auditory canal fundus, may represent a small vestibular schwannoma. 3. Remote right MCA territory infarcts. These results were called by telephone at the time of interpretation on 12/06/2021 at 5:49 pm to provider Audray Rumore , who verbally acknowledged these results. Electronically Signed   By: Pedro Earls M.D.   On: 12/06/2021 17:51   CT ANGIO HEAD NECK W WO CM  Result Date: 12/06/2021 CLINICAL DATA:  Diplopia, vision loss. EXAM: CT  ANGIOGRAPHY HEAD AND NECK CT VENOGRAM HEAD TECHNIQUE: Multidetector CT imaging of the head and neck was performed using the standard protocol during bolus administration of intravenous contrast. Multiplanar CT image reconstructions and MIPs were obtained to evaluate the vascular anatomy. Carotid stenosis measurements (when applicable) are obtained utilizing NASCET criteria, using the distal internal carotid diameter as the denominator. Venographic phase images of the brain were obtained following the administration of intravenous contrast. Multiplanar reformats and maximum intensity projections were generated. RADIATION DOSE REDUCTION: This exam was performed according to the departmental dose-optimization program which includes automated exposure control, adjustment of the mA and/or kV according to patient size and/or use of iterative reconstruction technique. CONTRAST:  19mL OMNIPAQUE IOHEXOL 350 MG/ML SOLN COMPARISON:  MR head 05/18/2018, CTA head and neck 05/17/2018 FINDINGS: CT HEAD FINDINGS Brain: There is no acute intracranial hemorrhage, extra-axial fluid collection, or acute infarct. Background parenchymal volume is normal. The ventricles are  normal in size. There is encephalomalacia in the right parietal and temporal lobes related to prior MCA distribution infarcts seen on prior brain MRI. There is no other encephalomalacia. There is no mass lesion.  There is no mass effect or midline shift. Vascular: See below. Skull: Normal. Negative for fracture or focal lesion. Sinuses/Orbits: The paranasal sinuses are clear. The globes and orbits are unremarkable. Other: None. Review of the MIP images confirms the above findings CTA NECK FINDINGS Aortic arch: The imaged aortic arch is normal. The origins of the major branch vessels are patent. The subclavian arteries are patent to the level imaged. Right carotid system: The right common, internal, and external carotid arteries are patent, without hemodynamically  significant stenosis or occlusion. There is no dissection or aneurysm. Left carotid system: The left common, internal, and external carotid arteries are patent, without hemodynamically significant stenosis or occlusion. There is minimal plaque at the bifurcation there is no dissection or aneurysm. Vertebral arteries: The vertebral arteries are patent, without hemodynamically significant stenosis or occlusion. There is no dissection or aneurysm. Skeleton: There is osseous fusion of the C3 through C5 vertebral bodies. There is no acute osseous abnormality or suspicious osseous lesion. There is no visible canal hematoma. Other neck: The soft tissues of the neck are unremarkable. Upper chest: The imaged lung apices are clear. An accessory azygous fissure is noted. Review of the MIP images confirms the above findings CTA HEAD FINDINGS Anterior circulation: The intracranial ICAs are patent. The right M1 segment and branch vessels are patent. The left M1 segment and branch vessels are patent. The bilateral ACAs are patent. The anterior communicating artery is normal. There is no aneurysm or AVM. Posterior circulation: There is mild irregularity of the V4 segments likely reflecting atherosclerotic disease without hemodynamically significant stenosis or occlusion. The basilar artery is patent. The major cerebellar arteries are identified The bilateral PCAs are patent. Bilateral posterior communicating arteries are identified with a small left P1 segment and no right P1 segment (primarily fetal origins bilaterally). There is no aneurysm or AVM. Venous sinuses: Assessed below on the CT venogram portion of the exam. Anatomic variants: As above. Review of the MIP images confirms the above findings CTV HEAD FINDINGS The superior sagittal sinus, internal cerebral veins, vein of Galen, straight sinus, transverse sinuses, sigmoid sinuses, and jugular bulbs are patent without evidence of thrombus or significant stenosis. IMPRESSION:  1. No acute intracranial pathology. 2. Remote infarcts in the right MCA distribution. 3. Patent vasculature of the head and neck with no hemodynamically significant stenosis or occlusion. 4. No evidence of venous thrombosis. Electronically Signed   By: Valetta Mole M.D.   On: 12/06/2021 13:58   CT VENOGRAM HEAD  Result Date: 12/06/2021 CLINICAL DATA:  Diplopia, vision loss. EXAM: CT ANGIOGRAPHY HEAD AND NECK CT VENOGRAM HEAD TECHNIQUE: Multidetector CT imaging of the head and neck was performed using the standard protocol during bolus administration of intravenous contrast. Multiplanar CT image reconstructions and MIPs were obtained to evaluate the vascular anatomy. Carotid stenosis measurements (when applicable) are obtained utilizing NASCET criteria, using the distal internal carotid diameter as the denominator. Venographic phase images of the brain were obtained following the administration of intravenous contrast. Multiplanar reformats and maximum intensity projections were generated. RADIATION DOSE REDUCTION: This exam was performed according to the departmental dose-optimization program which includes automated exposure control, adjustment of the mA and/or kV according to patient size and/or use of iterative reconstruction technique. CONTRAST:  26mL OMNIPAQUE IOHEXOL 350 MG/ML SOLN COMPARISON:  MR head 05/18/2018, CTA head and neck 05/17/2018 FINDINGS: CT HEAD FINDINGS Brain: There is no acute intracranial hemorrhage, extra-axial fluid collection, or acute infarct. Background parenchymal volume is normal. The ventricles are normal in size. There is encephalomalacia in the right parietal and temporal lobes related to prior MCA distribution infarcts seen on prior brain MRI. There is no other encephalomalacia. There is no mass lesion.  There is no mass effect or midline shift. Vascular: See below. Skull: Normal. Negative for fracture or focal lesion. Sinuses/Orbits: The paranasal sinuses are clear. The globes  and orbits are unremarkable. Other: None. Review of the MIP images confirms the above findings CTA NECK FINDINGS Aortic arch: The imaged aortic arch is normal. The origins of the major branch vessels are patent. The subclavian arteries are patent to the level imaged. Right carotid system: The right common, internal, and external carotid arteries are patent, without hemodynamically significant stenosis or occlusion. There is no dissection or aneurysm. Left carotid system: The left common, internal, and external carotid arteries are patent, without hemodynamically significant stenosis or occlusion. There is minimal plaque at the bifurcation there is no dissection or aneurysm. Vertebral arteries: The vertebral arteries are patent, without hemodynamically significant stenosis or occlusion. There is no dissection or aneurysm. Skeleton: There is osseous fusion of the C3 through C5 vertebral bodies. There is no acute osseous abnormality or suspicious osseous lesion. There is no visible canal hematoma. Other neck: The soft tissues of the neck are unremarkable. Upper chest: The imaged lung apices are clear. An accessory azygous fissure is noted. Review of the MIP images confirms the above findings CTA HEAD FINDINGS Anterior circulation: The intracranial ICAs are patent. The right M1 segment and branch vessels are patent. The left M1 segment and branch vessels are patent. The bilateral ACAs are patent. The anterior communicating artery is normal. There is no aneurysm or AVM. Posterior circulation: There is mild irregularity of the V4 segments likely reflecting atherosclerotic disease without hemodynamically significant stenosis or occlusion. The basilar artery is patent. The major cerebellar arteries are identified The bilateral PCAs are patent. Bilateral posterior communicating arteries are identified with a small left P1 segment and no right P1 segment (primarily fetal origins bilaterally). There is no aneurysm or AVM.  Venous sinuses: Assessed below on the CT venogram portion of the exam. Anatomic variants: As above. Review of the MIP images confirms the above findings CTV HEAD FINDINGS The superior sagittal sinus, internal cerebral veins, vein of Galen, straight sinus, transverse sinuses, sigmoid sinuses, and jugular bulbs are patent without evidence of thrombus or significant stenosis. IMPRESSION: 1. No acute intracranial pathology. 2. Remote infarcts in the right MCA distribution. 3. Patent vasculature of the head and neck with no hemodynamically significant stenosis or occlusion. 4. No evidence of venous thrombosis. Electronically Signed   By: Valetta Mole M.D.   On: 12/06/2021 13:58    Procedures Procedures   Medications Ordered in ED Medications - No data to display  ED Course/ Medical Decision Making/ A&P                           Medical Decision Making Amount and/or Complexity of Data Reviewed Labs: ordered. Radiology: ordered.  Risk Prescription drug management. Decision regarding hospitalization.   71 year old male presents the Emergency Department for evaluation of intermittent diplopia and visual loss of the left eye.  Differential diagnosis includes was limited to venous thrombosis some in brain, stroke, TIA, GCA, optic neuritis, retinal  detachment.  Vital signs show slightly elevated blood pressure otherwise normal.  Physical exam as noted above.  I initially saw this patient in triage and spoke with neurologist, Dr. Curly Shores, who recommended doing a CT a of the head and neck and later on an MRI with and without contrast of the brain.  Labs ordered as well.  I independently reviewed and interpreted the patient's labs.  CBC shows slight hemoconcentration with a hemoglobin of 17.4 and hematocrit at 52.1.  No leukocytosis.  Normal platelets.  CMP shows mildly elevated bili total bili at 1.3.  Electrolytes and LFTs within normal limits.  ESR 4, CRP less than 0.5.  I spoke with Dr. Katy Fitch,  ophthalmology, who saw the patient in the outpatient setting.  He reports that if the neurological imaging is unremarkable for any stroke, he would like the patient admitted for IV steroids as well as a temporal artery biopsy.  He reports that there are a small percentage of cases that have a negative ESR, negative CRP, and no temporal artery tenderness but still have GCA.  CTA of the head and neck show  1. No acute intracranial pathology. 2. Remote infarcts in the right MCA distribution. 3. Patent vasculature of the head and neck with no hemodynamically significant stenosis or occlusion. 4. No evidence of venous thrombosis.  CTV shows  1. No acute intracranial pathology. 2. Remote infarcts in the right MCA distribution. 3. Patent vasculature of the head and neck with no hemodynamically significant stenosis or occlusion. 4. No evidence of venous thrombosis.  Given the unremarkable imaging , and after my discussion with Dr. Katy Fitch, will admit this patient for GCA workup/rule out with steroids and temporal artery biopsy.  MRI with and without contrast including orbits and brain pending.  Discussed this case with admitting physician.  I discussed this case with my attending physician who cosigned this note including patient's presenting symptoms, physical exam, and planned diagnostics and interventions. Attending physician stated agreement with plan or made changes to plan which were implemented.   ADDENDUM: I later received critical MRI results. Relayed the information to Dr. Francine Graven via secure chat. She messaged confirmation.   Final Clinical Impression(s) / ED Diagnoses Final diagnoses:  Diplopia  Vision loss of left eye    Rx / DC Orders ED Discharge Orders     None         Sherrell Puller, PA-C 12/06/21 1952    Valarie Merino, MD 12/07/21 [phone]

## 2021-12-06 NOTE — H&P (Addendum)
History and Physical    Patient: Patrick Salinas WPY:[id number] DOB: 1951-01-13 DOA: 12/06/2021 DOS: the patient was seen and examined on 12/06/2021 PCP: [name], MD  Patient coming from: Home  Chief Complaint:  Chief Complaint  Patient presents with   Loss of Vision   HPI: AASHISH Salinas is a 71 y.o. male with medical history significant for CVA status post tPA, status post arterial thrombectomy with no deficits, history of prostate cancer status post robotic assisted laparoscopic radical prostatectomy, history of A-fib on chronic anticoagulation therapy who was referred to the ER by his ophthalmologist for evaluation of intermittent visual loss which initially started in his left eye but has now involved both eyes. Patient states his symptoms started about 4 weeks ago and initially started out as rhinitis and watery eyes.  He was treated with over-the-counter allergy medications without any improvement in his symptoms.  Most of his symptoms initially involved his left eye.  He noted double vision in his left eye and was seen initially at the urgent care center where he was prescribed Decadron eyedrops.  He eventually followed up with an optometrist and was treated for possible viral conjunctivitis without any improvement in his symptoms.  Patient states that over the last several days he has had intermittent visual loss involving his left eye which improves over time as well as double vision.  He was seen by ophthalmology and is currently being worked up for possible GCA.  Patient states he was told he had a normal ESR and CRP but still needed work-up for temporal arteritis. On the day of admission while using the commode he had a Valsalva episode and sudden loss of vision involving his left eye.  He also had an episode involving his right eye both intermittent and have resolved at the time of this evaluation.  He also notes bandlike pain across his forehead over the last several days with  intermittent fever and chills.  He was referred to the ER for further evaluation. He did denies having any chest pain, no shortness of breath, no nausea, no vomiting, no dizziness, no lightheadedness, no urinary symptoms, no leg swelling or changes in his bowel habits.    Review of Systems: As mentioned in the history of present illness. All other systems reviewed and are negative. Past Medical History:  Diagnosis Date   Cancer [facility])    Prostate cancer -dx 5'16   Hypercholesterolemia    no meds due to adverse reaction   Past Surgical History:  Procedure Laterality Date   BIOPSY PROSTATE     5'16   CERVICAL FUSION     COLONOSCOPY W/ POLYPECTOMY     x2 benign   IR CT HEAD LTD  05/17/2018   IR PERCUTANEOUS ART THROMBECTOMY/INFUSION INTRACRANIAL INC DIAG ANGIO  05/17/2018   IR US GUIDE VASC ACCESS RIGHT  05/17/2018   RADIOLOGY WITH ANESTHESIA N/A 05/17/2018   Procedure: IR WITH ANESTHESIA;  Surgeon: Corrie Mckusick, DO;  Location: [facility];  Service: Anesthesiology;  Laterality: N/A;   ROBOT ASSISTED LAPAROSCOPIC RADICAL PROSTATECTOMY N/A 11/02/2014   Procedure: ROBOTIC ASSISTED LAPAROSCOPIC RADICAL PROSTATECTOMY LEVEL 1;  Surgeon: Raynelle Bring, MD;  Location: [facility];  Service: Urology;  Laterality: N/A;   TONSILLECTOMY     Social History:  reports that he has never smoked. He has never used smokeless tobacco. He reports current alcohol use. He reports that he does not use drugs.  Allergies  Allergen Reactions   Hctz [Hydrochlorothiazide] Other (See Comments)  Myalgia/ raises blood sugar   Lipitor [Atorvastatin Calcium] Other (See Comments)    Myalgia/ raises blood sugar   Pineapple Swelling    Tongue swelling    No family history on file.  Prior to Admission medications   Medication Sig Start Date End Date Taking? Authorizing Provider  apixaban (ELIQUIS) 5 MG TABS tablet Take 1 tablet (5 mg total) by mouth 2 (two) times daily. 05/20/18  Yes Donzetta Starch, NP  diltiazem (CARDIZEM  CD) 120 MG 24 hr capsule Take 1 capsule (120 mg total) by mouth 2 (two) times daily. 12/17/20  Yes Fenton, Clint R, PA  loratadine (CLARITIN) 10 MG tablet Take 10 mg by mouth daily.   Yes [provider]  oxymetazoline (AFRIN) 0.05 % nasal spray Place 1 spray into both nostrils daily as needed for congestion.   Yes [provider]  rosuvastatin (CRESTOR) 20 MG tablet TAKE 1 TABLET BY MOUTH DAILY AT 6 PM. Patient taking differently: Take 20 mg by mouth daily at 6 PM. 10/03/21  Yes Skains, Thana Farr, MD  Triamcinolone Acetonide (NASACORT ALLERGY 24HR NA) Place 1 spray into the nose daily.   Yes [provider]    Physical Exam: Vitals:   12/06/21 1430 12/06/21 1500 12/06/21 1530 12/06/21 1710  BP: (!) 134/96 (!) 147/99 (!) 145/91 (!) 131/105  Pulse: 86 73 83 (!) 59  Resp: (!) 24 17 (!) 22 14  Temp:    98.6 F (37 C)  TempSrc:    Oral  SpO2: 99% 99% 97% 94%   Physical Exam Vitals and nursing note reviewed.  Constitutional:      Appearance: Normal appearance.  HENT:     Head: Normocephalic and atraumatic.     Nose: Nose normal.     Mouth/Throat:     Mouth: Mucous membranes are moist.  Eyes:     Pupils: Pupils are equal, round, and reactive to light.  Cardiovascular:     Rate and Rhythm: Normal rate and regular rhythm.  Pulmonary:     Effort: Pulmonary effort is normal.     Breath sounds: Normal breath sounds.  Abdominal:     General: Abdomen is flat. Bowel sounds are normal.     Palpations: Abdomen is soft.  Musculoskeletal:        General: Normal range of motion.     Cervical back: Normal range of motion and neck supple.  Skin:    General: Skin is warm and dry.  Neurological:     General: No focal deficit present.     Mental Status: He is alert and oriented to person, place, and time.  Psychiatric:        Mood and Affect: Mood normal.        Behavior: Behavior normal.     Data Reviewed: Relevant notes from primary care and specialist visits,  past discharge summaries as available in EHR, including Care Everywhere. Prior diagnostic testing as pertinent to current admission diagnoses Updated medications and problem lists for reconciliation ED course, including vitals, labs, imaging, treatment and response to treatment Triage notes, nursing and pharmacy notes and ED provider's notes Notable results as noted in HPI Labs reviewed.  Sodium 140, potassium 4.6, chloride 105, bicarb 25, glucose 98, BUN 16, creatinine 1.02, calcium 9.8, total protein 7.7, albumin 4.2, AST 22, ALT 25, alkaline phosphatase 94, total bilirubin 1.3, white count 9.3, hemoglobin 17.4, hematocrit 52.1, platelet count 277 CT angiogram of the head and neck/CT venogram showed no acute intracranial pathology.Remote  infarcts in the right MCA distribution.Patent vasculature of the head and neck with no hemodynamically significant stenosis or occlusion.No evidence of venous thrombosis. There are no new results to review at this time.  Assessment and Plan: * Visual loss, left eye Patient presents for evaluation of intermittent visual loss initially involving his left eye but now involves both eyes associated with blurred vision. Concern for possible GCA Obtain ESR and CRP (per patient were done as an outpatient and were normal) Obtain MRI of the brain to rule out an acute stroke Start patient on high-dose systemic steroids Consult surgery for temporal artery biopsy  Essential hypertension Blood pressure is stable on Cardizem  Atrial fibrillation (HCC) Continue diltiazem for rate control Hold Eliquis for planned procedure      Advance Care Planning:   Code Status: Full Code   Consults: Surgery  Family Communication: Greater than 50% of time was spent discussing patient's condition and plan of care with him and his wife at the bedside.  All questions and concerns have been addressed.  They verbalized understanding and agree with the plan.  Severity of  Illness: The appropriate patient status for this patient is OBSERVATION. Observation status is judged to be reasonable and necessary in order to provide the required intensity of service to ensure the patient's safety. The patient's presenting symptoms, physical exam findings, and initial radiographic and laboratory data in the context of their medical condition is felt to place them at decreased risk for further clinical deterioration. Furthermore, it is anticipated that the patient will be medically stable for discharge from the hospital within 2 midnights of admission.   Author: Collier Bullock, MD 12/06/2021 5:32 PM  For on call review [email or web address].

## 2021-12-07 ENCOUNTER — Observation Stay (HOSPITAL_COMMUNITY): Payer: Medicare Other

## 2021-12-07 ENCOUNTER — Ambulatory Visit (HOSPITAL_COMMUNITY): Payer: Medicare Other

## 2021-12-07 DIAGNOSIS — I10 Essential (primary) hypertension: Secondary | ICD-10-CM | POA: Diagnosis present

## 2021-12-07 DIAGNOSIS — H469 Unspecified optic neuritis: Secondary | ICD-10-CM | POA: Diagnosis not present

## 2021-12-07 DIAGNOSIS — Z888 Allergy status to other drugs, medicaments and biological substances status: Secondary | ICD-10-CM | POA: Diagnosis not present

## 2021-12-07 DIAGNOSIS — Z91018 Allergy to other foods: Secondary | ICD-10-CM | POA: Diagnosis not present

## 2021-12-07 DIAGNOSIS — Z20822 Contact with and (suspected) exposure to covid-19: Secondary | ICD-10-CM | POA: Diagnosis present

## 2021-12-07 DIAGNOSIS — Z8673 Personal history of transient ischemic attack (TIA), and cerebral infarction without residual deficits: Secondary | ICD-10-CM | POA: Diagnosis not present

## 2021-12-07 DIAGNOSIS — Z7401 Bed confinement status: Secondary | ICD-10-CM | POA: Diagnosis not present

## 2021-12-07 DIAGNOSIS — R9389 Abnormal findings on diagnostic imaging of other specified body structures: Secondary | ICD-10-CM | POA: Diagnosis not present

## 2021-12-07 DIAGNOSIS — H538 Other visual disturbances: Secondary | ICD-10-CM | POA: Diagnosis not present

## 2021-12-07 DIAGNOSIS — G500 Trigeminal neuralgia: Secondary | ICD-10-CM | POA: Diagnosis not present

## 2021-12-07 DIAGNOSIS — Z0389 Encounter for observation for other suspected diseases and conditions ruled out: Secondary | ICD-10-CM | POA: Diagnosis not present

## 2021-12-07 DIAGNOSIS — H539 Unspecified visual disturbance: Secondary | ICD-10-CM | POA: Diagnosis not present

## 2021-12-07 DIAGNOSIS — G453 Amaurosis fugax: Secondary | ICD-10-CM | POA: Diagnosis present

## 2021-12-07 DIAGNOSIS — M316 Other giant cell arteritis: Secondary | ICD-10-CM | POA: Diagnosis present

## 2021-12-07 DIAGNOSIS — R739 Hyperglycemia, unspecified: Secondary | ICD-10-CM | POA: Diagnosis not present

## 2021-12-07 DIAGNOSIS — D6869 Other thrombophilia: Secondary | ICD-10-CM | POA: Diagnosis present

## 2021-12-07 DIAGNOSIS — R509 Fever, unspecified: Secondary | ICD-10-CM

## 2021-12-07 DIAGNOSIS — Z7901 Long term (current) use of anticoagulants: Secondary | ICD-10-CM | POA: Diagnosis not present

## 2021-12-07 DIAGNOSIS — Z8611 Personal history of tuberculosis: Secondary | ICD-10-CM | POA: Diagnosis not present

## 2021-12-07 DIAGNOSIS — R519 Headache, unspecified: Secondary | ICD-10-CM

## 2021-12-07 DIAGNOSIS — E7800 Pure hypercholesterolemia, unspecified: Secondary | ICD-10-CM | POA: Diagnosis present

## 2021-12-07 DIAGNOSIS — H532 Diplopia: Secondary | ICD-10-CM | POA: Diagnosis present

## 2021-12-07 DIAGNOSIS — T380X5A Adverse effect of glucocorticoids and synthetic analogues, initial encounter: Secondary | ICD-10-CM | POA: Diagnosis not present

## 2021-12-07 DIAGNOSIS — H5462 Unqualified visual loss, left eye, normal vision right eye: Secondary | ICD-10-CM | POA: Diagnosis not present

## 2021-12-07 DIAGNOSIS — Z79899 Other long term (current) drug therapy: Secondary | ICD-10-CM | POA: Diagnosis not present

## 2021-12-07 DIAGNOSIS — H547 Unspecified visual loss: Secondary | ICD-10-CM | POA: Diagnosis not present

## 2021-12-07 DIAGNOSIS — Z8546 Personal history of malignant neoplasm of prostate: Secondary | ICD-10-CM | POA: Diagnosis not present

## 2021-12-07 DIAGNOSIS — I4819 Other persistent atrial fibrillation: Secondary | ICD-10-CM | POA: Diagnosis present

## 2021-12-07 DIAGNOSIS — I4891 Unspecified atrial fibrillation: Secondary | ICD-10-CM | POA: Diagnosis not present

## 2021-12-07 DIAGNOSIS — I4821 Permanent atrial fibrillation: Secondary | ICD-10-CM | POA: Diagnosis not present

## 2021-12-07 DIAGNOSIS — G588 Other specified mononeuropathies: Secondary | ICD-10-CM | POA: Diagnosis present

## 2021-12-07 DIAGNOSIS — Z981 Arthrodesis status: Secondary | ICD-10-CM | POA: Diagnosis not present

## 2021-12-07 DIAGNOSIS — Z9079 Acquired absence of other genital organ(s): Secondary | ICD-10-CM | POA: Diagnosis not present

## 2021-12-07 LAB — BASIC METABOLIC PANEL
Anion gap: 10 (ref 5–15)
BUN: 15 mg/dL (ref 8–23)
CO2: 22 mmol/L (ref 22–32)
Calcium: 9.1 mg/dL (ref 8.9–10.3)
Chloride: 105 mmol/L (ref 98–111)
Creatinine, Ser: 0.96 mg/dL (ref 0.61–1.24)
GFR, Estimated: >60 mL/min (ref >60)
Glucose, Bld: 165 mg/dL — ABNORMAL HIGH (ref 70–99)
Potassium: 3.8 mmol/L (ref 3.5–5.1)
Sodium: 137 mmol/L (ref 135–145)

## 2021-12-07 LAB — CBC
HCT: 49 % (ref 39.0–52.0)
Hemoglobin: 17.1 g/dL — ABNORMAL HIGH (ref 13.0–17.0)
MCH: 31.1 pg (ref 26.0–34.0)
MCHC: 34.9 g/dL (ref 30.0–36.0)
MCV: 89.1 fL (ref 80.0–100.0)
Platelets: 259 10*3/uL (ref 150–400)
RBC: 5.5 MIL/uL (ref 4.22–5.81)
RDW: 12.4 % (ref 11.5–15.5)
WBC: 6.1 10*3/uL (ref 4.0–10.5)
nRBC: 0 % (ref 0.0–0.2)

## 2021-12-07 LAB — GLUCOSE, CAPILLARY
Glucose-Capillary: 234 mg/dL — ABNORMAL HIGH (ref 70–99)
Glucose-Capillary: 151 mg/dL — ABNORMAL HIGH (ref 70–99)

## 2021-12-07 LAB — RAPID HIV SCREEN (HIV 1/2 AB+AG)
HIV 1/2 Antibodies: NONREACTIVE
HIV-1 P24 Antigen - HIV24: NONREACTIVE

## 2021-12-07 MED ORDER — SODIUM CHLORIDE 0.9 % IV SOLN
500.0000 mg | Freq: Two times a day (BID) | INTRAVENOUS | Status: AC
  Administered 2021-12-07 – 2021-12-09 (×5): 500 mg via INTRAVENOUS
  Filled 2021-12-07 (×5): qty 8

## 2021-12-07 MED ORDER — SODIUM CHLORIDE 0.9 % IV SOLN
1000.0000 mg | INTRAVENOUS | Status: DC
  Filled 2021-12-07: qty 16

## 2021-12-07 MED ORDER — PANTOPRAZOLE SODIUM 40 MG IV SOLR
40.0000 mg | INTRAVENOUS | Status: DC
  Administered 2021-12-07 – 2021-12-08 (×3): 40 mg via INTRAVENOUS
  Filled 2021-12-07 (×3): qty 10

## 2021-12-07 MED ORDER — INSULIN ASPART 100 UNIT/ML IJ SOLN
0.0000 [IU] | Freq: Three times a day (TID) | INTRAMUSCULAR | Status: DC
  Administered 2021-12-07: 3 [IU] via SUBCUTANEOUS
  Administered 2021-12-08 (×3): 2 [IU] via SUBCUTANEOUS
  Administered 2021-12-09 (×2): 3 [IU] via SUBCUTANEOUS
  Administered 2021-12-09: 2 [IU] via SUBCUTANEOUS

## 2021-12-07 MED ORDER — INSULIN ASPART 100 UNIT/ML IJ SOLN
0.0000 [IU] | Freq: Every day | INTRAMUSCULAR | Status: DC
  Administered 2021-12-08: 3 [IU] via SUBCUTANEOUS

## 2021-12-07 NOTE — Progress Notes (Signed)
  Progress Note   Patient: Patrick Salinas PJA:[id number] DOB: 1950/07/16 DOA: 12/06/2021     0 DOS: the patient was seen and examined on 12/07/2021   Brief hospital course:  Assessment and Plan: *Intermittent visual loss, left eye --Etiology unclear, appreciate vascular surgery, neurology and ophthalmology involvement.  ESR and CRP normal and no classic findings to suggest temporal arteritis.  Extensive imaging concerning for optic perineuritis a rare phenomenon.  Can be idiopathic or from multiple discrete causes. -- As per neurology, plan for temporal artery biopsy, high-dose steroids and lumbar puncture.  Essential hypertension --Stable.  Atrial fibrillation ([place]) --Stable.  Continue diltiazem for rate control --Continue to hold Eliquis for planned procedures     Subjective:  Feels ok right now, vision ok right now  Physical Exam: Vitals:   12/06/21 2220 12/07/21 0000 12/07/21 0400 12/07/21 1156  BP: (!) 136/98 (!) 121/93 (!) 126/93 (!) 143/97  Pulse: (!) 104   100  Resp:    18  Temp: 98.8 F (37.1 C) 98.8 F (37.1 C) 98.7 F (37.1 C) 98 F (36.7 C)  TempSrc: Oral Oral Oral   SpO2: 98%   97%  Weight:      Height:       Physical Exam Vitals reviewed.  Constitutional:      General: He is not in acute distress.    Appearance: He is not ill-appearing or toxic-appearing.  Eyes:     General: No scleral icterus.    Pupils: Pupils are equal, round, and reactive to light.  Cardiovascular:     Rate and Rhythm: Tachycardia present. Rhythm irregular.     Heart sounds: No murmur heard.    Comments: Telemetry afib Pulmonary:     Effort: Pulmonary effort is normal.     Breath sounds: No wheezing or rales.  Musculoskeletal:     Right lower leg: No edema.     Left lower leg: No edema.  Neurological:     Mental Status: He is alert.  Psychiatric:        Mood and Affect: Mood normal.        Behavior: Behavior normal.     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

## 2021-12-07 NOTE — Progress Notes (Addendum)
  Progress Note    12/07/2021 9:29 AM * No surgery date entered *  Subjective: No complaints    Vitals:   12/07/21 0000 12/07/21 0400  BP: (!) 121/93 (!) 126/93  Pulse:    Resp:    Temp: 98.8 F (37.1 C) 98.7 F (37.1 C)  SpO2:      Physical Exam: Lungs: Nonlabored Extremities: Bilateral upper and lower extremities well-perfused Abdomen: Soft and nontender Neuro: A&O x3  CBC    Component Value Date/Time   WBC 6.1 12/07/2021 0331   RBC 5.50 12/07/2021 0331   HGB 17.1 (H) 12/07/2021 0331   HCT 49.0 12/07/2021 0331   PLT 259 12/07/2021 0331   MCV 89.1 12/07/2021 0331   MCH 31.1 12/07/2021 0331   MCHC 34.9 12/07/2021 0331   RDW 12.4 12/07/2021 0331   LYMPHSABS 1.8 12/06/2021 1201   MONOABS 0.9 12/06/2021 1201   EOSABS 0.1 12/06/2021 1201   BASOSABS 0.1 12/06/2021 1201    BMET    Component Value Date/Time   NA 137 12/07/2021 0331   K 3.8 12/07/2021 0331   CL 105 12/07/2021 0331   CO2 22 12/07/2021 0331   GLUCOSE 165 (H) 12/07/2021 0331   BUN 15 12/07/2021 0331   CREATININE 0.96 12/07/2021 0331   CALCIUM 9.1 12/07/2021 0331   GFRNONAA >60 12/07/2021 0331   GFRAA >60 04/06/2019 1612    INR    Component Value Date/Time   INR 1.06 05/17/2018 1340     Intake/Output Summary (Last 24 hours) at 12/07/2021 0929 Last data filed at 12/07/2021 0809 Gross per 24 hour  Intake 58 ml  Output 500 ml  Net -442 ml     Assessment/Plan:  71 y.o. male with bilateral vision changes and headache   -Plan has been discussed with neurology.  Patient will proceed with ultrasound study of temporal artery to investigate for signs of arteritis.  If ultrasound study is negative, we will proceed with left sided temporal artery biopsy on 9/14 with Dr. Donzetta Matters.  If ultrasound study is positive, the patient will not require biopsy -Holding Eliquis -Patient has consented to the following plan and all questions have been answered.  Patient will be n.p.o. past midnight  Vicente Serene, [facility] [phone] 12/07/2021 9:29 AM   I have interviewed and examined patient with PA and agree with assessment and plan above.  Plan will be to get ultrasound today and tentatively plan for left temporal artery biopsy in the OR with me tomorrow if it is negative.  Continue to hold Eliquis.  Yancey Pedley C. Donzetta Matters, MD [facility] [place] Office: [phone] Pager: [phone]

## 2021-12-07 NOTE — Progress Notes (Signed)
  Transition of Care [facility]) Screening Note   Patient Details  Name: SAMEER TEEPLE Date of Birth: 1950/11/13   Transition of Care [facility]) CM/SW Contact:    Pollie Friar, RN Phone Number: 12/07/2021, 3:33 PM   Pt is from home with spouse.  Transition of Care Department [facility]) has reviewed patient and no TOC needs have been identified at this time. We will continue to monitor patient advancement through interdisciplinary progression rounds. If new patient transition needs arise, please place a TOC consult.

## 2021-12-07 NOTE — Consult Note (Signed)
  Ophthalmology Update Note  Aquil, Duhe, 71 y.o. 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 DDx is broad includes: GCA, TB, Syphilis, Sarcoidosis, B-Cell Lymphoma, GPA, IBD, SLE, Behcet, IgG4, et al. Consider a laboratory work-up for the above diagnoses. Also, consider a lumbar puncture to rule out CNS infection or malignancy. Would proceed with TABx despite normal inflammatory markers. Treatment consists of high dose systemic steroids with a slow taper. Will continue to follow peripherally. Call with any questions: [phone] (cell)  Midge Aver, MD 12/07/2021, 8:51 AM

## 2021-12-07 NOTE — Progress Notes (Signed)
Bilateral temporal artery duplex study completed.   Please see CV Proc for preliminary results.   Darlin Coco, RDMS, RVT

## 2021-12-07 NOTE — Progress Notes (Addendum)
Neurology progress note  S: Patient is feeling well, has not had any further episodes of transient vision loss. Blurry vision has resolved. D/w ophtho today Patrick Salinas who reviewed the MRI and felt that his extraocular muscles looked normal but that he did have evidence of optic perineuritis.  MRI brain and orbits wwo contrast personal review -ill-defined irregular enhancement of the fat around the optic nerves bilaterally.  Extraocular muscles have normal volume but do have some mild associated enhancement.  There is a 3 mm focus of contrast-enhancement in the left IAC fundus, potentially a small vestibular schwannoma.  He also has evidence of remote right MCA territory infarcts.  O:  Vitals:   12/07/21 0400 12/07/21 1156  BP: (!) 126/93 (!) 143/97  Pulse:  100  Resp:  18  Temp: 98.7 F (37.1 C) 98 F (36.7 C)  SpO2:  97%    Physical Exam Gen: A&Ox4, NAD HEENT: Atraumatic, normocephalic; oropharynx clear, tongue without atrophy or fasciculations. Resp: CTAB, normal work of breathing CV: RRR, extremities appear well-perfused. Abd: soft/NT/ND Extrem: Nml bulk; no cyanosis, clubbing, or edema.  Neuro: *MS: A&O x4. Follows multi-step commands.  *Speech: no dysarthria or aphasia, able to name and repeat. *CN:    I: Deferred   II,III: PERRLA, VFF by confrontation, optic discs not visualized 2/2 pupillary constriction   III,IV,VI: EOMI w/o nystagmus, no ptosis   V: Sensation intact from V1 to V3 to LT   VII: Eyelid closure was full.  Smile symmetric.   VIII: Hearing intact to voice   IX,X: Voice normal, palate elevates symmetrically    XI: SCM/trap 5/5 bilat   XII: Tongue protrudes midline, no atrophy or fasciculations  *Motor:   Normal bulk.  No tremor, rigidity or bradykinesia. No pronator drift.   Strength: Dlt Bic Tri WE WrF FgS Gr HF KnF KnE PlF DoF    Left '5 5 5 5 5 5 5 5 5 5 5 5    ' Right '5 5 5 5 5 5 5 5 5 5 5 5   ' *Sensory: Intact to light touch, pinprick, temperature  vibration throughout. Symmetric. Propioception intact bilat.  No double-simultaneous extinction.  *Coordination:  Finger-to-nose, heel-to-shin, rapid alternating motions were intact. 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   Clinical presentation is not classic for GCA but certainly still on the differential. Symptoms concerning for GCA include reported fatigue, 20+lbs weight loss, fever, transient/brief vision loss and blurry vision and bitemporal and frontal headahce. Still his ESR and CRP are normal, no jaw claudication, no muscle aches concerning for PMR, no scalp tenderness. CTV neg for venous sinus thrombosis. D/w Patrick Salinas of ophtho today who reviewed the MRI and feels it is most c/w optic perineuritis. This is a rare phenomenon; most cases are idiopathic but it can also be associated with multiple disorders including: Sarcoidosis, IgG4 related disease, granulomatosis with polyangiitis, GCA, Behcet's disease, lupus, inflammatory bowel disease, syphilis, tuberculosis, HSP, herpes zoster, other viral encephalitides, leukemia, and primary or metastatic malignancy.  He is going to undergo a temporal artery ultrasound today.  If those findings are consistent with  GCA then we will continue high-dose steroids as scheduled.  If there is no evidence of GCA on temporal artery ultrasound then vascular we will proceed with a temporal artery biopsy tomorrow and continue steroids while awaiting results.  I have ordered the additional serum work-up below based on MRI findings and he will also  require lumbar puncture.  Neurology will perform this, will plan for FRIDAY (after his temporal artery biopsy if this is required). He will need a relatively high-volume tap therefore would prefer to wait 72 hours after last dose of eliquis.    - Continue solumedrol 538m q 12 hrs for at least 3 days (divided into 2 daily doses per patient request), start date 9/12. IVMP will be followed by 628mprednisone daily pending further workup for possible GCA - Protonix 4029mV daily while on high dose steroids - [facility] send out Immunoglobulin G, Subclass 4, Serum TEST ID [id number] with reflex - ANCA - RPR - Quantiferon gold - Above labs all ordered - LP tentatively Friday AM. Will need the following CSF labs: cell count w/ diff x2 tubes, glucose, protein, gram stain, culture, biofire meningitis/encephalitis panel, flow cytometry, cytology, VDRL, acid-fast bacilli smear and culture - CXR given sarcoid is on the differential. Will hold off on serum ACE for now given poor sensitivity and specificity. - Continue to hold eliquis while awaiting LP - F/u temporal artery [place] continue to follow.  ColSu MonksD [facility] [phone] 7pm- 7am, please page neurology [place] call [place]

## 2021-12-08 ENCOUNTER — Encounter (HOSPITAL_COMMUNITY): Payer: Self-pay | Admitting: Family Medicine

## 2021-12-08 ENCOUNTER — Inpatient Hospital Stay (HOSPITAL_COMMUNITY): Payer: Medicare Other | Admitting: Certified Registered Nurse Anesthetist

## 2021-12-08 ENCOUNTER — Inpatient Hospital Stay (HOSPITAL_COMMUNITY): Payer: Medicare Other

## 2021-12-08 ENCOUNTER — Encounter (HOSPITAL_COMMUNITY)
Admission: EM | Disposition: A | Discharge status: Home / Self Care | Payer: Self-pay | Source: Home / Self Care | Attending: Family Medicine

## 2021-12-08 ENCOUNTER — Other Ambulatory Visit: Payer: Self-pay

## 2021-12-08 DIAGNOSIS — I4821 Permanent atrial fibrillation: Secondary | ICD-10-CM | POA: Diagnosis not present

## 2021-12-08 DIAGNOSIS — H469 Unspecified optic neuritis: Secondary | ICD-10-CM | POA: Diagnosis not present

## 2021-12-08 DIAGNOSIS — I10 Essential (primary) hypertension: Secondary | ICD-10-CM

## 2021-12-08 DIAGNOSIS — I4891 Unspecified atrial fibrillation: Secondary | ICD-10-CM

## 2021-12-08 DIAGNOSIS — R9389 Abnormal findings on diagnostic imaging of other specified body structures: Secondary | ICD-10-CM | POA: Diagnosis not present

## 2021-12-08 DIAGNOSIS — M316 Other giant cell arteritis: Secondary | ICD-10-CM | POA: Diagnosis not present

## 2021-12-08 DIAGNOSIS — H5462 Unqualified visual loss, left eye, normal vision right eye: Secondary | ICD-10-CM | POA: Diagnosis not present

## 2021-12-08 DIAGNOSIS — H539 Unspecified visual disturbance: Secondary | ICD-10-CM

## 2021-12-08 DIAGNOSIS — Z8673 Personal history of transient ischemic attack (TIA), and cerebral infarction without residual deficits: Secondary | ICD-10-CM | POA: Diagnosis not present

## 2021-12-08 HISTORY — PX: ARTERY BIOPSY: SHX891

## 2021-12-08 LAB — MENINGITIS/ENCEPHALITIS PANEL (CSF)

## 2021-12-08 LAB — CSF CELL COUNT WITH DIFFERENTIAL
RBC Count, CSF: 805 /mm3 — ABNORMAL HIGH
RBC Count, CSF: 89 /mm3 — ABNORMAL HIGH
Tube #: 1
Tube #: 4
WBC, CSF: 1 /mm3 (ref 0–5)
WBC, CSF: 2 /mm3 (ref 0–5)

## 2021-12-08 LAB — GLUCOSE, CAPILLARY
Glucose-Capillary: 197 mg/dL — ABNORMAL HIGH (ref 70–99)
Glucose-Capillary: 174 mg/dL — ABNORMAL HIGH (ref 70–99)
Glucose-Capillary: 151 mg/dL — ABNORMAL HIGH (ref 70–99)
Glucose-Capillary: 263 mg/dL — ABNORMAL HIGH (ref 70–99)

## 2021-12-08 LAB — PROTEIN, CSF: Total  Protein, CSF: 76 mg/dL — ABNORMAL HIGH (ref 15–45)

## 2021-12-08 LAB — ANA W/REFLEX IF POSITIVE: Anti Nuclear Antibody (ANA): NEGATIVE

## 2021-12-08 LAB — ANCA TITERS
Atypical P-ANCA titer: <1:20 {titer}
C-ANCA: <1:20 {titer}
P-ANCA: <1:20 {titer}

## 2021-12-08 LAB — GLUCOSE, CSF: Glucose, CSF: 103 mg/dL — ABNORMAL HIGH (ref 40–70)

## 2021-12-08 LAB — RPR: RPR Ser Ql: NONREACTIVE

## 2021-12-08 SURGERY — BIOPSY TEMPORAL ARTERY
Anesthesia: General | Site: Head | Laterality: Left

## 2021-12-08 MED ORDER — LACTATED RINGERS IV SOLN: INTRAVENOUS | Status: DC | PRN

## 2021-12-08 MED ORDER — FENTANYL CITRATE (PF) 100 MCG/2ML IJ SOLN
25.0000 ug | INTRAMUSCULAR | Status: DC | PRN
  Administered 2021-12-08 (×2): 50 ug via INTRAVENOUS

## 2021-12-08 MED ORDER — CEFAZOLIN SODIUM 1 G IJ SOLR
INTRAMUSCULAR | Status: AC
  Filled 2021-12-08: qty 20

## 2021-12-08 MED ORDER — FENTANYL CITRATE (PF) 250 MCG/5ML IJ SOLN
INTRAMUSCULAR | Status: DC | PRN
  Administered 2021-12-08: 25 ug via INTRAVENOUS

## 2021-12-08 MED ORDER — SODIUM CHLORIDE 0.9 % IR SOLN
Status: DC | PRN
  Administered 2021-12-08: 1

## 2021-12-08 MED ORDER — LACTATED RINGERS IV SOLN: INTRAVENOUS | Status: DC

## 2021-12-08 MED ORDER — PROPOFOL 10 MG/ML IV BOLUS
INTRAVENOUS | Status: AC
  Filled 2021-12-08: qty 20

## 2021-12-08 MED ORDER — LIDOCAINE 2% (20 MG/ML) 5 ML SYRINGE
INTRAMUSCULAR | Status: DC | PRN
  Administered 2021-12-08: 100 mg via INTRAVENOUS

## 2021-12-08 MED ORDER — PHENYLEPHRINE 80 MCG/ML (10ML) SYRINGE FOR IV PUSH (FOR BLOOD PRESSURE SUPPORT)
PREFILLED_SYRINGE | INTRAVENOUS | Status: DC | PRN
  Administered 2021-12-08 (×2): 160 ug via INTRAVENOUS

## 2021-12-08 MED ORDER — PROMETHAZINE HCL 25 MG/ML IJ SOLN: 6.2500 mg | INTRAMUSCULAR | Status: DC | PRN

## 2021-12-08 MED ORDER — MIDAZOLAM HCL 2 MG/2ML IJ SOLN
INTRAMUSCULAR | Status: AC
  Filled 2021-12-08: qty 2

## 2021-12-08 MED ORDER — SODIUM CHLORIDE 0.9 % IV SOLN: INTRAVENOUS | Status: DC

## 2021-12-08 MED ORDER — ORAL CARE MOUTH RINSE: 15.0000 mL | Freq: Once | OROMUCOSAL | Status: AC

## 2021-12-08 MED ORDER — CHLORHEXIDINE GLUCONATE 0.12 % MT SOLN: 15.0000 mL | Freq: Once | OROMUCOSAL | Status: AC

## 2021-12-08 MED ORDER — ONDANSETRON HCL 4 MG/2ML IJ SOLN
INTRAMUSCULAR | Status: AC
  Filled 2021-12-08: qty 2

## 2021-12-08 MED ORDER — CHLORHEXIDINE GLUCONATE 0.12 % MT SOLN
OROMUCOSAL | Status: AC
  Administered 2021-12-08: 15 mL via OROMUCOSAL
  Filled 2021-12-08: qty 15

## 2021-12-08 MED ORDER — FENTANYL CITRATE (PF) 250 MCG/5ML IJ SOLN
INTRAMUSCULAR | Status: AC
  Filled 2021-12-08: qty 5

## 2021-12-08 MED ORDER — DEXAMETHASONE SODIUM PHOSPHATE 10 MG/ML IJ SOLN
INTRAMUSCULAR | Status: DC | PRN
  Administered 2021-12-08: 4 mg via INTRAVENOUS

## 2021-12-08 MED ORDER — FENTANYL CITRATE (PF) 100 MCG/2ML IJ SOLN
INTRAMUSCULAR | Status: AC
  Filled 2021-12-08: qty 2

## 2021-12-08 MED ORDER — DEXAMETHASONE SODIUM PHOSPHATE 10 MG/ML IJ SOLN
INTRAMUSCULAR | Status: AC
  Filled 2021-12-08: qty 1

## 2021-12-08 MED ORDER — LIDOCAINE HCL (PF) 1 % IJ SOLN
5.0000 mL | Freq: Once | INTRAMUSCULAR | Status: AC
  Administered 2021-12-08: 4 mL

## 2021-12-08 MED ORDER — PROPOFOL 10 MG/ML IV BOLUS
INTRAVENOUS | Status: DC | PRN
  Administered 2021-12-08: 160 mg via INTRAVENOUS

## 2021-12-08 MED ORDER — MIDAZOLAM HCL 2 MG/2ML IJ SOLN
INTRAMUSCULAR | Status: DC | PRN
  Administered 2021-12-08: 2 mg via INTRAVENOUS

## 2021-12-08 MED ORDER — ONDANSETRON HCL 4 MG/2ML IJ SOLN
INTRAMUSCULAR | Status: DC | PRN
  Administered 2021-12-08: 4 mg via INTRAVENOUS

## 2021-12-08 SURGICAL SUPPLY — 42 items
BAG COUNTER SPONGE SURGICOUNT (BAG) ×1 IMPLANT
CANISTER SUCT 3000ML PPV (MISCELLANEOUS) ×1 IMPLANT
CLIP LIGATING EXTRA MED SLVR (CLIP) ×1 IMPLANT
CLIP LIGATING EXTRA SM BLUE (MISCELLANEOUS) ×1 IMPLANT
CNTNR URN SCR LID CUP LEK RST (MISCELLANEOUS) ×1 IMPLANT
CONT SPEC 4OZ STRL OR WHT (MISCELLANEOUS) ×1
COTTONBALL LRG STERILE PKG (GAUZE/BANDAGES/DRESSINGS) ×1 IMPLANT
COVER SURGICAL LIGHT HANDLE (MISCELLANEOUS) ×1 IMPLANT
DERMABOND IMPLANT
DERMABOND ADVANCED .7 DNX12 (GAUZE/BANDAGES/DRESSINGS) ×1 IMPLANT
DRAPE OPHTHALMIC 77X100 STRL (CUSTOM PROCEDURE TRAY) ×1 IMPLANT
ELECT NDL BLADE 2-5/6 (NEEDLE) ×1 IMPLANT
ELECT NEEDLE BLADE 2-5/6 (NEEDLE) ×1 IMPLANT
ELECT REM PT RETURN 9FT ADLT (ELECTROSURGICAL) ×1
ELECTRODE REM PT RTRN 9FT ADLT (ELECTROSURGICAL) ×1 IMPLANT
GAUZE 4X4 16PLY ~~LOC~~+RFID DBL (SPONGE) ×1 IMPLANT
GEL ULTRASOUND 8.5O AQUASONIC (MISCELLANEOUS) ×1 IMPLANT
GLOVE BIO SURGEON STRL SZ7.5 (GLOVE) ×1 IMPLANT
GOWN STRL REUS W/ TWL LRG LVL3 (GOWN DISPOSABLE) ×1 IMPLANT
GOWN STRL REUS W/ TWL XL LVL3 (GOWN DISPOSABLE) ×1 IMPLANT
GOWN STRL REUS W/TWL LRG LVL3 (GOWN DISPOSABLE) ×1
GOWN STRL REUS W/TWL XL LVL3 (GOWN DISPOSABLE) ×1
KIT BASIN OR (CUSTOM PROCEDURE TRAY) ×1 IMPLANT
KIT TURNOVER KIT B (KITS) ×1 IMPLANT
LOOP VESSEL MINI RED (MISCELLANEOUS) ×1 IMPLANT
NDL HYPO 25GX1X1/2 BEV (NEEDLE) ×1 IMPLANT
NEEDLE HYPO 25GX1X1/2 BEV (NEEDLE) ×1 IMPLANT
NS IRRIG 1000ML POUR BTL (IV SOLUTION) ×1 IMPLANT
PACK GENERAL/GYN (CUSTOM PROCEDURE TRAY) ×1 IMPLANT
PAD ARMBOARD 7.5X6 YLW CONV (MISCELLANEOUS) ×2 IMPLANT
SPIKE FLUID TRANSFER (MISCELLANEOUS) ×1 IMPLANT
SUCTION FRAZIER HANDLE 10FR (MISCELLANEOUS) ×1
SUCTION TUBE FRAZIER 10FR DISP (MISCELLANEOUS) ×1 IMPLANT
SUT MNCRL AB 4-0 PS2 18 (SUTURE) ×1 IMPLANT
SUT PROLENE 6 0 BV (SUTURE) IMPLANT
SUT SILK 3 0 (SUTURE)
SUT SILK 3-0 18XBRD TIE 12 (SUTURE) IMPLANT
SUT VIC AB 3-0 SH 27 (SUTURE) ×1
SUT VIC AB 3-0 SH 27X BRD (SUTURE) ×1 IMPLANT
SYR CONTROL 10ML LL (SYRINGE) ×1 IMPLANT
TOWEL GREEN STERILE (TOWEL DISPOSABLE) ×1 IMPLANT
WATER STERILE IRR 1000ML POUR (IV SOLUTION) ×1 IMPLANT

## 2021-12-08 NOTE — Progress Notes (Signed)
  Progress Note    12/08/2021 8:41 AM Day of Surgery  Subjective:  vision has improved  Vitals:   12/08/21 0407 12/08/21 0748  BP: 113/73 125/85  Pulse: 82 81  Resp: 19   Temp: 98.6 F (37 C) (!) 97.5 F (36.4 C)  SpO2: 97% 96%    Physical Exam: Aaox3 Strong left temporal pulse  CBC    Component Value Date/Time   WBC 6.1 12/07/2021 0331   RBC 5.50 12/07/2021 0331   HGB 17.1 (H) 12/07/2021 0331   HCT 49.0 12/07/2021 0331   PLT 259 12/07/2021 0331   MCV 89.1 12/07/2021 0331   MCH 31.1 12/07/2021 0331   MCHC 34.9 12/07/2021 0331   RDW 12.4 12/07/2021 0331   LYMPHSABS 1.8 12/06/2021 1201   MONOABS 0.9 12/06/2021 1201   EOSABS 0.1 12/06/2021 1201   BASOSABS 0.1 12/06/2021 1201    BMET    Component Value Date/Time   NA 137 12/07/2021 0331   K 3.8 12/07/2021 0331   CL 105 12/07/2021 0331   CO2 22 12/07/2021 0331   GLUCOSE 165 (H) 12/07/2021 0331   BUN 15 12/07/2021 0331   CREATININE 0.96 12/07/2021 0331   CALCIUM 9.1 12/07/2021 0331   GFRNONAA >60 12/07/2021 0331   GFRAA >60 04/06/2019 1612    INR    Component Value Date/Time   INR 1.06 05/17/2018 1340     Intake/Output Summary (Last 24 hours) at 12/08/2021 0841 Last data filed at 12/08/2021 0413 Gross per 24 hour  Intake 58 ml  Output 600 ml  Net -542 ml     Assessment:  71 y.o. male is here with concern for temporal arteritis  Plan: OR today for left temporal artery biopsy   Patrick Beverley C. Donzetta Matters, MD [facility] [place] Office: [phone] Pager: [phone]  12/08/2021 8:41 AM

## 2021-12-08 NOTE — Progress Notes (Signed)
Returned from radiology post LP.  Pt understands need to remain flat.

## 2021-12-08 NOTE — Progress Notes (Signed)
To radiology at this time for LP.

## 2021-12-08 NOTE — Anesthesia Preprocedure Evaluation (Addendum)
Anesthesia Evaluation  Patient identified by MRN, date of birth, ID band Patient awake    Reviewed: Allergy & Precautions, NPO status , Patient's Chart, lab work & pertinent test results  History of Anesthesia Complications Negative for: history of anesthetic complications  Airway Mallampati: II  TM Distance: >3 FB Neck ROM: Full    Dental  (+) Teeth Intact, Dental Advisory Given   Pulmonary neg pulmonary ROS,    Pulmonary exam normal breath sounds clear to auscultation       Cardiovascular hypertension, Pt. on medications + dysrhythmias Atrial Fibrillation + Valvular Problems/Murmurs (Mild MR and TR)  Rhythm:Irregular Rate:Normal  Echo 03/2019 1. Left ventricular ejection fraction, by visual estimation, is 55 to 60%. The left ventricle has normal function. There is no left ventricular hypertrophy.  2. Left ventricular diastolic function could not be evaluated.  3. The left ventricle has no regional wall motion abnormalities.  4. Global right ventricle has normal systolic function.The right ventricular size is normal.  5. Left atrial size was mildly dilated.  6. Right atrial size was mildly dilated.  7. The mitral valve is normal in structure. Mild mitral valve  regurgitation. No evidence of mitral stenosis.  8. The tricuspid valve is normal in structure. Tricuspid valve regurgitation is mild.  9. The aortic valve is tricuspid. Aortic valve regurgitation is not visualized. Mild aortic valve sclerosis without stenosis.  10. The pulmonic valve was normal in structure. Pulmonic valve regurgitation is not visualized.  11. Normal pulmonary artery systolic pressure.  12. The inferior vena cava is dilated in size with >50% respiratory variability, suggesting right atrial pressure of 8 mmHg.  13. Normal LV function; mild biatrial enlargement; mild MR and TR.    Neuro/Psych CVA    GI/Hepatic negative GI ROS, Neg liver ROS,    Endo/Other  negative endocrine ROS  Renal/GU negative Renal ROS     Musculoskeletal   Abdominal   Peds  Hematology negative hematology ROS (+)   Anesthesia Other Findings   Reproductive/Obstetrics                           Anesthesia Physical  Anesthesia Plan  ASA: 3  Anesthesia Plan: General   Post-op Pain Management: Ofirmev IV (intra-op)*   Induction: Intravenous  PONV Risk Score and Plan: 2 and Ondansetron, Dexamethasone and Treatment may vary due to age or medical condition  Airway Management Planned: LMA  Additional Equipment:   Intra-op Plan:   Post-operative Plan: Extubation in OR  Informed Consent: I have reviewed the patients History and Physical, chart, labs and discussed the procedure including the risks, benefits and alternatives for the proposed anesthesia with the patient or authorized representative who has indicated his/her understanding and acceptance.     Dental advisory given  Plan Discussed with: CRNA  Anesthesia Plan Comments:        Anesthesia Quick Evaluation

## 2021-12-08 NOTE — Progress Notes (Addendum)
  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  Admitted for empiric IV steroids and further investigation.  Followed by neurology, ophthalmology (remotely) and vascular surgery.  Status post temporal artery biopsy with results pending.  Status post lumbar puncture with results pending.  Assessment and Plan: *Intermittent visual loss, left eye --Etiology unclear, appreciate vascular surgery, neurology and ophthalmology involvement.  ESR and CRP normal and no classic findings to suggest temporal arteritis.  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  Can be idiopathic or from multiple discrete causes. -- Status post temporal artery biopsy 9/14 as well as lumbar puncture.  Studies pending.  Continue Solu-Medrol as per neurology. --No further visual events since initiation of steroids.   Essential hypertension -- Remains stable.   Persistent atrial fibrillation (HCC) -- Remains stable.  Continue diltiazem for rate control --Continue to hold Eliquis.  Peri-procedure      Subjective:  Feels ok, no visual complaints.  Physical Exam: Vitals:   12/08/21 1145 12/08/21 1150 12/08/21 1211 12/08/21 1542  BP: 121/83  122/83 118/74  Pulse: 83 86 90 96  Resp: _0 Temp:  (!) 97.2 F (36.2 C) (!) 97.5 F (36.4 C) 97.9 F (36.6 C)  TempSrc:   Oral Oral  SpO2: 93% 95% 96% 95%  Weight:      Height:       Physical Exam Vitals reviewed.  Constitutional:      General: He is not in acute distress.    Appearance: He is not ill-appearing or toxic-appearing.  Cardiovascular:     Rate and Rhythm: Normal rate and regular rhythm.     Heart sounds: No murmur heard. Pulmonary:     Effort: Pulmonary effort is normal. No respiratory distress.     Breath sounds:  No wheezing, rhonchi or rales.  Neurological:     Mental Status: He is alert.  Psychiatric:        Mood and Affect: Mood normal.        Behavior: Behavior normal.     Data Reviewed:  CBG stable  Family Communication: wife at bedside  Disposition: Status is: Inpatient Remains inpatient appropriate because: workup for visual disturbance  Planned Discharge Destination: Home    Time spent: 25 minutes  Author: Murray Hodgkins, MD 12/08/2021 5:21 PM  For on call review [email or web address].

## 2021-12-08 NOTE — Hospital Course (Signed)
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  Admitted for empiric IV steroids and further investigation.  Followed by neurology, ophthalmology (remotely) and vascular surgery.  Status post temporal artery biopsy with results pending.  Status post lumbar puncture with results pending.

## 2021-12-08 NOTE — Anesthesia Procedure Notes (Signed)
Procedure Name: LMA Insertion Date/Time: 12/08/2021 9:32 AM  Performed by: Inda Coke, CRNAPre-anesthesia Checklist: Patient identified, Emergency Drugs available, Suction available, Timeout performed and Patient being monitored Patient Re-evaluated:Patient Re-evaluated prior to induction Oxygen Delivery Method: Circle system utilized Preoxygenation: Pre-oxygenation with 100% oxygen Induction Type: IV induction Ventilation: Mask ventilation without difficulty LMA: LMA inserted LMA Size: 4.0 Tube type: Oral Placement Confirmation: positive ETCO2, CO2 detector and breath sounds checked- equal and bilateral Tube secured with: Tape Dental Injury: Teeth and Oropharynx as per pre-operative assessment

## 2021-12-08 NOTE — Op Note (Signed)
    Patient name: Patrick Salinas MRN: [id number] DOB: January 26, 1951 Sex: male  12/08/2021 Pre-operative Diagnosis: Concern for temporal arteritis Post-operative diagnosis:  Same Surgeon:  Erlene Quan C. Donzetta Matters, MD Procedure Performed: Left temporal artery biopsy  Indications: 71 year old male with recent history of left greater than right visual changes has been started on high dose steroids with resolution of the symptoms.  He does not have an elevated CRP or sed rate does have an ultrasound did not show any sign but was not conclusively negative for temporal arteritis.  He was medicated for pain prior to biopsy.  Findings: The temporal artery was removed to a total of 5 cm.   Procedure:  The patient was identified in the holding area and taken to the operating room where is placed supine the operative table and general anesthesia was induced.  He was sterilely prepped and draped in the left face in usual fashion, antibiotics were minister timeout was called.  We made a vertical incision anterior to the left ear where there was a palpable left temporal pulse that I had previously confirmed with Doppler.  We dissected down to the artery.  This was tied off distally and transected and allowed to bleed antegrade and then dissected back and tied off proximally and the artery was sent as a specimen.  The wound was irrigated and hemostasis was obtained we then closed with 4-0 Monocryl and Dermabond was placed at the skin level.  He was awakened from anesthesia having tolerated procedure without any complication.  All counts were correct at completion.  EBL: 20 cc   Rashidah Belleville C. Donzetta Matters, MD [facility] Office: [phone] Pager: [phone]

## 2021-12-08 NOTE — Procedures (Signed)
Technically successful fluoro guided LP at L3-4 level with opening pressure of 20 cm H2O a  20 cc of clear, colorless CSF sent to lab for analysis.  No immediate post procedural complication.  Bedrest with HOB flat x 12 hours, may sit up to eat x 10 minutes. Encourage PO fluids, caffeine if can tolerate.  Please see imaging section of Epic for full dictation.  Candiss Norse, PA-C

## 2021-12-08 NOTE — Progress Notes (Signed)
Pt returned from procedure at this time. He denies any pain or discomfort.  VSS Spouse at bedside.

## 2021-12-08 NOTE — Progress Notes (Addendum)
Neurology progress note  S: Patient is feeling well, has not had any further episodes of transient vision loss. Blurry vision has resolved. Temporal artery [place] prelim read neg for halo sign. Scheduled for temporal artery biopsy today at 11am with vascular. He does report a remote hx of Tb which was treated. He requests IR LP without bedside attempt.  MRI brain and orbits wwo contrast personal review -ill-defined irregular enhancement of the fat around the optic nerves bilaterally.  Extraocular muscles have normal volume but do have some mild associated enhancement.  There is a 3 mm focus of contrast-enhancement in the left IAC fundus, potentially a small vestibular schwannoma.  He also has evidence of remote right MCA territory infarcts.  O:  Vitals:   12/08/21 0407 12/08/21 0748  BP: 113/73 125/85  Pulse: 82 81  Resp: 19   Temp: 98.6 F (37 C) (!) 97.5 F (36.4 C)  SpO2: 97% 96%    Physical Exam Gen: A&Ox4, NAD HEENT: Atraumatic, normocephalic; oropharynx clear, tongue without atrophy or fasciculations. Resp: CTAB, normal work of breathing CV: RRR, extremities appear well-perfused. Abd: soft/NT/ND Extrem: Nml bulk; no cyanosis, clubbing, or edema.  Neuro: *MS: A&O x4. Follows multi-step commands.  *Speech: no dysarthria or aphasia, able to name and repeat. *CN:    I: Deferred   II,III: PERRLA, VFF by confrontation, optic discs not visualized 2/2 pupillary constriction   III,IV,VI: EOMI w/o nystagmus, no ptosis   V: Sensation intact from V1 to V3 to LT   VII: Eyelid closure was full.  Smile symmetric.   VIII: Hearing intact to voice   IX,X: Voice normal, palate elevates symmetrically    XI: SCM/trap 5/5 bilat   XII: Tongue protrudes midline, no atrophy or fasciculations  *Motor:   Normal bulk.  No tremor, rigidity or bradykinesia. No pronator drift.   Strength: Dlt Bic Tri WE WrF FgS Gr HF KnF KnE PlF DoF    Left _0 Right _1 *Sensory: Intact to light touch, pinprick, temperature vibration throughout. Symmetric. Propioception intact bilat.  No double-simultaneous extinction.  *Coordination:  Finger-to-nose, heel-to-shin, rapid alternating motions were intact. 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   Clinical presentation is not classic for GCA but certainly still on the differential. Symptoms concerning for GCA include reported fatigue, 20+lbs weight loss, fever, transient/brief vision loss and blurry vision and bitemporal and frontal headahce. Still his ESR and CRP are normal, no jaw claudication, no muscle aches concerning for PMR, no scalp tenderness. CTV neg for venous sinus thrombosis. D/w Dr. Katy Fitch of ophtho today who reviewed the MRI and feels it is most c/w optic perineuritis. This is a rare phenomenon; most cases are idiopathic but it can also be associated with multiple disorders including: Sarcoidosis, IgG4 related disease, granulomatosis with  polyangiitis, GCA, Behcet's disease, lupus, inflammatory bowel disease, syphilis, tuberculosis, HSP, herpes zoster, other viral encephalitides, leukemia, and primary or metastatic malignancy.  Temporal artery [place] prelim neg for halo sign. He is scheduled for temporal artery biopsy today at 11am.  I have ordered the additional serum work-up below based on MRI findings and he will also  require lumbar puncture.  Patient requested IR guided LP without bedside attempt which I will order to hopefully be performed this afternoon.  - Continue solumedrol 561m q 12 hrs for at least 3 days (divided into 2 daily doses per patient request), start date 9/12. IVMP will be followed by 639mprednisone daily pending further workup for possible GCA - Protonix 4062mV daily while on high dose steroids - [facility] send out Immunoglobulin G, Subclass 4, Serum TEST ID [id number] send out bartonella - ANA with reflex - ANCA - Quantiferon gold - Above labs all ordered - Temporal artery biopsy today at 11am - IR guided LP this afternoon after temporal artery biopsy. Will need the following CSF labs IN ORDER OF IMPORTANCE: cell count w/ diff x2 tubes, glucose, protein, gram stain, culture, biofire meningitis/encephalitis panel, flow cytometry, cytology, VDRL, acid-fast bacilli smear and culture - CXR WNL. Will hold off on serum ACE for now given poor sensitivity and specificity. - Continue to hold eliquis while awaiting LP - Patient is NPO  Will continue to follow.  ColSu MonksD [facility] [phone] 7pm- 7am, please page neurology on call as listed in [facility]

## 2021-12-08 NOTE — Transfer of Care (Signed)
Immediate Anesthesia Transfer of Care Note  Patient: Patrick Salinas  Procedure(s) Performed: LEFT BIOPSY TEMPORAL ARTERY (Left: Head)  Patient Location: PACU  Anesthesia Type:General  Level of Consciousness: awake and drowsy  Airway & Oxygen Therapy: Patient Spontanous Breathing  Post-op Assessment: Report given to RN and Post -op Vital signs reviewed and stable  Post vital signs: Reviewed and stable  Last Vitals:  Vitals Value Taken Time  BP 101/71 12/08/21 1021  Temp    Pulse 91 12/08/21 1025  Resp 14 12/08/21 1025  SpO2 88 % 12/08/21 1025  Vitals shown include unvalidated device data.  Last Pain:  Vitals:   12/08/21 0845  TempSrc: Oral  PainSc:          Complications: No notable events documented.

## 2021-12-09 ENCOUNTER — Encounter (HOSPITAL_COMMUNITY): Payer: Self-pay | Admitting: Vascular Surgery

## 2021-12-09 DIAGNOSIS — H5462 Unqualified visual loss, left eye, normal vision right eye: Secondary | ICD-10-CM | POA: Diagnosis not present

## 2021-12-09 DIAGNOSIS — M316 Other giant cell arteritis: Principal | ICD-10-CM

## 2021-12-09 DIAGNOSIS — H469 Unspecified optic neuritis: Secondary | ICD-10-CM | POA: Diagnosis not present

## 2021-12-09 HISTORY — DX: Other giant cell arteritis: M31.6

## 2021-12-09 LAB — IGG 4: IgG, Subclass 4: 35 mg/dL (ref 2–96)

## 2021-12-09 LAB — GLUCOSE, CAPILLARY
Glucose-Capillary: 170 mg/dL — ABNORMAL HIGH (ref 70–99)
Glucose-Capillary: 204 mg/dL — ABNORMAL HIGH (ref 70–99)
Glucose-Capillary: 228 mg/dL — ABNORMAL HIGH (ref 70–99)

## 2021-12-09 LAB — ANCA TITERS
Atypical P-ANCA titer: <1:20 {titer}
C-ANCA: <1:20 {titer}
P-ANCA: <1:20 {titer}

## 2021-12-09 LAB — SURGICAL PATHOLOGY

## 2021-12-09 MED ORDER — PREDNISONE 10 MG PO TABS: ORAL_TABLET | ORAL | 0 refills | Status: AC

## 2021-12-09 MED ORDER — PANTOPRAZOLE SODIUM 40 MG PO TBEC: 40.0000 mg | DELAYED_RELEASE_TABLET | Freq: Every day | ORAL | Status: DC

## 2021-12-09 MED ORDER — PREDNISONE 20 MG PO TABS: 60.0000 mg | ORAL_TABLET | Freq: Every day | ORAL | Status: DC

## 2021-12-09 MED ORDER — PANTOPRAZOLE SODIUM 40 MG PO TBEC: 40.0000 mg | DELAYED_RELEASE_TABLET | Freq: Every day | ORAL | 2 refills | Status: DC

## 2021-12-09 NOTE — Anesthesia Postprocedure Evaluation (Signed)
Anesthesia Post Note  Patient: Patrick Salinas  Procedure(s) Performed: LEFT BIOPSY TEMPORAL ARTERY (Left: Head)     Patient location during evaluation: PACU Anesthesia Type: General Level of consciousness: sedated and patient cooperative Pain management: pain level controlled Vital Signs Assessment: post-procedure vital signs reviewed and stable Respiratory status: spontaneous breathing Cardiovascular status: stable Anesthetic complications: no   No notable events documented.  Last Vitals:  Vitals:   12/09/21 1149 12/09/21 1538  BP: (!) 120/92 135/89  Pulse: 86 90  Resp: 16 17  Temp: 36.7 C 36.6 C  SpO2: 95% 97%    Last Pain:  Vitals:   12/09/21 1600  TempSrc:   PainSc: 0-No pain                 Nolon Nations

## 2021-12-09 NOTE — Progress Notes (Signed)
Pt verbalizes understanding of all discharge instructions, and where to pick up prescriptions.  Has all belongings with him.

## 2021-12-09 NOTE — Care Management Important Message (Signed)
Important Message  Patient Details  Name: Patrick Salinas MRN: [id number] Date of Birth: November 06, 1950   Medicare Important Message Given:  Yes     Hannah Beat 12/09/2021, 2:22 PM

## 2021-12-09 NOTE — Discharge Summary (Addendum)
Physician Discharge Summary   Patient: Patrick Salinas MRN: [id number] DOB: 1950/09/26  Admit date:     12/06/2021  Discharge date: 12/09/21  Discharge Physician: Patrick Salinas   PCP: Patrick Pillar, MD   Recommendations at discharge:   Resolution of temporal arteritis.  Prednisone 60 mg daily for 2 weeks then 50 mg daily for 2 weeks, then taper as per neurology/ophthalmology.  Discharge Diagnoses: Principal Problem:   Temporal arteritis ([place]) Active Problems:   Visual loss, left eye   Atrial fibrillation (HCC)   Essential hypertension   Optic perineuritis Steroid-induced hyperglycemia  Resolved Problems:   * No resolved hospital problems. 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  Admitted for empiric IV steroids and further investigation.  Followed by neurology, ophthalmology (remotely) and vascular surgery.  ESR and CRP were negative.  However temporal artery biopsy was positive.  Received high-dose IV steroids, transition to oral steroids with close outpatient follow-up with neurology and ophthalmology.  Does not require vascular surgery follow-up.  *Biopsy-proven temporal arteritis with associated intermittent visual loss, predominantly left eye --ESR and CRP normal. 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  Can be idiopathic or from multiple discrete causes.  Seen by neurology and vascular surgery.  Followed closely by outpatient ophthalmology. -- Status post temporal artery biopsy 9/14 as well as lumbar puncture.  Studies pending.  Treated with high-dose Solu-Medrol now transition to oral prednisone.  Discussed in detail with Patrick Salinas.  60 mg prednisone daily for 2 weeks then 50 mg daily for 2 weeks then taper as per outpatient physician. --No further visual events since initiation of steroids.  Steroid-induced hyperglycemia. --Mild in nature on very high-dose steroids.  Patient  has a glucometer at home and will follow intermittently.  Suspect quick return to euglycemia on much lower dose of steroid.   Essential hypertension -- Remains stable.   Persistent atrial fibrillation (HCC) -- Remains stable.  Continue diltiazem for rate control -- Can resume apixaban as per Patrick Salinas and Patrick Salinas    Consultants:  Neurology Vascular surgery Ophthalmology (remote/informal)  Procedures performed:  LP Temporal artery biopsy (left)  Disposition: Home Diet recommendation:  Discharge Diet Orders (From admission, onward)     Start     Ordered   12/09/21 0000  Diet general        12/09/21 1653           Regular diet DISCHARGE MEDICATION: Allergies as of 12/09/2021       Reactions   Hctz [hydrochlorothiazide] Other (See Comments)   Myalgia/ raises blood sugar   Lipitor [atorvastatin Calcium] Other (See Comments)   Myalgia/ raises blood sugar   Pineapple Swelling   Tongue swelling        Medication List     TAKE these medications    apixaban 5 MG Tabs tablet Commonly known as: ELIQUIS Take 1 tablet (5 mg total) by mouth 2 (two) times daily.   diltiazem 120 MG 24 hr capsule Commonly known as: CARDIZEM CD Take 1 capsule (120 mg total) by mouth 2 (two) times daily.   loratadine 10 MG tablet Commonly known as: CLARITIN Take 10 mg by mouth daily.   NASACORT ALLERGY 24HR NA Place 1 spray into the nose daily.   oxymetazoline 0.05 % nasal spray Commonly known as: AFRIN Place 1 spray into both nostrils daily as needed for congestion.   pantoprazole 40 MG tablet Commonly known as: PROTONIX  Take 1 tablet (40 mg total) by mouth daily.   predniSONE 10 MG tablet Commonly known as: DELTASONE Take 6 tablets (60 mg total) by mouth daily with breakfast for 14 days, THEN 5 tablets (50 mg total) daily with breakfast for 14 days, THEN 4 tablets (40 mg total) daily with breakfast for 14 days. Start taking on: December 10, 2021   rosuvastatin 20 MG  tablet Commonly known as: CRESTOR TAKE 1 TABLET BY MOUTH DAILY AT 6 PM. What changed: when to take this        Follow-up Information     Patrick Pillar, MD Follow up.   Specialty: Family Medicine Contact information: [address] [place] [address] [phone]         Patrick Fillers, MD Follow up.   Specialty: Ophthalmology Contact information: [address] [place] [address] [phone]                Feels ok. No visual changes  Discharge Exam: Filed Weights   12/06/21 1744  Weight: 91.6 kg   Vitals reviewed.  Constitutional:      General: He is not in acute distress.    Appearance: He is not ill-appearing or toxic-appearing.  Cardiovascular:     Rate and Rhythm: Normal rate and regular rhythm.     Heart sounds: No murmur heard. Pulmonary:     Effort: Pulmonary effort is normal. No respiratory distress.     Breath sounds: No wheezing, rhonchi or rales.  Neurological:     Mental Status: He is alert.  Psychiatric:        Mood and Affect: Mood normal.        Behavior: Behavior normal.   Condition at discharge: good  The results of significant diagnostics from this hospitalization (including imaging, microbiology, ancillary and laboratory) are listed below for reference.   Imaging Studies: DG FL GUIDED LUMBAR PUNCTURE  Result Date: 12/08/2021 CLINICAL DATA:  Patient with concern for optic perineuritis. Request for large volume lumbar puncture under imaging guidance. EXAM: LUMBAR PUNCTURE UNDER FLUOROSCOPY PROCEDURE: An appropriate skin entry site was determined fluoroscopically. Operator donned sterile gloves and mask. Skin site was marked, then prepped with Betadine, draped in usual sterile fashion, and infiltrated locally with 1% lidocaine. Initial attempts at the L4-5 and L5-S1 levels were unsuccessful. Subsequently, a 20 gauge spinal needle advanced into the thecal sac at the L3-4 level on the left. Clear  colorless CSF spontaneously returned, with opening pressure of 20 cm water. 29 ml CSF were collected and divided among 4 sterile vials for the requested laboratory studies. The needle was then removed. The patient tolerated the procedure well and there were no complications. FLUOROSCOPY: Radiation Exposure Index (as provided by the fluoroscopic device): 23.40 mGy Kerma IMPRESSION: Technically successful lumbar puncture under fluoroscopy. This exam was performed by Candiss Norse, PA-C, and was supervised and interpreted by Richardean Sale, MD. Electronically Signed   By: Richardean Sale M.D.   On: 12/08/2021 16:01   DG CHEST PORT 1 VIEW  Result Date: 12/07/2021 CLINICAL DATA:  Intermittent loss of vision. EXAM: PORTABLE CHEST 1 VIEW COMPARISON:  Chest radiograph dated 04/06/2019. FINDINGS: No focal consolidation, pleural effusion or pneumothorax. Top-normal cardiac size. No acute osseous pathology. IMPRESSION: No active disease. Electronically Signed   By: Anner Crete M.D.   On: 12/07/2021 19:30   VAS [place] TEMPORAL ARTERY BILATERAL  Result Date: 12/07/2021  TEMPORAL ARTERY REPORT Patient Name:  Patrick Salinas  Date of Exam:   12/07/2021 Medical Rec #: [id number]       Accession #:    [id number] Date of Birth: [birth year]        Patient Gender: M Patient Age:   6 years Exam Location:  [facility] Procedure:      VAS [place] ARTERY BILATERAL Referring Phys: Alferd Patee [facility] --------------------------------------------------------------------------------  Indications: Blurred vision , fever and headache. High Risk Factors: Age > 50 yrs.  Comparison Study: No prior studies. Performing Technologist: Darlin Coco RDMS, RVT  Examination Guidelines: Patient in reclined position. 2D, color and spectral doppler sampling in the temporal artery along the hairline and temple in the longitudinal plane. 2D images along the hairline and temple in the transverse plane. Exam is bilateral.  Summary: Absence of a  "halo" sign in the bilateral temporal artery, although not definitive, makes a diagnosis of temporal arteritis unlikely.  *See table(s) above for measurements and observations.    Preliminary    MR Brain W and Wo Contrast  Addendum Date: 12/07/2021   ADDENDUM REPORT: 12/07/2021 09:58 ADDENDUM: In addition to idiopathic orbital inflammation, differential diagnosis include orbital lymphoma and sarcoidosis. Electronically Signed   By: Pedro Earls M.D.   On: 12/07/2021 09:58   Result Date: 12/07/2021 CLINICAL DATA:  Vision loss, monocular; diplopia, vision loss intermittent in left eye. EXAM: MRI HEAD AND ORBITS WITHOUT AND WITH CONTRAST TECHNIQUE: Multiplanar, multiecho pulse sequences of the brain and surrounding structures were obtained without and with intravenous contrast. Multiplanar, multiecho pulse sequences of the orbits and surrounding structures were obtained including fat saturation techniques, before and after intravenous contrast administration. CONTRAST:  52m GADAVIST GADOBUTROL 1 MMOL/ML IV SOLN COMPARISON:  Head CT December 06, 2021; MRI of the brain May 18, 2018. FINDINGS: MRI HEAD FINDINGS Brain: No acute infarction, hemorrhage, hydrocephalus or extra-axial collection. Areas of encephalomalacia and gliosis in the right temporal and parietal lobes related to prior right MCA territory infarct. Scattered foci of T2 hyperintensity within the white matter of the cerebral hemispheres, nonspecific, most likely related to chronic microangiopathy. Moderate parenchymal volume loss. A 3 mm focus of abnormal contrast enhancement is seen in the left internal auditory canal. Vascular: Normal flow voids. Skull and upper cervical spine: Normal marrow signal. Other: None. MRI ORBITS FINDINGS Orbits: Irregular, shaggy enhancement for of the intraconal fat bilaterally, particularly around the optic nerve sheaths and posterior to the globes. Prominent enhancement of the extraocular muscles  without increased volume or mass effect. The globes and lacrimal glands appear preserved. No enhancement of the optic nerves. Visualized sinuses: Clear. Soft tissues: Negative. IMPRESSION: 1. Ill defined, irregular enhancement of the intraconal fat around the optic nerves bilaterally with associated enhancement of the extraocular muscles which remain with normal volume. Findings may be seen the setting of idiopathic orbital inflammation. 2. A 3 mm focus of contrast enhancement within the left internal auditory canal fundus, may represent a small vestibular schwannoma. 3. Remote right MCA territory infarcts. These results were called by telephone at the time of interpretation on 12/06/2021 at 5:49 pm to provider RILEY RANSOM , who verbally acknowledged these results. Electronically Signed: By: KPedro EarlsM.D. On: 12/06/2021 17:51   MR ORBITS W WO CONTRAST  Addendum Date: 12/07/2021   ADDENDUM REPORT: 12/07/2021 09:58 ADDENDUM: In addition to idiopathic orbital inflammation, differential diagnosis include orbital lymphoma and sarcoidosis. Electronically Signed   By: KPedro EarlsM.D.   On: 12/07/2021 09:58   Result Date:  12/07/2021 CLINICAL DATA:  Vision loss, monocular; diplopia, vision loss intermittent in left eye. EXAM: MRI HEAD AND ORBITS WITHOUT AND WITH CONTRAST TECHNIQUE: Multiplanar, multiecho pulse sequences of the brain and surrounding structures were obtained without and with intravenous contrast. Multiplanar, multiecho pulse sequences of the orbits and surrounding structures were obtained including fat saturation techniques, before and after intravenous contrast administration. CONTRAST:  71m GADAVIST GADOBUTROL 1 MMOL/ML IV SOLN COMPARISON:  Head CT December 06, 2021; MRI of the brain May 18, 2018. FINDINGS: MRI HEAD FINDINGS Brain: No acute infarction, hemorrhage, hydrocephalus or extra-axial collection. Areas of encephalomalacia and gliosis in the right  temporal and parietal lobes related to prior right MCA territory infarct. Scattered foci of T2 hyperintensity within the white matter of the cerebral hemispheres, nonspecific, most likely related to chronic microangiopathy. Moderate parenchymal volume loss. A 3 mm focus of abnormal contrast enhancement is seen in the left internal auditory canal. Vascular: Normal flow voids. Skull and upper cervical spine: Normal marrow signal. Other: None. MRI ORBITS FINDINGS Orbits: Irregular, shaggy enhancement for of the intraconal fat bilaterally, particularly around the optic nerve sheaths and posterior to the globes. Prominent enhancement of the extraocular muscles without increased volume or mass effect. The globes and lacrimal glands appear preserved. No enhancement of the optic nerves. Visualized sinuses: Clear. Soft tissues: Negative. IMPRESSION: 1. Ill defined, irregular enhancement of the intraconal fat around the optic nerves bilaterally with associated enhancement of the extraocular muscles which remain with normal volume. Findings may be seen the setting of idiopathic orbital inflammation. 2. A 3 mm focus of contrast enhancement within the left internal auditory canal fundus, may represent a small vestibular schwannoma. 3. Remote right MCA territory infarcts. These results were called by telephone at the time of interpretation on 12/06/2021 at 5:49 pm to provider RILEY RANSOM , who verbally acknowledged these results. Electronically Signed: By: KPedro EarlsM.D. On: 12/06/2021 17:51   CT ANGIO HEAD NECK W WO CM  Result Date: 12/06/2021 CLINICAL DATA:  Diplopia, vision loss. EXAM: CT ANGIOGRAPHY HEAD AND NECK CT VENOGRAM HEAD TECHNIQUE: Multidetector CT imaging of the head and neck was performed using the standard protocol during bolus administration of intravenous contrast. Multiplanar CT image reconstructions and MIPs were obtained to evaluate the vascular anatomy. Carotid stenosis measurements  (when applicable) are obtained utilizing NASCET criteria, using the distal internal carotid diameter as the denominator. Venographic phase images of the brain were obtained following the administration of intravenous contrast. Multiplanar reformats and maximum intensity projections were generated. RADIATION DOSE REDUCTION: This exam was performed according to the departmental dose-optimization program which includes automated exposure control, adjustment of the mA and/or kV according to patient size and/or use of iterative reconstruction technique. CONTRAST:  742mOMNIPAQUE IOHEXOL 350 MG/ML SOLN COMPARISON:  MR head 05/18/2018, CTA head and neck 05/17/2018 FINDINGS: CT HEAD FINDINGS Brain: There is no acute intracranial hemorrhage, extra-axial fluid collection, or acute infarct. Background parenchymal volume is normal. The ventricles are normal in size. There is encephalomalacia in the right parietal and temporal lobes related to prior MCA distribution infarcts seen on prior brain MRI. There is no other encephalomalacia. There is no mass lesion.  There is no mass effect or midline shift. Vascular: See below. Skull: Normal. Negative for fracture or focal lesion. Sinuses/Orbits: The paranasal sinuses are clear. The globes and orbits are unremarkable. Other: None. Review of the MIP images confirms the above findings CTA NECK FINDINGS Aortic arch: The imaged aortic arch is normal. The  origins of the major branch vessels are patent. The subclavian arteries are patent to the level imaged. Right carotid system: The right common, internal, and external carotid arteries are patent, without hemodynamically significant stenosis or occlusion. There is no dissection or aneurysm. Left carotid system: The left common, internal, and external carotid arteries are patent, without hemodynamically significant stenosis or occlusion. There is minimal plaque at the bifurcation there is no dissection or aneurysm. Vertebral arteries: The  vertebral arteries are patent, without hemodynamically significant stenosis or occlusion. There is no dissection or aneurysm. Skeleton: There is osseous fusion of the C3 through C5 vertebral bodies. There is no acute osseous abnormality or suspicious osseous lesion. There is no visible canal hematoma. Other neck: The soft tissues of the neck are unremarkable. Upper chest: The imaged lung apices are clear. An accessory azygous fissure is noted. Review of the MIP images confirms the above findings CTA HEAD FINDINGS Anterior circulation: The intracranial ICAs are patent. The right M1 segment and branch vessels are patent. The left M1 segment and branch vessels are patent. The bilateral ACAs are patent. The anterior communicating artery is normal. There is no aneurysm or AVM. Posterior circulation: There is mild irregularity of the V4 segments likely reflecting atherosclerotic disease without hemodynamically significant stenosis or occlusion. The basilar artery is patent. The major cerebellar arteries are identified The bilateral PCAs are patent. Bilateral posterior communicating arteries are identified with a small left P1 segment and no right P1 segment (primarily fetal origins bilaterally). There is no aneurysm or AVM. Venous sinuses: Assessed below on the CT venogram portion of the exam. Anatomic variants: As above. Review of the MIP images confirms the above findings CTV HEAD FINDINGS The superior sagittal sinus, internal cerebral veins, vein of Galen, straight sinus, transverse sinuses, sigmoid sinuses, and jugular bulbs are patent without evidence of thrombus or significant stenosis. IMPRESSION: 1. No acute intracranial pathology. 2. Remote infarcts in the right MCA distribution. 3. Patent vasculature of the head and neck with no hemodynamically significant stenosis or occlusion. 4. No evidence of venous thrombosis. Electronically Signed   By: Valetta Mole M.D.   On: 12/06/2021 13:58   CT VENOGRAM  HEAD  Result Date: 12/06/2021 CLINICAL DATA:  Diplopia, vision loss. EXAM: CT ANGIOGRAPHY HEAD AND NECK CT VENOGRAM HEAD TECHNIQUE: Multidetector CT imaging of the head and neck was performed using the standard protocol during bolus administration of intravenous contrast. Multiplanar CT image reconstructions and MIPs were obtained to evaluate the vascular anatomy. Carotid stenosis measurements (when applicable) are obtained utilizing NASCET criteria, using the distal internal carotid diameter as the denominator. Venographic phase images of the brain were obtained following the administration of intravenous contrast. Multiplanar reformats and maximum intensity projections were generated. RADIATION DOSE REDUCTION: This exam was performed according to the departmental dose-optimization program which includes automated exposure control, adjustment of the mA and/or kV according to patient size and/or use of iterative reconstruction technique. CONTRAST:  24m OMNIPAQUE IOHEXOL 350 MG/ML SOLN COMPARISON:  MR head 05/18/2018, CTA head and neck 05/17/2018 FINDINGS: CT HEAD FINDINGS Brain: There is no acute intracranial hemorrhage, extra-axial fluid collection, or acute infarct. Background parenchymal volume is normal. The ventricles are normal in size. There is encephalomalacia in the right parietal and temporal lobes related to prior MCA distribution infarcts seen on prior brain MRI. There is no other encephalomalacia. There is no mass lesion.  There is no mass effect or midline shift. Vascular: See below. Skull: Normal. Negative for fracture or focal lesion. Sinuses/Orbits:  The paranasal sinuses are clear. The globes and orbits are unremarkable. Other: None. Review of the MIP images confirms the above findings CTA NECK FINDINGS Aortic arch: The imaged aortic arch is normal. The origins of the major branch vessels are patent. The subclavian arteries are patent to the level imaged. Right carotid system: The right common,  internal, and external carotid arteries are patent, without hemodynamically significant stenosis or occlusion. There is no dissection or aneurysm. Left carotid system: The left common, internal, and external carotid arteries are patent, without hemodynamically significant stenosis or occlusion. There is minimal plaque at the bifurcation there is no dissection or aneurysm. Vertebral arteries: The vertebral arteries are patent, without hemodynamically significant stenosis or occlusion. There is no dissection or aneurysm. Skeleton: There is osseous fusion of the C3 through C5 vertebral bodies. There is no acute osseous abnormality or suspicious osseous lesion. There is no visible canal hematoma. Other neck: The soft tissues of the neck are unremarkable. Upper chest: The imaged lung apices are clear. An accessory azygous fissure is noted. Review of the MIP images confirms the above findings CTA HEAD FINDINGS Anterior circulation: The intracranial ICAs are patent. The right M1 segment and branch vessels are patent. The left M1 segment and branch vessels are patent. The bilateral ACAs are patent. The anterior communicating artery is normal. There is no aneurysm or AVM. Posterior circulation: There is mild irregularity of the V4 segments likely reflecting atherosclerotic disease without hemodynamically significant stenosis or occlusion. The basilar artery is patent. The major cerebellar arteries are identified The bilateral PCAs are patent. Bilateral posterior communicating arteries are identified with a small left P1 segment and no right P1 segment (primarily fetal origins bilaterally). There is no aneurysm or AVM. Venous sinuses: Assessed below on the CT venogram portion of the exam. Anatomic variants: As above. Review of the MIP images confirms the above findings CTV HEAD FINDINGS The superior sagittal sinus, internal cerebral veins, vein of Galen, straight sinus, transverse sinuses, sigmoid sinuses, and jugular bulbs  are patent without evidence of thrombus or significant stenosis. IMPRESSION: 1. No acute intracranial pathology. 2. Remote infarcts in the right MCA distribution. 3. Patent vasculature of the head and neck with no hemodynamically significant stenosis or occlusion. 4. No evidence of venous thrombosis. Electronically Signed   By: Valetta Mole M.D.   On: 12/06/2021 13:58    Microbiology: Results for orders placed or performed during the hospital encounter of 12/06/21  CSF culture w Gram Stain     Status: None (Preliminary result)   Collection Time: 12/08/21  2:53 PM   Specimen: PATH Cytology CSF; Cerebrospinal Fluid  Result Value Ref Range Status   Specimen Description CSF  Final   Special Requests NONE  Final   Gram Stain   Final    WBC PRESENT, PREDOMINANTLY PMN NO ORGANISMS SEEN CYTOSPIN SMEAR    Culture   Final    NO GROWTH < 24 HOURS Performed at [facility], [place] [address] [place], [place] [address]    Report Status PENDING  Incomplete    Labs: CBC: Recent Labs  Lab 12/06/21 1201 12/07/21 0331  WBC 9.3 6.1  NEUTROABS 6.5  --   HGB 17.4* 17.1*  HCT 52.1* 49.0  MCV 92.2 89.1  PLT 277 591   Basic Metabolic Panel: Recent Labs  Lab 12/06/21 1201 12/07/21 0331  NA 140 137  K 4.6 3.8  CL 105 105  CO2 25 22  GLUCOSE 98 165*  BUN 16 15  CREATININE 1.02 0.96  CALCIUM 9.8 9.1   Liver Function Tests: Recent Labs  Lab 12/06/21 1201  AST 22  ALT 25  ALKPHOS 94  BILITOT 1.3*  PROT 7.7  ALBUMIN 4.2   CBG: Recent Labs  Lab 12/08/21 1633 12/08/21 2155 12/09/21 0607 12/09/21 1147 12/09/21 1539  GLUCAP 151* 263* 170* 204* 228*    Discharge time spent: greater than 30 minutes.  Signed: Murray Hodgkins, MD [facility] 12/09/2021

## 2021-12-09 NOTE — Progress Notes (Signed)
  Progress Note    12/09/2021 10:04 AM 1 Day Post-Op  Subjective:  no overnight issues, has complaints about his treatment here at [facility]  Vitals:   12/09/21 0341 12/09/21 0754  BP: 121/86 (!) 132/90  Pulse: 84 90  Resp: 20 16  Temp: 97.6 F (36.4 C) 98 F (36.7 C)  SpO2: 96% 97%    Physical Exam: Awake alert oriented Left temporal incision healing well  CBC    Component Value Date/Time   WBC 6.1 12/07/2021 0331   RBC 5.50 12/07/2021 0331   HGB 17.1 (H) 12/07/2021 0331   HCT 49.0 12/07/2021 0331   PLT 259 12/07/2021 0331   MCV 89.1 12/07/2021 0331   MCH 31.1 12/07/2021 0331   MCHC 34.9 12/07/2021 0331   RDW 12.4 12/07/2021 0331   LYMPHSABS 1.8 12/06/2021 1201   MONOABS 0.9 12/06/2021 1201   EOSABS 0.1 12/06/2021 1201   BASOSABS 0.1 12/06/2021 1201    BMET    Component Value Date/Time   NA 137 12/07/2021 0331   K 3.8 12/07/2021 0331   CL 105 12/07/2021 0331   CO2 22 12/07/2021 0331   GLUCOSE 165 (H) 12/07/2021 0331   BUN 15 12/07/2021 0331   CREATININE 0.96 12/07/2021 0331   CALCIUM 9.1 12/07/2021 0331   GFRNONAA >60 12/07/2021 0331   GFRAA >60 04/06/2019 1612    INR    Component Value Date/Time   INR 1.06 05/17/2018 1340     Intake/Output Summary (Last 24 hours) at 12/09/2021 1004 Last data filed at 12/09/2021 0700 Gross per 24 hour  Intake 820 ml  Output 1250 ml  Net -430 ml     Assessment/plan:  71 y.o. male status post left temporal artery biopsy for concern of temporal arteritis.  We will follow-up with primary team when path returns.  Patient does not need specific vascular surgery follow-up.     Oaklee Sunga C. Donzetta Matters, MD [facility] Office: [phone] Pager: [phone]  12/09/2021 10:04 AM

## 2021-12-11 ENCOUNTER — Other Ambulatory Visit: Payer: Self-pay | Admitting: Neurology

## 2021-12-11 DIAGNOSIS — M316 Other giant cell arteritis: Secondary | ICD-10-CM

## 2021-12-11 NOTE — Progress Notes (Signed)
Amb ref neuro placed

## 2021-12-12 DIAGNOSIS — H43813 Vitreous degeneration, bilateral: Secondary | ICD-10-CM | POA: Diagnosis not present

## 2021-12-12 DIAGNOSIS — H532 Diplopia: Secondary | ICD-10-CM | POA: Diagnosis not present

## 2021-12-12 DIAGNOSIS — M316 Other giant cell arteritis: Secondary | ICD-10-CM | POA: Diagnosis not present

## 2021-12-12 DIAGNOSIS — H468 Other optic neuritis: Secondary | ICD-10-CM | POA: Diagnosis not present

## 2021-12-12 DIAGNOSIS — H53122 Transient visual loss, left eye: Secondary | ICD-10-CM | POA: Diagnosis not present

## 2021-12-12 DIAGNOSIS — H2513 Age-related nuclear cataract, bilateral: Secondary | ICD-10-CM | POA: Diagnosis not present

## 2021-12-12 DIAGNOSIS — R519 Headache, unspecified: Secondary | ICD-10-CM | POA: Diagnosis not present

## 2021-12-12 LAB — CSF CULTURE W GRAM STAIN: Culture: NO GROWTH

## 2021-12-12 LAB — VDRL, CSF: VDRL Quant, CSF: NONREACTIVE

## 2021-12-12 LAB — QUANTIFERON-TB GOLD PLUS (RQFGPL)
QuantiFERON Mitogen Value: 0.01 IU/mL
QuantiFERON Nil Value: 0 IU/mL
QuantiFERON TB1 Ag Value: 0 IU/mL
QuantiFERON TB2 Ag Value: 0 IU/mL

## 2021-12-12 LAB — QUANTIFERON-TB GOLD PLUS: QuantiFERON-TB Gold Plus: UNDETERMINED — AB

## 2021-12-13 LAB — ACID FAST SMEAR (AFB, MYCOBACTERIA): Acid Fast Smear: NEGATIVE

## 2021-12-13 LAB — OLIGOCLONAL BANDS, CSF + SERM

## 2021-12-14 DIAGNOSIS — I4891 Unspecified atrial fibrillation: Secondary | ICD-10-CM | POA: Diagnosis not present

## 2021-12-14 DIAGNOSIS — M316 Other giant cell arteritis: Secondary | ICD-10-CM | POA: Diagnosis not present

## 2021-12-14 DIAGNOSIS — R7303 Prediabetes: Secondary | ICD-10-CM | POA: Diagnosis not present

## 2021-12-14 LAB — CYTOLOGY - NON PAP

## 2021-12-15 DIAGNOSIS — M316 Other giant cell arteritis: Secondary | ICD-10-CM | POA: Diagnosis not present

## 2021-12-15 DIAGNOSIS — Z6824 Body mass index (BMI) 24.0-24.9, adult: Secondary | ICD-10-CM | POA: Diagnosis not present

## 2021-12-15 DIAGNOSIS — Z7952 Long term (current) use of systemic steroids: Secondary | ICD-10-CM | POA: Diagnosis not present

## 2021-12-22 DIAGNOSIS — M316 Other giant cell arteritis: Secondary | ICD-10-CM | POA: Diagnosis not present

## 2021-12-27 DIAGNOSIS — R519 Headache, unspecified: Secondary | ICD-10-CM | POA: Diagnosis not present

## 2021-12-27 DIAGNOSIS — H401411 Capsular glaucoma with pseudoexfoliation of lens, right eye, mild stage: Secondary | ICD-10-CM | POA: Diagnosis not present

## 2021-12-27 DIAGNOSIS — H43813 Vitreous degeneration, bilateral: Secondary | ICD-10-CM | POA: Diagnosis not present

## 2021-12-27 DIAGNOSIS — H532 Diplopia: Secondary | ICD-10-CM | POA: Diagnosis not present

## 2021-12-27 DIAGNOSIS — H53122 Transient visual loss, left eye: Secondary | ICD-10-CM | POA: Diagnosis not present

## 2021-12-27 DIAGNOSIS — H468 Other optic neuritis: Secondary | ICD-10-CM | POA: Diagnosis not present

## 2021-12-27 DIAGNOSIS — M316 Other giant cell arteritis: Secondary | ICD-10-CM | POA: Diagnosis not present

## 2021-12-27 DIAGNOSIS — H2513 Age-related nuclear cataract, bilateral: Secondary | ICD-10-CM | POA: Diagnosis not present

## 2022-01-04 ENCOUNTER — Ambulatory Visit: Payer: No Typology Code available for payment source | Admitting: Neurology

## 2022-01-11 DIAGNOSIS — M316 Other giant cell arteritis: Secondary | ICD-10-CM | POA: Diagnosis not present

## 2022-01-11 DIAGNOSIS — R5383 Other fatigue: Secondary | ICD-10-CM | POA: Diagnosis not present

## 2022-01-11 DIAGNOSIS — I4891 Unspecified atrial fibrillation: Secondary | ICD-10-CM | POA: Diagnosis not present

## 2022-01-19 DIAGNOSIS — R5383 Other fatigue: Secondary | ICD-10-CM | POA: Diagnosis not present

## 2022-01-19 DIAGNOSIS — M316 Other giant cell arteritis: Secondary | ICD-10-CM | POA: Diagnosis not present

## 2022-01-19 DIAGNOSIS — Z79899 Other long term (current) drug therapy: Secondary | ICD-10-CM | POA: Diagnosis not present

## 2022-01-24 LAB — ACID FAST CULTURE WITH REFLEXED SENSITIVITIES (MYCOBACTERIA): Acid Fast Culture: NEGATIVE

## 2022-01-31 DIAGNOSIS — Z79899 Other long term (current) drug therapy: Secondary | ICD-10-CM | POA: Diagnosis not present

## 2022-01-31 DIAGNOSIS — Z7952 Long term (current) use of systemic steroids: Secondary | ICD-10-CM | POA: Diagnosis not present

## 2022-01-31 DIAGNOSIS — Z6826 Body mass index (BMI) 26.0-26.9, adult: Secondary | ICD-10-CM | POA: Diagnosis not present

## 2022-01-31 DIAGNOSIS — E663 Overweight: Secondary | ICD-10-CM | POA: Diagnosis not present

## 2022-01-31 DIAGNOSIS — M316 Other giant cell arteritis: Secondary | ICD-10-CM | POA: Diagnosis not present

## 2022-02-03 DIAGNOSIS — R519 Headache, unspecified: Secondary | ICD-10-CM | POA: Diagnosis not present

## 2022-02-03 DIAGNOSIS — H468 Other optic neuritis: Secondary | ICD-10-CM | POA: Diagnosis not present

## 2022-02-03 DIAGNOSIS — M316 Other giant cell arteritis: Secondary | ICD-10-CM | POA: Diagnosis not present

## 2022-02-03 DIAGNOSIS — H401411 Capsular glaucoma with pseudoexfoliation of lens, right eye, mild stage: Secondary | ICD-10-CM | POA: Diagnosis not present

## 2022-02-03 DIAGNOSIS — H53122 Transient visual loss, left eye: Secondary | ICD-10-CM | POA: Diagnosis not present

## 2022-02-03 DIAGNOSIS — H3581 Retinal edema: Secondary | ICD-10-CM | POA: Diagnosis not present

## 2022-02-03 DIAGNOSIS — H532 Diplopia: Secondary | ICD-10-CM | POA: Diagnosis not present

## 2022-02-03 DIAGNOSIS — H43813 Vitreous degeneration, bilateral: Secondary | ICD-10-CM | POA: Diagnosis not present

## 2022-02-03 DIAGNOSIS — H2513 Age-related nuclear cataract, bilateral: Secondary | ICD-10-CM | POA: Diagnosis not present

## 2022-02-10 DIAGNOSIS — R519 Headache, unspecified: Secondary | ICD-10-CM | POA: Diagnosis not present

## 2022-02-10 DIAGNOSIS — H532 Diplopia: Secondary | ICD-10-CM | POA: Diagnosis not present

## 2022-02-10 DIAGNOSIS — H401410 Capsular glaucoma with pseudoexfoliation of lens, right eye, stage unspecified: Secondary | ICD-10-CM | POA: Diagnosis not present

## 2022-02-10 DIAGNOSIS — H468 Other optic neuritis: Secondary | ICD-10-CM | POA: Diagnosis not present

## 2022-02-10 DIAGNOSIS — M316 Other giant cell arteritis: Secondary | ICD-10-CM | POA: Diagnosis not present

## 2022-02-10 DIAGNOSIS — H3581 Retinal edema: Secondary | ICD-10-CM | POA: Diagnosis not present

## 2022-02-10 DIAGNOSIS — H53122 Transient visual loss, left eye: Secondary | ICD-10-CM | POA: Diagnosis not present

## 2022-02-10 DIAGNOSIS — H2513 Age-related nuclear cataract, bilateral: Secondary | ICD-10-CM | POA: Diagnosis not present

## 2022-02-10 DIAGNOSIS — H43813 Vitreous degeneration, bilateral: Secondary | ICD-10-CM | POA: Diagnosis not present

## 2022-02-21 DIAGNOSIS — M316 Other giant cell arteritis: Secondary | ICD-10-CM | POA: Diagnosis not present

## 2022-02-28 DIAGNOSIS — H3581 Retinal edema: Secondary | ICD-10-CM | POA: Diagnosis not present

## 2022-02-28 DIAGNOSIS — H53431 Sector or arcuate defects, right eye: Secondary | ICD-10-CM | POA: Diagnosis not present

## 2022-03-06 DIAGNOSIS — H401410 Capsular glaucoma with pseudoexfoliation of lens, right eye, stage unspecified: Secondary | ICD-10-CM | POA: Diagnosis not present

## 2022-03-06 DIAGNOSIS — H468 Other optic neuritis: Secondary | ICD-10-CM | POA: Diagnosis not present

## 2022-03-06 DIAGNOSIS — H532 Diplopia: Secondary | ICD-10-CM | POA: Diagnosis not present

## 2022-03-06 DIAGNOSIS — H2513 Age-related nuclear cataract, bilateral: Secondary | ICD-10-CM | POA: Diagnosis not present

## 2022-03-06 DIAGNOSIS — H3581 Retinal edema: Secondary | ICD-10-CM | POA: Diagnosis not present

## 2022-03-06 DIAGNOSIS — H43813 Vitreous degeneration, bilateral: Secondary | ICD-10-CM | POA: Diagnosis not present

## 2022-03-06 DIAGNOSIS — H53122 Transient visual loss, left eye: Secondary | ICD-10-CM | POA: Diagnosis not present

## 2022-03-06 DIAGNOSIS — R519 Headache, unspecified: Secondary | ICD-10-CM | POA: Diagnosis not present

## 2022-03-06 DIAGNOSIS — M316 Other giant cell arteritis: Secondary | ICD-10-CM | POA: Diagnosis not present

## 2022-03-23 DIAGNOSIS — M316 Other giant cell arteritis: Secondary | ICD-10-CM | POA: Diagnosis not present

## 2022-03-23 DIAGNOSIS — Z79899 Other long term (current) drug therapy: Secondary | ICD-10-CM | POA: Diagnosis not present

## 2022-03-23 DIAGNOSIS — R5383 Other fatigue: Secondary | ICD-10-CM | POA: Diagnosis not present

## 2022-04-04 DIAGNOSIS — L98491 Non-pressure chronic ulcer of skin of other sites limited to breakdown of skin: Secondary | ICD-10-CM | POA: Diagnosis not present

## 2022-04-04 DIAGNOSIS — Z79899 Other long term (current) drug therapy: Secondary | ICD-10-CM | POA: Diagnosis not present

## 2022-04-04 DIAGNOSIS — M316 Other giant cell arteritis: Secondary | ICD-10-CM | POA: Diagnosis not present

## 2022-04-04 DIAGNOSIS — E663 Overweight: Secondary | ICD-10-CM | POA: Diagnosis not present

## 2022-04-04 DIAGNOSIS — Z6826 Body mass index (BMI) 26.0-26.9, adult: Secondary | ICD-10-CM | POA: Diagnosis not present

## 2022-04-04 DIAGNOSIS — Z7952 Long term (current) use of systemic steroids: Secondary | ICD-10-CM | POA: Diagnosis not present

## 2022-04-05 ENCOUNTER — Other Ambulatory Visit: Payer: Self-pay

## 2022-04-05 ENCOUNTER — Encounter: Payer: Self-pay | Admitting: Cardiology

## 2022-04-05 ENCOUNTER — Ambulatory Visit (HOSPITAL_COMMUNITY)
Admission: RE | Admit: 2022-04-05 | Discharge: 2022-04-05 | Disposition: A | Discharge status: Home / Self Care | Payer: Medicare Other | Source: Ambulatory Visit | Attending: Vascular Surgery | Admitting: Vascular Surgery

## 2022-04-05 ENCOUNTER — Encounter: Payer: Self-pay | Admitting: Vascular Surgery

## 2022-04-05 ENCOUNTER — Other Ambulatory Visit: Payer: Self-pay | Admitting: *Deleted

## 2022-04-05 ENCOUNTER — Ambulatory Visit (INDEPENDENT_AMBULATORY_CARE_PROVIDER_SITE_OTHER): Payer: Medicare Other | Admitting: Vascular Surgery

## 2022-04-05 VITALS — BP 131/83 | HR 89 | Temp 97.9°F | Resp 20 | Ht 75.0 in | Wt 202.0 lb

## 2022-04-05 DIAGNOSIS — I96 Gangrene, not elsewhere classified: Secondary | ICD-10-CM | POA: Diagnosis not present

## 2022-04-05 DIAGNOSIS — M316 Other giant cell arteritis: Secondary | ICD-10-CM

## 2022-04-05 NOTE — Progress Notes (Signed)
Patient ID: Patrick Salinas, male   DOB: 1950-03-30, 72 y.o.   MRN: [id number]  Reason for Consult: Follow-up   Referred by Hennie Duos, MD  Subjective:     HPI:  Patrick Salinas is a 72 y.o. male with history of temporal arteritis currently on prednisone taper and has had some recurrent visual symptoms when he tapered down to 30 mg of prednisone.  More recently he was diagnosed with COVID he has recovered from this in the past week.  States for a few weeks he has had pain in his left index finger and has worsening discoloration with black discoloration and he is insensate on the distal tip and he has severe pain throughout most of the day unless he holds his finger in a dependent position or keeps it warm with heating pads.  He does follow with rheumatology for his diagnosis of arteritis.  He also has a cardiologist which he has seen in the past at the time of embolic stroke from atrial fibrillation and he is also maintained on Eliquis which he has been religious in taking.  He does not have any toe issues does not have any pain or problems in any of his other digits.    Past Medical History:  Diagnosis Date   Cancer [facility])    Prostate cancer -dx 5'16   Hypercholesterolemia    no meds due to adverse reaction   Temporal arteritis ([place]) 12/09/2021   History reviewed. No pertinent family history. Past Surgical History:  Procedure Laterality Date   ARTERY BIOPSY Left 12/08/2021   Procedure: LEFT BIOPSY TEMPORAL ARTERY;  Surgeon: Waynetta Sandy, MD;  Location: [place];  Service: Vascular;  Laterality: Left;   BIOPSY PROSTATE     5'16   CERVICAL FUSION     COLONOSCOPY W/ POLYPECTOMY     x2 benign   IR CT HEAD LTD  05/17/2018   IR PERCUTANEOUS ART THROMBECTOMY/INFUSION INTRACRANIAL INC DIAG ANGIO  05/17/2018   IR US GUIDE VASC ACCESS RIGHT  05/17/2018   RADIOLOGY WITH ANESTHESIA N/A 05/17/2018   Procedure: IR WITH ANESTHESIA;  Surgeon: Corrie Mckusick, DO;  Location: [place];  Service:  Anesthesiology;  Laterality: N/A;   ROBOT ASSISTED LAPAROSCOPIC RADICAL PROSTATECTOMY N/A 11/02/2014   Procedure: ROBOTIC ASSISTED LAPAROSCOPIC RADICAL PROSTATECTOMY LEVEL 1;  Surgeon: Raynelle Bring, MD;  Location: [facility];  Service: Urology;  Laterality: N/A;   TONSILLECTOMY      Short Social History:  Social History   Tobacco Use   Smoking status: Never   Smokeless tobacco: Never  Substance Use Topics   Alcohol use: Yes    Comment: one drink daily    Allergies  Allergen Reactions   Hctz [Hydrochlorothiazide] Other (See Comments)    Myalgia/ raises blood sugar   Lipitor [Atorvastatin Calcium] Other (See Comments)    Myalgia/ raises blood sugar   Pineapple Swelling    Tongue swelling    Current Outpatient Medications  Medication Sig Dispense Refill   apixaban (ELIQUIS) 5 MG TABS tablet Take 1 tablet (5 mg total) by mouth 2 (two) times daily. 60 tablet 2   diltiazem (CARDIZEM CD) 120 MG 24 hr capsule Take 1 capsule (120 mg total) by mouth 2 (two) times daily. 180 capsule 2   pantoprazole (PROTONIX) 40 MG tablet Take 1 tablet (40 mg total) by mouth daily. 30 tablet 2   predniSONE (DELTASONE) 5 MG tablet Take 20 mg by mouth daily with breakfast.     rosuvastatin (  CRESTOR) 20 MG tablet TAKE 1 TABLET BY MOUTH DAILY AT 6 PM. (Patient taking differently: Take 20 mg by mouth daily at 6 PM.) 30 tablet 0   No current facility-administered medications for this visit.    Review of Systems  Constitutional:  Constitutional negative. HENT: HENT negative.  Eyes: Eyes negative.  Respiratory: Respiratory negative.  Cardiovascular: Cardiovascular negative.  GI: Gastrointestinal negative.  Musculoskeletal:       Left index finger pain Skin: Positive for wound.  Neurological: Neurological negative. Hematologic: Hematologic/lymphatic negative.  Psychiatric: Psychiatric negative.        Objective:  Objective  Vitals:   04/05/22 1336  BP: 131/83  Pulse: 89  Resp: 20  Temp: 97.9 F  (36.6 C)  SpO2: 93%     Physical Exam HENT:     Head: Normocephalic.     Nose: Nose normal.  Eyes:     Pupils: Pupils are equal, round, and reactive to light.  Cardiovascular:     Rate and Rhythm: Normal rate.     Pulses:          Radial pulses are 2+ on the right side and 2+ on the left side.     Comments: He has a monophasic left palmar arch signal Pulmonary:     Effort: Pulmonary effort is normal.  Abdominal:     General: Abdomen is flat.  Musculoskeletal:        General: Normal range of motion.     Cervical back: Normal range of motion and neck supple.     Comments: Ischemic necrosis of left index finger in the distal fingertip  Skin:    General: Skin is warm.     Capillary Refill: Capillary refill takes less than 2 seconds.  Neurological:     General: No focal deficit present.     Mental Status: He is alert.  Psychiatric:        Mood and Affect: Mood normal.        Thought Content: Thought content normal.     Data: Right Doppler Findings:  +--------+--------+-----+---------+--------+  Site   PressureIndexDoppler  Comments  +--------+--------+-----+---------+--------+  [id number]         triphasic          +--------+--------+-----+---------+--------+      Left Doppler Findings:  +--------+--------+-----+---------+--------+  Site   PressureIndexDoppler  Comments  +--------+--------+-----+---------+--------+  [id number]         triphasic          +--------+--------+-----+---------+--------+  Radial              triphasic          +--------+--------+-----+---------+--------+  Ulnar               triphasic          +--------+--------+-----+---------+--------+     Technologist Notes:   Left: PPG tracings from the 2nd, 3rd, 4th and 5th finger were essentially  non pulsatile.  Left brachial, radial and ulnar arteries are triphasic.      Assessment/Plan:    72 year old male with ischemic ulceration of the left index  finger distal tip with palpable radial pulses and preserved ulnar artery signal in the left wrist as well and a monophasic palmar arch and the thumb is unaffected.  Etiology not completely clear possibly related to underlying arteritis but this has been treated with rheumatology and he continues on steroids.  He also may have an embolic issue either from atrial fibrillation or from a proximal lesion although no  previous lesion was noted on CT of the neck which captured part of the chest back in September.  He is also on Eliquis which should be protective.  Either way I am going to refer him to hand surgery given that he has sufficient flow at least to the hand for sufficient healing but I have discussed with him that he may not have sufficient flow for healing of the index finger particularly with any amputation.  I will also obtain CT angio of the chest and left upper extremity to evaluate for any embolizing lesions and I have also discussed with his primary cardiologist ruling out a more centralized process.  After the CT scan I will see the patient back.     Waynetta Sandy MD [facility]

## 2022-04-06 ENCOUNTER — Telehealth: Payer: Self-pay | Admitting: *Deleted

## 2022-04-06 DIAGNOSIS — I4891 Unspecified atrial fibrillation: Secondary | ICD-10-CM

## 2022-04-06 NOTE — Telephone Encounter (Signed)
ECHO please  Thanks  Patrick Furbish, MD

## 2022-04-07 ENCOUNTER — Ambulatory Visit: Payer: Medicare Other | Attending: Cardiology

## 2022-04-07 DIAGNOSIS — I4891 Unspecified atrial fibrillation: Secondary | ICD-10-CM | POA: Diagnosis not present

## 2022-04-07 LAB — ECHOCARDIOGRAM COMPLETE
Area-P 1/2: 6.32 cm2
P 1/2 time: 787 msec
S' Lateral: 2.8 cm

## 2022-04-10 ENCOUNTER — Other Ambulatory Visit: Payer: Self-pay

## 2022-04-10 ENCOUNTER — Encounter (HOSPITAL_BASED_OUTPATIENT_CLINIC_OR_DEPARTMENT_OTHER): Payer: Self-pay | Admitting: Orthopedic Surgery

## 2022-04-10 DIAGNOSIS — I96 Gangrene, not elsewhere classified: Secondary | ICD-10-CM | POA: Diagnosis not present

## 2022-04-10 DIAGNOSIS — I776 Arteritis, unspecified: Secondary | ICD-10-CM

## 2022-04-10 NOTE — H&P (Signed)
Preoperative History & Physical Exam  Surgeon: Matt Holmes, MD  Diagnosis: gangrene left index finger  Planned Procedure: Procedure(s) (LRB): Left index finger partial amputation (Left)  History of Present Illness:   Patient is a 72 y.o. male with symptoms consistent with gangrene left index finger who presents for surgical intervention. The risks, benefits and alternatives of surgical intervention were discussed and informed consent was obtained prior to surgery.  Past Medical History:  Past Medical History:  Diagnosis Date   Cancer [facility])    Prostate cancer -dx 5'16   Dysrhythmia    A fib   Hypercholesterolemia    no meds due to adverse reaction   Hypertension    Temporal arteritis ([place]) 12/09/2021    Past Surgical History:  Past Surgical History:  Procedure Laterality Date   ARTERY BIOPSY Left 12/08/2021   Procedure: LEFT BIOPSY TEMPORAL ARTERY;  Surgeon: Waynetta Sandy, MD;  Location: [place];  Service: Vascular;  Laterality: Left;   BIOPSY PROSTATE     5'16   CERVICAL FUSION     COLONOSCOPY W/ POLYPECTOMY     x2 benign   IR CT HEAD LTD  05/17/2018   IR PERCUTANEOUS ART THROMBECTOMY/INFUSION INTRACRANIAL INC DIAG ANGIO  05/17/2018   IR US GUIDE VASC ACCESS RIGHT  05/17/2018   RADIOLOGY WITH ANESTHESIA N/A 05/17/2018   Procedure: IR WITH ANESTHESIA;  Surgeon: Corrie Mckusick, DO;  Location: [place];  Service: Anesthesiology;  Laterality: N/A;   ROBOT ASSISTED LAPAROSCOPIC RADICAL PROSTATECTOMY N/A 11/02/2014   Procedure: ROBOTIC ASSISTED LAPAROSCOPIC RADICAL PROSTATECTOMY LEVEL 1;  Surgeon: Raynelle Bring, MD;  [facility];  Service: Urology;  Laterality: N/A;   TONSILLECTOMY      Medications:  Prior to Admission medications   Medication Sig Start Date End Date Taking? Authorizing Provider  apixaban (ELIQUIS) 5 MG TABS tablet Take 1 tablet (5 mg total) by mouth 2 (two) times daily. 05/20/18  Yes Donzetta Starch, NP  diltiazem (CARDIZEM CD) 120 MG 24 hr capsule  Take 1 capsule (120 mg total) by mouth 2 (two) times daily. 12/17/20  Yes Fenton, Clint R, PA  pantoprazole (PROTONIX) 40 MG tablet Take 1 tablet (40 mg total) by mouth daily. 12/09/21  Yes Samuella Cota, MD  predniSONE (DELTASONE) 5 MG tablet Take 20 mg by mouth daily with breakfast. 03/29/22  Yes [provider]  rosuvastatin (CRESTOR) 20 MG tablet TAKE 1 TABLET BY MOUTH DAILY AT 6 PM. Patient taking differently: Take 20 mg by mouth daily at 6 PM. 10/03/21  Yes Jerline Pain, MD    Allergies:  Hctz [hydrochlorothiazide], Lipitor [atorvastatin calcium], and Pineapple  Review of Systems: Negative except per HPI.  Physical Exam: Alert and oriented, NAD Head and neck: no masses, normal alignment CV: pulse intact Pulm: no increased work of breathing, respirations even and unlabored Abdomen: non-distended Extremities: extremities warm and well perfused  LABS: Recent Results (from the past 2160 hour(s))  ECHOCARDIOGRAM COMPLETE     Status: None   Collection Time: 04/07/22 12:27 PM  Result Value Ref Range   S' Lateral 2.80 cm   Area-P 1/2 6.32 cm2   P 1/2 time 787 msec   Est EF 60 - 65%      Complete History and Physical exam available in the office notes  Orene Desanctis

## 2022-04-10 NOTE — Progress Notes (Signed)
   04/10/22 1417  PAT Phone Screen  Is the patient taking a GLP-1 receptor agonist? No  Do You Have Diabetes? No  Do You Have Hypertension? Yes  Have You Ever Been to the ER for Asthma? No  Have You Taken Oral Steroids in the Past 3 Months? No  Do you Take Phenteramine or any Other Diet Drugs? No  Recent  Lab Work, EKG, CXR? Yes  Do you have a history of heart problems? Yes  Cardiologist Name Candee Furbish see EPIC for EKG, ECHO  Have you ever had tests on your heart? Yes  What cardiac tests were performed? Echo;EKG;Labs  Results viewable: [facility] Media Tab  Any Recent Hospitalizations? No  Height '6\' 3"'$  (1.905 m)  Weight 91.6 kg  Pat Appointment Scheduled No  Reason for No Appointment Not Needed

## 2022-04-11 ENCOUNTER — Encounter (HOSPITAL_BASED_OUTPATIENT_CLINIC_OR_DEPARTMENT_OTHER)
Admission: RE | Disposition: A | Discharge status: Home / Self Care | Payer: Self-pay | Source: Home / Self Care | Attending: Orthopedic Surgery

## 2022-04-11 ENCOUNTER — Ambulatory Visit (HOSPITAL_BASED_OUTPATIENT_CLINIC_OR_DEPARTMENT_OTHER): Payer: Medicare Other

## 2022-04-11 ENCOUNTER — Ambulatory Visit (HOSPITAL_COMMUNITY): Payer: Medicare Other

## 2022-04-11 ENCOUNTER — Encounter (HOSPITAL_COMMUNITY): Payer: Self-pay

## 2022-04-11 ENCOUNTER — Ambulatory Visit (HOSPITAL_BASED_OUTPATIENT_CLINIC_OR_DEPARTMENT_OTHER)
Admission: RE | Admit: 2022-04-11 | Discharge: 2022-04-11 | Disposition: A | Discharge status: Home / Self Care | Payer: Medicare Other | Attending: Orthopedic Surgery | Admitting: Orthopedic Surgery

## 2022-04-11 ENCOUNTER — Ambulatory Visit (HOSPITAL_BASED_OUTPATIENT_CLINIC_OR_DEPARTMENT_OTHER): Payer: Medicare Other | Admitting: Anesthesiology

## 2022-04-11 ENCOUNTER — Other Ambulatory Visit: Payer: Self-pay

## 2022-04-11 ENCOUNTER — Encounter (HOSPITAL_BASED_OUTPATIENT_CLINIC_OR_DEPARTMENT_OTHER): Payer: Self-pay | Admitting: Orthopedic Surgery

## 2022-04-11 ENCOUNTER — Ambulatory Visit (HOSPITAL_COMMUNITY): Admission: RE | Admit: 2022-04-11 | Payer: Medicare Other | Source: Ambulatory Visit

## 2022-04-11 DIAGNOSIS — I10 Essential (primary) hypertension: Secondary | ICD-10-CM | POA: Insufficient documentation

## 2022-04-11 DIAGNOSIS — Z8673 Personal history of transient ischemic attack (TIA), and cerebral infarction without residual deficits: Secondary | ICD-10-CM | POA: Insufficient documentation

## 2022-04-11 DIAGNOSIS — I4891 Unspecified atrial fibrillation: Secondary | ICD-10-CM | POA: Insufficient documentation

## 2022-04-11 DIAGNOSIS — L98499 Non-pressure chronic ulcer of skin of other sites with unspecified severity: Secondary | ICD-10-CM | POA: Diagnosis not present

## 2022-04-11 DIAGNOSIS — I96 Gangrene, not elsewhere classified: Secondary | ICD-10-CM | POA: Diagnosis not present

## 2022-04-11 DIAGNOSIS — Z79899 Other long term (current) drug therapy: Secondary | ICD-10-CM | POA: Insufficient documentation

## 2022-04-11 DIAGNOSIS — I081 Rheumatic disorders of both mitral and tricuspid valves: Secondary | ICD-10-CM

## 2022-04-11 DIAGNOSIS — I998 Other disorder of circulatory system: Secondary | ICD-10-CM

## 2022-04-11 DIAGNOSIS — L089 Local infection of the skin and subcutaneous tissue, unspecified: Secondary | ICD-10-CM | POA: Diagnosis not present

## 2022-04-11 HISTORY — DX: Cardiac arrhythmia, unspecified: I49.9

## 2022-04-11 HISTORY — PX: AMPUTATION: SHX166

## 2022-04-11 HISTORY — DX: Essential (primary) hypertension: I10

## 2022-04-11 SURGERY — AMPUTATION DIGIT
Anesthesia: Monitor Anesthesia Care | Site: Index Finger | Laterality: Left

## 2022-04-11 MED ORDER — CEFAZOLIN SODIUM-DEXTROSE 2-4 GM/100ML-% IV SOLN
2.0000 g | INTRAVENOUS | Status: AC
  Administered 2022-04-11: 2 g via INTRAVENOUS

## 2022-04-11 MED ORDER — PROPOFOL 500 MG/50ML IV EMUL
INTRAVENOUS | Status: DC | PRN
  Administered 2022-04-11: 75 ug/kg/min via INTRAVENOUS

## 2022-04-11 MED ORDER — BACITRACIN ZINC 500 UNIT/GM EX OINT
TOPICAL_OINTMENT | CUTANEOUS | Status: DC | PRN
  Administered 2022-04-11: 1 via TOPICAL

## 2022-04-11 MED ORDER — HYDROMORPHONE HCL 1 MG/ML IJ SOLN: 0.2500 mg | INTRAMUSCULAR | Status: DC | PRN

## 2022-04-11 MED ORDER — ONDANSETRON HCL 4 MG/2ML IJ SOLN
INTRAMUSCULAR | Status: DC | PRN
  Administered 2022-04-11: 4 mg via INTRAVENOUS

## 2022-04-11 MED ORDER — BACITRACIN ZINC 500 UNIT/GM EX OINT
TOPICAL_OINTMENT | CUTANEOUS | Status: AC
  Filled 2022-04-11: qty 28.35

## 2022-04-11 MED ORDER — OXYCODONE-ACETAMINOPHEN 5-325 MG PO TABS: 1.0000 | ORAL_TABLET | Freq: Four times a day (QID) | ORAL | 0 refills | Status: AC | PRN

## 2022-04-11 MED ORDER — OXYCODONE HCL 5 MG PO TABS: 5.0000 mg | ORAL_TABLET | Freq: Once | ORAL | Status: DC | PRN

## 2022-04-11 MED ORDER — 0.9 % SODIUM CHLORIDE (POUR BTL) OPTIME
TOPICAL | Status: DC | PRN
  Administered 2022-04-11: 100 mL

## 2022-04-11 MED ORDER — FENTANYL CITRATE (PF) 100 MCG/2ML IJ SOLN
INTRAMUSCULAR | Status: DC | PRN
  Administered 2022-04-11: 50 ug via INTRAVENOUS

## 2022-04-11 MED ORDER — BUPIVACAINE HCL (PF) 0.5 % IJ SOLN
INTRAMUSCULAR | Status: AC
  Filled 2022-04-11: qty 60

## 2022-04-11 MED ORDER — LIDOCAINE HCL 1 % IJ SOLN
INTRAMUSCULAR | Status: DC | PRN
  Administered 2022-04-11: 10 mL

## 2022-04-11 MED ORDER — FENTANYL CITRATE (PF) 100 MCG/2ML IJ SOLN
INTRAMUSCULAR | Status: AC
  Filled 2022-04-11: qty 2

## 2022-04-11 MED ORDER — PROMETHAZINE HCL 25 MG/ML IJ SOLN: 6.2500 mg | INTRAMUSCULAR | Status: DC | PRN

## 2022-04-11 MED ORDER — LACTATED RINGERS IV SOLN: INTRAVENOUS | Status: DC

## 2022-04-11 MED ORDER — LIDOCAINE HCL (CARDIAC) PF 100 MG/5ML IV SOSY
PREFILLED_SYRINGE | INTRAVENOUS | Status: DC | PRN
  Administered 2022-04-11: 30 mg via INTRAVENOUS

## 2022-04-11 MED ORDER — CEFAZOLIN SODIUM-DEXTROSE 2-4 GM/100ML-% IV SOLN
INTRAVENOUS | Status: AC
  Filled 2022-04-11: qty 100

## 2022-04-11 MED ORDER — OXYCODONE HCL 5 MG/5ML PO SOLN: 5.0000 mg | Freq: Once | ORAL | Status: DC | PRN

## 2022-04-11 SURGICAL SUPPLY — 42 items
BANDAGE GAUZE 1X75IN STRL (MISCELLANEOUS) ×1 IMPLANT
BLADE MINI RND TIP GREEN BEAV (BLADE) IMPLANT
BLADE OSC/SAG .038X5.5 CUT EDG (BLADE) ×1 IMPLANT
BLADE SURG 15 STRL LF DISP TIS (BLADE) ×3 IMPLANT
BLADE SURG 15 STRL SS (BLADE) ×4
BNDG CMPR 5X2 CHSV 1 LYR STRL (GAUZE/BANDAGES/DRESSINGS)
BNDG CMPR 75X11 PLY HI ABS (MISCELLANEOUS) ×2
BNDG CMPR 9X4 STRL LF SNTH (GAUZE/BANDAGES/DRESSINGS) ×2
BNDG COHESIVE 1X5 TAN STRL LF (GAUZE/BANDAGES/DRESSINGS) ×1 IMPLANT
BNDG COHESIVE 2X5 TAN ST LF (GAUZE/BANDAGES/DRESSINGS) IMPLANT
BNDG ELASTIC 2X5.8 VLCR STR LF (GAUZE/BANDAGES/DRESSINGS) ×2 IMPLANT
BNDG ELASTIC 4X5.8 VLCR STR LF (GAUZE/BANDAGES/DRESSINGS) IMPLANT
BNDG ESMARK 4X9 LF (GAUZE/BANDAGES/DRESSINGS) ×2 IMPLANT
BNDG GAUZE 1X75IN STRL (MISCELLANEOUS) ×2
COVER BACK TABLE 60X90IN (DRAPES) ×2 IMPLANT
CUFF TOURN SGL QUICK 18X4 (TOURNIQUET CUFF) ×1 IMPLANT
DRAIN PENROSE 12X.25 LTX STRL (MISCELLANEOUS) IMPLANT
DRAPE EXTREMITY T 121X128X90 (DISPOSABLE) ×2 IMPLANT
DRAPE OEC MINIVIEW 54X84 (DRAPES) IMPLANT
DRAPE SURG 17X23 STRL (DRAPES) ×1 IMPLANT
DRSG EMULSION OIL 3X3 NADH (GAUZE/BANDAGES/DRESSINGS) ×1 IMPLANT
GAUZE SPONGE 4X4 12PLY STRL (GAUZE/BANDAGES/DRESSINGS) ×2 IMPLANT
GLOVE BIO SURGEON STRL SZ7.5 (GLOVE) ×2 IMPLANT
GLOVE BIOGEL PI IND STRL 7.0 (GLOVE) ×2 IMPLANT
GLOVE BIOGEL PI IND STRL 7.5 (GLOVE) ×2 IMPLANT
GOWN STRL REUS W/ TWL LRG LVL3 (GOWN DISPOSABLE) ×3 IMPLANT
GOWN STRL REUS W/TWL LRG LVL3 (GOWN DISPOSABLE) ×4
GOWN STRL REUS W/TWL XL LVL3 (GOWN DISPOSABLE) ×2 IMPLANT
NEEDLE HYPO 22GX1.5 SAFETY (NEEDLE) ×2 IMPLANT
NS IRRIG 1000ML POUR BTL (IV SOLUTION) ×2 IMPLANT
PACK BASIN DAY SURGERY FS (CUSTOM PROCEDURE TRAY) ×2 IMPLANT
PADDING CAST ABS COTTON 4X4 ST (CAST SUPPLIES) ×1 IMPLANT
SUT CHROMIC 4 0 P 3 18 (SUTURE) IMPLANT
SUT CHROMIC 4 0 PS 2 18 (SUTURE) ×1 IMPLANT
SUT ETHILON 4 0 PS 2 18 (SUTURE) IMPLANT
SUT VIC AB 4-0 P-3 18XBRD (SUTURE) IMPLANT
SUT VIC AB 4-0 P3 18 (SUTURE)
SYR 10ML LL (SYRINGE) ×2 IMPLANT
SYR BULB EAR ULCER 3OZ GRN STR (SYRINGE) ×2 IMPLANT
TOWEL GREEN STERILE FF (TOWEL DISPOSABLE) ×4 IMPLANT
TRAY DSU PREP LF (CUSTOM PROCEDURE TRAY) ×2 IMPLANT
UNDERPAD 30X36 HEAVY ABSORB (UNDERPADS AND DIAPERS) ×2 IMPLANT

## 2022-04-11 NOTE — Interval H&P Note (Signed)
History and Physical Interval Note:  04/11/2022 10:01 AM  Patrick Salinas  has presented today for surgery, with the diagnosis of gangrene left index finger.  The various methods of treatment have been discussed with the patient and family. After consideration of risks, benefits and other options for treatment, the patient has consented to  Procedure(s): Left index finger partial amputation (Left) as a surgical intervention.  The patient's history has been reviewed, patient examined, no change in status, stable for surgery.  I have reviewed the patient's chart and labs.  Questions were answered to the patient's satisfaction.    Orene Desanctis

## 2022-04-11 NOTE — Transfer of Care (Signed)
Immediate Anesthesia Transfer of Care Note  Patient: Patrick Salinas  Procedure(s) Performed: Left index finger partial amputation (Left: Index Finger)  Patient Location: PACU  Anesthesia Type:MAC  Level of Consciousness: drowsy and patient cooperative  Airway & Oxygen Therapy: Patient Spontanous Breathing and Patient connected to face mask oxygen  Post-op Assessment: Report given to RN and Post -op Vital signs reviewed and stable  Post vital signs: Reviewed and stable  Last Vitals:  Vitals Value Taken Time  BP    Temp    Pulse    Resp    SpO2      Last Pain:  Vitals:   04/11/22 0903  TempSrc: Oral  PainSc: 10-Worst pain ever      Patients Stated Pain Goal: 3 (85/50/15 8682)  Complications: No notable events documented.

## 2022-04-11 NOTE — Anesthesia Preprocedure Evaluation (Signed)
Anesthesia Evaluation  Patient identified by MRN, date of birth, ID band Patient awake    Reviewed: Allergy & Precautions, NPO status , Patient's Chart, lab work & pertinent test results  History of Anesthesia Complications Negative for: history of anesthetic complications  Airway Mallampati: II  TM Distance: >3 FB Neck ROM: Full    Dental  (+) Teeth Intact, Dental Advisory Given   Pulmonary neg pulmonary ROS   Pulmonary exam normal breath sounds clear to auscultation       Cardiovascular hypertension, Pt. on medications + dysrhythmias Atrial Fibrillation + Valvular Problems/Murmurs (Mild MR and TR)  Rhythm:Irregular Rate:Normal  Echo 03/2019 1. Left ventricular ejection fraction, by visual estimation, is 55 to 60%. The left ventricle has normal function. There is no left ventricular hypertrophy.  2. Left ventricular diastolic function could not be evaluated.  3. The left ventricle has no regional wall motion abnormalities.  4. Global right ventricle has normal systolic function.The right ventricular size is normal.  5. Left atrial size was mildly dilated.  6. Right atrial size was mildly dilated.  7. The mitral valve is normal in structure. Mild mitral valve  regurgitation. No evidence of mitral stenosis.  8. The tricuspid valve is normal in structure. Tricuspid valve regurgitation is mild.  9. The aortic valve is tricuspid. Aortic valve regurgitation is not visualized. Mild aortic valve sclerosis without stenosis.  10. The pulmonic valve was normal in structure. Pulmonic valve regurgitation is not visualized.  11. Normal pulmonary artery systolic pressure.  12. The inferior vena cava is dilated in size with >50% respiratory variability, suggesting right atrial pressure of 8 mmHg.  13. Normal LV function; mild biatrial enlargement; mild MR and TR.    Neuro/Psych CVA    GI/Hepatic negative GI ROS, Neg liver ROS,,,   Endo/Other  negative endocrine ROS    Renal/GU negative Renal ROS     Musculoskeletal   Abdominal   Peds  Hematology negative hematology ROS (+)   Anesthesia Other Findings   Reproductive/Obstetrics                             Anesthesia Physical Anesthesia Plan  ASA: 3  Anesthesia Plan: MAC   Post-op Pain Management: Minimal or no pain anticipated   Induction: Intravenous  PONV Risk Score and Plan: 1 and Treatment may vary due to age or medical condition and Propofol infusion  Airway Management Planned: Simple Face Mask  Additional Equipment:   Intra-op Plan:   Post-operative Plan:   Informed Consent: I have reviewed the patients History and Physical, chart, labs and discussed the procedure including the risks, benefits and alternatives for the proposed anesthesia with the patient or authorized representative who has indicated his/her understanding and acceptance.     Dental advisory given  Plan Discussed with: CRNA  Anesthesia Plan Comments:         Anesthesia Quick Evaluation

## 2022-04-11 NOTE — Anesthesia Postprocedure Evaluation (Signed)
Anesthesia Post Note  Patient: Patrick Salinas  Procedure(s) Performed: Left index finger partial amputation (Left: Index Finger)     Patient location during evaluation: PACU Anesthesia Type: MAC Level of consciousness: awake and alert Pain management: pain level controlled Vital Signs Assessment: post-procedure vital signs reviewed and stable Respiratory status: spontaneous breathing, nonlabored ventilation and respiratory function stable Cardiovascular status: blood pressure returned to baseline and stable Postop Assessment: no apparent nausea or vomiting Anesthetic complications: no   No notable events documented.  Last Vitals:  Vitals:   04/11/22 1130 04/11/22 1156  BP: 105/83 115/88  Pulse: 77 94  Resp: 15 18  Temp:  (!) 36.4 C  SpO2: 95% 94%    Last Pain:  Vitals:   04/11/22 1156  TempSrc: Oral  PainSc: 0-No pain                 Lynda Rainwater

## 2022-04-11 NOTE — Discharge Instructions (Addendum)
Orthopaedic Hand Surgery Discharge Instructions  WEIGHT BEARING STATUS: Non weight bearing on operative extremity  INCISION CARE: Keep dressing over your incision clean and dry until 3-5 days after surgery. You may shower by placing a waterproof covering over your dressing. Once dressing is removed, you may allow water to run over the incision and then place Band-Aids over incision. Do not scrub your incision or apply creams/lotions. Do not submerge your incision or swim for 3 weeks after surgery. Contact your surgeon or primary care doctor if you develop redness or drainage from your incision.   PAIN CONTROL: First line medications for post operative pain control are Tylenol (acetaminophen) and Motrin (ibuprofen) if you are able to take these medications. If you have been prescribed a medication these can be taken as breakthrough pain medications. Please note that some narcotic pain medication has acetaminophen added and you should never consume more than 4,'000mg'$  of acetaminophen in 24-hour period. Please note that if you are given Toradol (ketorolac) you should not take similar medications such as ibuprofen or naproxen.  DISCHARGE MEDICATIONS: If you have been prescribed medication it was sent electronically to your pharmacy. No changes have been made to your home medications.  ICE/ELEVATION: Ice and elevate your injured extremity as needed. Avoid direct contact of ice with skin.   BANDAGE FEELS TOO TIGHT: If your bandage feels too tight, first make sure you are elevating your fingers as much as possible. The outer layer of the bandage can be unwrapped and reapplied more loosely. If no improvement, you may carefully cut the inner layer longitudinally until the pressure has resolved and then rewrap the outer layer. If you are not comfortable with these instructions, please call the office and the bandage can be changed for you.   FOLLOW UP: You will be called after surgery with an appointment date  and time, however if you have not received a phone call within 3 days, please call during regular office hours at [phone] to schedule a post operative appointment.  Please Seek Medical Attention if: Call MD for: pain or pressure in chest, jaw, arm, back, neck  Call MD for: temperature greater than 101 F for more than 24 hrs Call MD for: difficulty breathing Call MD for: incision redness, bleeding, drainage  Call MD for: palpitations or feeling that the heart is racing  Call MD for: increased swelling in arm, leg, ankle, or abdomen  Call MD for: lightheadedness, dizziness, fainting Call 911 or go to ER for any medical emergency if you are not able to get in touch with your doctor   J. Sable Feil, MD Orthopaedic Hand Surgeon [facility] Office number: [phone] [address], [address] [place], [place] [address]   Post Anesthesia Home Care Instructions  Activity: Get plenty of rest for the remainder of the day. A responsible individual must stay with you for 24 hours following the procedure.  For the next 24 hours, DO NOT: -Drive a car -Paediatric nurse -Drink alcoholic beverages -Take any medication unless instructed by your physician -Make any legal decisions or sign important papers.  Meals: Start with liquid foods such as gelatin or soup. Progress to regular foods as tolerated. Avoid greasy, spicy, heavy foods. If nausea and/or vomiting occur, drink only clear liquids until the nausea and/or vomiting subsides. Call your physician if vomiting continues.  Special Instructions/Symptoms: Your throat may feel dry or sore from the anesthesia or the breathing tube placed in your throat during surgery. If this causes discomfort, gargle with warm  salt water. The discomfort should disappear within 24 hours.  If you had a scopolamine patch placed behind your ear for the management of post- operative nausea and/or vomiting:  1. The medication in the patch is effective for 72  hours, after which it should be removed.  Wrap patch in a tissue and discard in the trash. Wash hands thoroughly with soap and water. 2. You may remove the patch earlier than 72 hours if you experience unpleasant side effects which may include dry mouth, dizziness or visual disturbances. 3. Avoid touching the patch. Wash your hands with soap and water after contact with the patch.   Regional Anesthesia Blocks  1. Numbness or the inability to move the "blocked" extremity may last from 3-48 hours after placement. The length of time depends on the medication injected and your individual response to the medication. If the numbness is not going away after 48 hours, call your surgeon.  2. The extremity that is blocked will need to be protected until the numbness is gone and the  Strength has returned. Because you cannot feel it, you will need to take extra care to avoid injury. Because it may be weak, you may have difficulty moving it or using it. You may not know what position it is in without looking at it while the block is in effect.  3. For blocks in the legs and feet, returning to weight bearing and walking needs to be done carefully. You will need to wait until the numbness is entirely gone and the strength has returned. You should be able to move your leg and foot normally before you try and bear weight or walk. You will need someone to be with you when you first try to ensure you do not fall and possibly risk injury.  4. Bruising and tenderness at the needle site are common side effects and will resolve in a few days.  5. Persistent numbness or new problems with movement should be communicated to the surgeon or the [place] [phone] [place] [phone]).

## 2022-04-11 NOTE — Anesthesia Procedure Notes (Signed)
Procedure Name: MAC Date/Time: 04/11/2022 10:32 AM  Performed by: Signe Colt, CRNAPre-anesthesia Checklist: Patient identified, Emergency Drugs available, Suction available, Patient being monitored and Timeout performed Patient Re-evaluated:Patient Re-evaluated prior to induction Oxygen Delivery Method: Simple face mask

## 2022-04-11 NOTE — Op Note (Signed)
OPERATIVE NOTE  DATE OF PROCEDURE: 04/11/2022  SURGEONS:  Primary: Orene Desanctis, MD  PREOPERATIVE DIAGNOSIS: gangrene left index finger  POSTOPERATIVE DIAGNOSIS: Same  NAME OF PROCEDURE:   Left index finger partial amputation with traction neurectomies and direct closure  ANESTHESIA: Monitor Anesthesia Care + Local digital block  SKIN PREPARATION: Hibiclens  ESTIMATED BLOOD LOSS: Minimal  IMPLANTS: none  INDICATIONS:  Patrick Salinas is a 72 y.o. male who has the above preoperative diagnosis. The patient has decided to proceed with surgical intervention.  Risks, benefits and alternatives of operative management were discussed including, but not limited to, risks of anesthesia complications, infection, pain, persistent symptoms, stiffness, need for future surgery.  The patient understands, agrees and elects to proceed with surgery.    DESCRIPTION OF PROCEDURE: The patient was met in the pre-operative area and their identity was verified.  The operative location and laterality was also verified and marked.  The patient was brought to the OR and was placed supine on the table.  After repeat patient identification with the operative team anesthesia was provided and the patient was prepped and draped in the usual sterile fashion.  A final timeout was performed verifying the correction patient, procedure, location and laterality.  After adequate anesthesia and tourniquet inflated to 250 a fishmouth incision was made over the left index finger at the level of the middle phalanx.  This level was determined by warmth and capillary refill to the skin and absence of pain at this level.  Distal to this level it was anesthetic with nonhealing wounds and gangrenous tissue.  Full-thickness flaps were created.  An osteotomy was made in the middle phalanx and the edges were smoothed with a saw and rondure.  Traction neurectomies and cauterization of the digital vessels distal to this area was performed.  The flexor  digitorum profundus and extensor tendon was trimmed proximally.  The flaps were then mobilized care and primary closure of the left index finger amputation stump was performed with 4-0 chromic suture in a horizontal mattress and simple fashion.  A sterile bandage was applied.  The tourniquet was deflated.  The fingers were pink and warm and well-perfused which was stable compared to preoperative examination.  Of note the left middle finger had ischemic changes and some nonhealing wounds to the level of the middle phalanx of the left middle finger as well.  This was nonpainful and we were monitoring this.  Will continue to monitor this postoperatively.  The patient maintain perfusion to this digit postoperatively.  All counts were correct x 2.  The patient was awoken from anesthesia and brought to PACU for recovery in stable condition.   Matt Holmes, MD

## 2022-04-12 ENCOUNTER — Encounter (HOSPITAL_BASED_OUTPATIENT_CLINIC_OR_DEPARTMENT_OTHER): Payer: Self-pay | Admitting: Orthopedic Surgery

## 2022-04-13 LAB — SURGICAL PATHOLOGY, GROSS ONLY (NOT ARMC)

## 2022-04-14 ENCOUNTER — Ambulatory Visit (HOSPITAL_BASED_OUTPATIENT_CLINIC_OR_DEPARTMENT_OTHER): Payer: Medicare Other

## 2022-04-14 ENCOUNTER — Other Ambulatory Visit: Payer: Self-pay

## 2022-04-14 ENCOUNTER — Ambulatory Visit (HOSPITAL_BASED_OUTPATIENT_CLINIC_OR_DEPARTMENT_OTHER)
Admission: RE | Admit: 2022-04-14 | Discharge: 2022-04-14 | Disposition: A | Discharge status: Home / Self Care | Payer: Medicare Other | Source: Ambulatory Visit | Attending: Vascular Surgery | Admitting: Vascular Surgery

## 2022-04-14 DIAGNOSIS — I96 Gangrene, not elsewhere classified: Secondary | ICD-10-CM | POA: Diagnosis not present

## 2022-04-14 DIAGNOSIS — S68621A Partial traumatic transphalangeal amputation of left index finger, initial encounter: Secondary | ICD-10-CM | POA: Diagnosis not present

## 2022-04-14 MED ORDER — IOHEXOL 350 MG/ML SOLN
100.0000 mL | Freq: Once | INTRAVENOUS | Status: AC | PRN
  Administered 2022-04-14: 100 mL via INTRAVENOUS

## 2022-04-14 NOTE — Progress Notes (Signed)
Error

## 2022-04-18 ENCOUNTER — Ambulatory Visit: Payer: No Typology Code available for payment source | Attending: Cardiology | Admitting: Cardiology

## 2022-04-18 ENCOUNTER — Encounter: Payer: Self-pay | Admitting: Cardiology

## 2022-04-18 VITALS — BP 122/74 | HR 56 | Ht 75.0 in | Wt 207.8 lb

## 2022-04-18 DIAGNOSIS — I96 Gangrene, not elsewhere classified: Secondary | ICD-10-CM

## 2022-04-18 DIAGNOSIS — I4891 Unspecified atrial fibrillation: Secondary | ICD-10-CM

## 2022-04-18 DIAGNOSIS — H469 Unspecified optic neuritis: Secondary | ICD-10-CM | POA: Diagnosis not present

## 2022-04-18 DIAGNOSIS — I10 Essential (primary) hypertension: Secondary | ICD-10-CM | POA: Diagnosis not present

## 2022-04-18 NOTE — Patient Instructions (Signed)
Medication Instructions:  The current medical regimen is effective;  continue present plan and medications.  *If you need a refill on your cardiac medications before your next appointment, please call your pharmacy*  Follow-Up: At [facility], you and your health needs are our priority.  As part of our continuing mission to provide you with exceptional heart care, we have created designated Provider Care Teams.  These Care Teams include your primary Cardiologist (physician) and Advanced Practice Providers (APPs -  Physician Assistants and Nurse Practitioners) who all work together to provide you with the care you need, when you need it.  We recommend signing up for the patient portal called "MyChart".  Sign up information is provided on this After Visit Summary.  MyChart is used to connect with patients for Virtual Visits (Telemedicine).  Patients are able to view lab/test results, encounter notes, upcoming appointments, etc.  Non-urgent messages can be sent to your provider as well.   To learn more about what you can do with MyChart, go to [email or web address].    Your next appointment:   6 month(s)  Provider:   Mark Skains, MD     

## 2022-04-18 NOTE — Progress Notes (Signed)
Cardiology Office Note:    Date:  04/18/2022   ID:  Patrick Salinas, DOB 19-Jul-1950, MRN [id number]  PCP:  Collene Leyden, MD   [facility] Cardiologist:  Candee Furbish, MD     Referring MD: Collene Leyden, MD    History of Present Illness:    Patrick Salinas is a 72 y.o. male with longstanding persistent atrial fibrillation under reasonable rate control who over the last 6 months, starting in September started to develop symptoms of vasculitis.  Has seen rheumatology.  Had symptoms compatible with giant cell arteritis, vision loss.  Had been on longstanding prednisone therapy.  He has also been experiencing vasculitis type symptoms within his left hand, ischemic symptoms with finger necrosis occurring.  He has seen Dr. Donzetta Matters with vascular surgery.  He performed temporal artery biopsy in September 2023.  Interestingly, many of his inflammatory markers were unremarkable.  He also has an appointment set up with [facility] in February.  He had a CT of his left upper extremity performed with contrast.  There were no large vessel vascular abnormalities visualized.  Dr. Amil Amen, rheumatology, has been treating.  IL-6 inhibitor infusions.  Prednisone.  Has orthostatic type symptoms as well as he is tapering off the prednisone.  Has learned to pause for a few moments when standing.  Compression hose etc.  Riding stationary bike.      Past Medical History:  Diagnosis Date   Cancer [facility])    Prostate cancer -dx 5'16   Dysrhythmia    A fib   Hypercholesterolemia    no meds due to adverse reaction   Hypertension    Temporal arteritis ([facility]) 12/09/2021    Past Surgical History:  Procedure Laterality Date   AMPUTATION Left 04/11/2022   Procedure: Left index finger partial amputation;  Surgeon: Orene Desanctis, MD;  Location: [place];  Service: Orthopedics;  Laterality: Left;   ARTERY BIOPSY Left 12/08/2021   Procedure: LEFT BIOPSY TEMPORAL ARTERY;   Surgeon: Waynetta Sandy, MD;  Location: [place];  Service: Vascular;  Laterality: Left;   BIOPSY PROSTATE     5'16   CERVICAL FUSION     COLONOSCOPY W/ POLYPECTOMY     x2 benign   IR CT HEAD LTD  05/17/2018   IR PERCUTANEOUS ART THROMBECTOMY/INFUSION INTRACRANIAL INC DIAG ANGIO  05/17/2018   IR US GUIDE VASC ACCESS RIGHT  05/17/2018   RADIOLOGY WITH ANESTHESIA N/A 05/17/2018   Procedure: IR WITH ANESTHESIA;  Surgeon: Corrie Mckusick, DO;  Location: [place];  Service: Anesthesiology;  Laterality: N/A;   ROBOT ASSISTED LAPAROSCOPIC RADICAL PROSTATECTOMY N/A 11/02/2014   Procedure: ROBOTIC ASSISTED LAPAROSCOPIC RADICAL PROSTATECTOMY LEVEL 1;  Surgeon: Raynelle Bring, MD;  Location: [facility];  Service: Urology;  Laterality: N/A;   TONSILLECTOMY      Current Medications: Current Meds  Medication Sig   apixaban (ELIQUIS) 5 MG TABS tablet Take 1 tablet (5 mg total) by mouth 2 (two) times daily.   diltiazem (CARDIZEM CD) 120 MG 24 hr capsule Take 1 capsule (120 mg total) by mouth 2 (two) times daily.   pantoprazole (PROTONIX) 40 MG tablet Take 1 tablet (40 mg total) by mouth daily.   predniSONE (DELTASONE) 5 MG tablet Take 20 mg by mouth daily with breakfast.   rosuvastatin (CRESTOR) 20 MG tablet TAKE 1 TABLET BY MOUTH DAILY AT 6 PM. (Patient taking differently: Take 17.5 mg by mouth daily at 6 PM. On 3rd dose)  Allergies:   Hctz [hydrochlorothiazide], Lipitor [atorvastatin calcium], and Pineapple   Social History   Socioeconomic History   Marital status: Married    Spouse name: Not on file   Number of children: Not on file   Years of education: Not on file   Highest education level: Not on file  Occupational History   Not on file  Tobacco Use   Smoking status: Never   Smokeless tobacco: Never  Vaping Use   Vaping Use: Never used  Substance and Sexual Activity   Alcohol use: Not Currently    Comment: one drink daily   Drug use: No   Sexual activity: Not on file  Other Topics  Concern   Not on file  Social History Narrative   Not on file   Social Determinants of Health   Financial Resource Strain: Not on file  Food Insecurity: Not on file  Transportation Needs: Not on file  Physical Activity: Not on file  Stress: Not on file  Social Connections: Not on file      ROS:   Please see the history of present illness.    No fevers chills nausea vomiting syncope bleeding all other systems reviewed and are negative.  EKGs/Labs/Other Studies Reviewed:    The following studies were reviewed today:  ECHO 04/07/22:    1. Left ventricular ejection fraction, by estimation, is 60 to 65%. The  left ventricle has normal function. The left ventricle has no regional  wall motion abnormalities. Left ventricular diastolic parameters are  indeterminate.   2. Right ventricular systolic function is normal. The right ventricular  size is normal. There is normal pulmonary artery systolic pressure.   3. Left atrial size was mildly dilated.   4. The mitral valve is normal in structure. Mild mitral valve  regurgitation. No evidence of mitral stenosis.   5. The aortic valve is normal in structure. Aortic valve regurgitation is  mild. Aortic valve sclerosis is present, with no evidence of aortic valve  stenosis.   6. Rhythm is atrial fibrillation.   ZIO 2021: Atrial fibrillation average heart rate 86 bpm, minimum 52 bpm, maximum 197 bpm No pauses Overall well controlled atrial fibrillation  EKG:  The ekg ordered today demonstrates atrial fibrillation heart rate 85 bpm no ischemic changes  Recent Labs: 12/06/2021: ALT 25 12/07/2021: BUN 15; Creatinine, Ser 0.96; Hemoglobin 17.1; Platelets 259; Potassium 3.8; Sodium 137  Recent Lipid Panel    Component Value Date/Time   CHOL 217 (H) 05/18/2018 0617   TRIG 66 05/18/2018 0617   HDL 50 05/18/2018 0617   CHOLHDL 4.3 05/18/2018 0617   VLDL 13 05/18/2018 0617   LDLCALC 154 (H) 05/18/2018 0617     Risk  Assessment/Calculations:    CHA2DS2-VASc Score = 4   This indicates a 4.8% annual risk of stroke. The patient's score is based upon: CHF History: 0 HTN History: 1 Diabetes History: 0 Stroke History: 2 Vascular Disease History: 0 Age Score: 1 Gender Score: 0               Physical Exam:    VS:  BP 122/74   Pulse (!) 56   Ht '6\' 3"'$  (1.905 m)   Wt 207 lb 12.8 oz (94.3 kg)   SpO2 96%   BMI 25.97 kg/m     Wt Readings from Last 3 Encounters:  04/18/22 207 lb 12.8 oz (94.3 kg)  04/11/22 204 lb 2.3 oz (92.6 kg)  04/05/22 202 lb (91.6 kg)  GEN:  Well nourished, well developed in no acute distress HEENT: Normal NECK: No JVD; No carotid bruits LYMPHATICS: No lymphadenopathy CARDIAC: Irregularly irregular, no murmurs, rubs, gallops RESPIRATORY:  Clear to auscultation without rales, wheezing or rhonchi  ABDOMEN: Soft, non-tender, non-distended MUSCULOSKELETAL:  No edema; left tip of index finger amputated SKIN: Warm and dry NEUROLOGIC:  Alert and oriented x 3 PSYCHIATRIC:  Normal affect   ASSESSMENT:    1. Atrial fibrillation, unspecified type ([place])   2. Essential hypertension   3. Finger necrosis (H[facility])   4. Optic perineuritis    PLAN:    In order of problems listed above:  Persistent longstanding atrial fibrillation - Continue with diltiazem 120 mg twice daily.  Long-acting.  Overall well rate controlled. -Normal ejection fraction  Chronic anticoagulation -Continue with Eliquis 5 mg twice a day.  No issues.  No bleeding.  Giant cell arteritis - Inflammatory markers such as ESR were normal.  Rheumatology following.  High-dose prednisone, IL-6 infusion.  Slowly starting to taper on prednisone.  Has noticed some orthostatic type symptoms.  Not addisonian.  He is being careful.  Compression hose, conservative measures. - Microvascular component to vasculitis as well given his necrotic index finger left hand.  CT scan of upper extremity reviewed, no large artery  abnormalities.  Echocardiogram shows no evidence of embolic source.  Also he has been on anticoagulation with Eliquis. - Has upcoming appointment in early February with [facility] for further review.  Time spent 45 minutes with review of medical records discussion with he and Langley Gauss his wife          Medication Adjustments/Labs and Tests Ordered: Current medicines are reviewed at length with the patient today.  Concerns regarding medicines are outlined above.  Orders Placed This Encounter  Procedures   EKG 12-Lead   No orders of the defined types were placed in this encounter.   Patient Instructions  Medication Instructions:  The current medical regimen is effective;  continue present plan and medications.  *If you need a refill on your cardiac medications before your next appointment, please call your pharmacy*  Follow-Up: At [facility], you and your health needs are our priority.  As part of our continuing mission to provide you with exceptional heart care, we have created designated Provider Care Teams.  These Care Teams include your primary Cardiologist (physician) and Advanced Practice Providers (APPs -  Physician Assistants and Nurse Practitioners) who all work together to provide you with the care you need, when you need it.  We recommend signing up for the patient portal called "MyChart".  Sign up information is provided on this After Visit Summary.  MyChart is used to connect with patients for Virtual Visits (Telemedicine).  Patients are able to view lab/test results, encounter notes, upcoming appointments, etc.  Non-urgent messages can be sent to your provider as well.   To learn more about what you can do with MyChart, go to [email or web address].    Your next appointment:   6 month(s)  Provider:   Candee Furbish, MD        Signed, Candee Furbish, MD  04/18/2022 10:38 AM    [place]

## 2022-04-19 ENCOUNTER — Other Ambulatory Visit (HOSPITAL_COMMUNITY): Payer: Medicare Other

## 2022-04-19 ENCOUNTER — Ambulatory Visit: Payer: Medicare Other | Admitting: Vascular Surgery

## 2022-04-21 ENCOUNTER — Encounter: Payer: Self-pay | Admitting: Vascular Surgery

## 2022-04-21 DIAGNOSIS — M316 Other giant cell arteritis: Secondary | ICD-10-CM | POA: Diagnosis not present

## 2022-04-25 ENCOUNTER — Encounter (HOSPITAL_COMMUNITY): Payer: Self-pay

## 2022-04-25 ENCOUNTER — Ambulatory Visit (HOSPITAL_COMMUNITY)
Admission: RE | Admit: 2022-04-25 | Discharge: 2022-04-25 | Disposition: A | Discharge status: Home / Self Care | Payer: No Typology Code available for payment source | Source: Ambulatory Visit | Attending: Vascular Surgery | Admitting: Vascular Surgery

## 2022-04-25 DIAGNOSIS — I96 Gangrene, not elsewhere classified: Secondary | ICD-10-CM

## 2022-04-25 DIAGNOSIS — I776 Arteritis, unspecified: Secondary | ICD-10-CM

## 2022-04-26 ENCOUNTER — Ambulatory Visit: Payer: No Typology Code available for payment source | Admitting: Vascular Surgery

## 2022-04-26 DIAGNOSIS — I998 Other disorder of circulatory system: Secondary | ICD-10-CM | POA: Diagnosis not present

## 2022-04-26 DIAGNOSIS — M316 Other giant cell arteritis: Secondary | ICD-10-CM | POA: Diagnosis not present

## 2022-04-26 DIAGNOSIS — H469 Unspecified optic neuritis: Secondary | ICD-10-CM | POA: Diagnosis not present

## 2022-04-28 DIAGNOSIS — Z13220 Encounter for screening for lipoid disorders: Secondary | ICD-10-CM | POA: Diagnosis not present

## 2022-04-28 DIAGNOSIS — Z79899 Other long term (current) drug therapy: Secondary | ICD-10-CM | POA: Diagnosis not present

## 2022-05-03 DIAGNOSIS — Z7951 Long term (current) use of inhaled steroids: Secondary | ICD-10-CM | POA: Diagnosis not present

## 2022-05-03 DIAGNOSIS — M316 Other giant cell arteritis: Secondary | ICD-10-CM | POA: Diagnosis not present

## 2022-05-03 DIAGNOSIS — Z79899 Other long term (current) drug therapy: Secondary | ICD-10-CM | POA: Diagnosis not present

## 2022-05-08 DIAGNOSIS — H468 Other optic neuritis: Secondary | ICD-10-CM | POA: Diagnosis not present

## 2022-05-08 DIAGNOSIS — H2513 Age-related nuclear cataract, bilateral: Secondary | ICD-10-CM | POA: Diagnosis not present

## 2022-05-08 DIAGNOSIS — H401410 Capsular glaucoma with pseudoexfoliation of lens, right eye, stage unspecified: Secondary | ICD-10-CM | POA: Diagnosis not present

## 2022-05-08 DIAGNOSIS — M316 Other giant cell arteritis: Secondary | ICD-10-CM | POA: Diagnosis not present

## 2022-05-08 DIAGNOSIS — H532 Diplopia: Secondary | ICD-10-CM | POA: Diagnosis not present

## 2022-05-09 ENCOUNTER — Telehealth: Payer: Self-pay | Admitting: Cardiology

## 2022-05-09 ENCOUNTER — Ambulatory Visit: Payer: Medicare Other | Attending: Cardiology

## 2022-05-09 ENCOUNTER — Encounter: Payer: Self-pay | Admitting: Cardiology

## 2022-05-09 DIAGNOSIS — I4891 Unspecified atrial fibrillation: Secondary | ICD-10-CM

## 2022-05-09 NOTE — Progress Notes (Unsigned)
Enrolled patient for a 14 day Zio XT  monitor to be mailed to patients home  °

## 2022-05-09 NOTE — Telephone Encounter (Signed)
He texted me about his afib heart rate and feeling of SOB.  Let's send him out a Zio monitor for AFIB.   He states that rates have been challenging to control.  Thanks  Candee Furbish, MD

## 2022-05-11 ENCOUNTER — Telehealth (HOSPITAL_COMMUNITY): Payer: Self-pay | Admitting: *Deleted

## 2022-05-11 DIAGNOSIS — I4891 Unspecified atrial fibrillation: Secondary | ICD-10-CM

## 2022-05-11 NOTE — Telephone Encounter (Signed)
Patient's wife call today very concerned about the pt. Patient spoke last week w dr Marlou Porch regarding having elevated rates with recommendation of zio monitor but wife believes he is much worse than last week.   She said they were at the [facility] clinic the beginning of last week so he rode 8 hours in the car on last Wed.  Pt had syncopal episode last Thursday They did not seek emergency care with syncopal episode last week as felt ok after and they felt it must have been related to orthostatic hypotension. Denies hitting head. Since Thursday pt  has been extremely short of breath with any exertion, his HRs are in the 140s with minimal exertion (even tying his shoes) HRs at rest are 80 - he is to the point he is barely getting out of bed b/c of weakness/SOB. The pt has been taking an extra 111m of cardizem at lunchtime with little effect on his symptoms/rates. Discussed above with Dr. SMarlou Porchwho recommends pt proceed to ER for further work-up/assessment. Pt wife informed of recommendations and states she will discuss with patient.

## 2022-05-22 DIAGNOSIS — M316 Other giant cell arteritis: Secondary | ICD-10-CM | POA: Diagnosis not present

## 2022-05-25 ENCOUNTER — Telehealth: Payer: Self-pay | Admitting: Cardiology

## 2022-05-25 DIAGNOSIS — I4821 Permanent atrial fibrillation: Secondary | ICD-10-CM

## 2022-05-25 MED ORDER — APIXABAN 5 MG PO TABS: 5.0000 mg | ORAL_TABLET | Freq: Two times a day (BID) | ORAL | 11 refills | Status: AC

## 2022-05-25 NOTE — Telephone Encounter (Signed)
Sent in refills to [facility] for Eliquis '5mg'$  BID, D-60 with 11 refills.   Candee Furbish, MD

## 2022-06-02 DIAGNOSIS — I4891 Unspecified atrial fibrillation: Secondary | ICD-10-CM | POA: Diagnosis not present

## 2022-06-03 ENCOUNTER — Ambulatory Visit (HOSPITAL_COMMUNITY): Payer: No Typology Code available for payment source

## 2022-06-03 ENCOUNTER — Encounter (HOSPITAL_COMMUNITY): Payer: Self-pay

## 2022-06-05 DIAGNOSIS — Z0000 Encounter for general adult medical examination without abnormal findings: Secondary | ICD-10-CM | POA: Diagnosis not present

## 2022-06-05 DIAGNOSIS — Z8673 Personal history of transient ischemic attack (TIA), and cerebral infarction without residual deficits: Secondary | ICD-10-CM | POA: Diagnosis not present

## 2022-06-05 DIAGNOSIS — E7800 Pure hypercholesterolemia, unspecified: Secondary | ICD-10-CM | POA: Diagnosis not present

## 2022-06-05 DIAGNOSIS — M316 Other giant cell arteritis: Secondary | ICD-10-CM | POA: Diagnosis not present

## 2022-06-05 DIAGNOSIS — I4891 Unspecified atrial fibrillation: Secondary | ICD-10-CM | POA: Diagnosis not present

## 2022-06-05 DIAGNOSIS — D6869 Other thrombophilia: Secondary | ICD-10-CM | POA: Diagnosis not present

## 2022-06-05 DIAGNOSIS — Z8546 Personal history of malignant neoplasm of prostate: Secondary | ICD-10-CM | POA: Diagnosis not present

## 2022-06-05 DIAGNOSIS — I10 Essential (primary) hypertension: Secondary | ICD-10-CM | POA: Diagnosis not present

## 2022-06-05 DIAGNOSIS — R7303 Prediabetes: Secondary | ICD-10-CM | POA: Diagnosis not present

## 2022-06-06 DIAGNOSIS — L98491 Non-pressure chronic ulcer of skin of other sites limited to breakdown of skin: Secondary | ICD-10-CM | POA: Diagnosis not present

## 2022-06-06 DIAGNOSIS — Z7952 Long term (current) use of systemic steroids: Secondary | ICD-10-CM | POA: Diagnosis not present

## 2022-06-06 DIAGNOSIS — E663 Overweight: Secondary | ICD-10-CM | POA: Diagnosis not present

## 2022-06-06 DIAGNOSIS — Z79899 Other long term (current) drug therapy: Secondary | ICD-10-CM | POA: Diagnosis not present

## 2022-06-06 DIAGNOSIS — Z6826 Body mass index (BMI) 26.0-26.9, adult: Secondary | ICD-10-CM | POA: Diagnosis not present

## 2022-06-06 DIAGNOSIS — M316 Other giant cell arteritis: Secondary | ICD-10-CM | POA: Diagnosis not present

## 2022-06-07 ENCOUNTER — Ambulatory Visit: Payer: Medicare Other | Admitting: Cardiology

## 2022-06-13 ENCOUNTER — Ambulatory Visit
Payer: No Typology Code available for payment source | Attending: Cardiovascular Disease | Admitting: Cardiovascular Disease

## 2022-06-13 ENCOUNTER — Encounter: Payer: Self-pay | Admitting: Cardiovascular Disease

## 2022-06-13 VITALS — BP 112/70 | HR 84 | Ht 75.0 in | Wt 216.6 lb

## 2022-06-13 DIAGNOSIS — I4891 Unspecified atrial fibrillation: Secondary | ICD-10-CM | POA: Diagnosis not present

## 2022-06-13 DIAGNOSIS — I4821 Permanent atrial fibrillation: Secondary | ICD-10-CM

## 2022-06-13 NOTE — Patient Instructions (Signed)
Medication Instructions:  Your physician recommends that you continue on your current medications as directed. Please refer to the Current Medication list given to you today.  *If you need a refill on your cardiac medications before your next appointment, please call your pharmacy*   Follow-Up: At [facility], you and your health needs are our priority.  As part of our continuing mission to provide you with exceptional heart care, we have created designated Provider Care Teams.  These Care Teams include your primary Cardiologist (physician) and Advanced Practice Providers (APPs -  Physician Assistants and Nurse Practitioners) who all work together to provide you with the care you need, when you need it.  We recommend signing up for the patient portal called "MyChart".  Sign up information is provided on this After Visit Summary.  MyChart is used to connect with patients for Virtual Visits (Telemedicine).  Patients are able to view lab/test results, encounter notes, upcoming appointments, etc.  Non-urgent messages can be sent to your provider as well.   To learn more about what you can do with MyChart, go to [email or web address].    Your next appointment:   4-6 week(s)  Provider:   Candee Furbish, MD

## 2022-06-13 NOTE — Assessment & Plan Note (Signed)
History of persistent A-fib since March 2020 status post embolic stroke with neurointerventional rescue on Eliquis oral anticoagulation.  He is followed by Dr. Marlou Porch.  His 2D echo is fairly normal without mildly dilated left atrium.  He had a Zio patch performed by Dr. Marlou Porch that showed heart rates that ranged from 45-195 with an average of 95 and no pauses.  He is fairly symptomatic as well as orthostatic by symptoms.  I am hesitant to begin him on additional negative chronotropic agents for fear of both lowering his blood pressure and his low and heart rate.  I am going to refer him to Dr. Lars Mage for consideration of A-fib ablation.

## 2022-06-13 NOTE — Progress Notes (Signed)
06/13/2022 KAHLEN HEYER   1950/07/01  [id number]  Primary Physician Collene Leyden, MD Primary Cardiologist: Lorretta Harp MD Lupe Carney, [place]  HPI:  Patrick Salinas is a 72 y.o. mildly overweight married Caucasian male father of 2 adopted sons, grandfather of 5 grandchildren is accompanied by his wife Langley Gauss today.  He is a cardiology patient of Dr. Candee Furbish who last saw him 04/18/2022.  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  He does have a history of treated hyperlipidemia.  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  He developed A-fib in March 2020 and has had an embolic stroke as a result of this requiring neurointerventional rescue.  He has been on Eliquis since that time.  He is also developed temporal arteritis/giant cell arteritis recently seen at [facility] in [place] and placed on high-dose steroids which he is tapering.  He does complain of decreased exercise tolerance and some orthostatic symptoms.   Current Meds  Medication Sig   apixaban (ELIQUIS) 5 MG TABS tablet Take 1 tablet (5 mg total) by mouth 2 (two) times daily.   diltiazem (CARDIZEM CD) 120 MG 24 hr capsule Take 1 capsule (120 mg total) by mouth 2 (two) times daily.   pantoprazole (PROTONIX) 40 MG tablet Take 1 tablet (40 mg total) by mouth daily.   predniSONE (DELTASONE) 5 MG tablet Take 5 mg by mouth daily with breakfast.   rosuvastatin (CRESTOR) 20 MG tablet TAKE 1 TABLET BY MOUTH DAILY AT 6 PM. (Patient taking differently: Take 17.5 mg by mouth daily at 6 PM. On 3rd dose)   Tocilizumab (ACTEMRA) 80 MG/4ML SOLN injection Inject 6 mg into the vein once.     Allergies  Allergen Reactions   Hctz [Hydrochlorothiazide] Other (See Comments)    Myalgia/ raises blood sugar   Lipitor [Atorvastatin Calcium] Other (See Comments)    Myalgia/ raises blood sugar   Pineapple Swelling    Tongue swelling     Social History   Socioeconomic History   Marital status: Married    Spouse name: Not on file   Number of children: Not on file   Years of education: Not on file   Highest education level: Not on file  Occupational History   Not on file  Tobacco Use   Smoking status: Never   Smokeless tobacco: Never  Vaping Use   Vaping Use: Never used  Substance and Sexual Activity   Alcohol use: Not Currently    Comment: one drink daily   Drug use: No   Sexual activity: Not on file  Other Topics Concern   Not on file  Social History Narrative   Not on file   Social Determinants of Health   Financial Resource Strain: Not on file  Food Insecurity: Not on file  Transportation Needs: Not on file  Physical Activity: Not on file  Stress: Not on file  Social Connections: Not on file  Intimate Partner Violence: Not on file     Review of Systems: General: negative for chills, fever, night sweats or weight changes.  Cardiovascular: negative for chest pain, dyspnea on exertion, edema, orthopnea, palpitations, paroxysmal nocturnal dyspnea or shortness of breath Dermatological: negative for rash Respiratory: negative for cough or wheezing Urologic: negative for hematuria Abdominal: negative for nausea, vomiting, diarrhea, bright red blood per rectum, melena, or hematemesis Neurologic:  negative for visual changes, syncope, or dizziness All other systems reviewed and are otherwise negative except as noted above.    Blood pressure 112/70, pulse 84, height 6\' 3"  (1.905 m), weight 216 lb 9.6 oz (98.2 kg).  General appearance: alert and no distress Neck: no adenopathy, no carotid bruit, no JVD, supple, symmetrical, trachea midline, and thyroid not enlarged, symmetric, no tenderness/mass/nodules Lungs: clear to auscultation bilaterally Heart: irregularly irregular rhythm Extremities: extremities normal, atraumatic, no cyanosis or edema Pulses: 2+ and symmetric Skin: Skin color, texture,  turgor normal. No rashes or lesions Neurologic: Grossly normal  EKG atrial fibrillation with a ventricular sponsor of 84.  I personally reviewed this EKG.  ASSESSMENT AND PLAN:   Atrial fibrillation ([place]) History of persistent A-fib since March 2020 status post embolic stroke with neurointerventional rescue on Eliquis oral anticoagulation.  He is followed by Dr. Marlou Porch.  His 2D echo is fairly normal without mildly dilated left atrium.  He had a Zio patch performed by Dr. Marlou Porch that showed heart rates that ranged from 45-195 with an average of 95 and no pauses.  He is fairly symptomatic as well as orthostatic by symptoms.  I am hesitant to begin him on additional negative chronotropic agents for fear of both lowering his blood pressure and his low and heart rate.  I am going to refer him to Dr. Lars Mage for consideration of A-fib ablation.     Lorretta Harp MD FACP,FACC,FAHA, [facility] 06/13/2022 3:55 PM

## 2022-06-19 DIAGNOSIS — Z111 Encounter for screening for respiratory tuberculosis: Secondary | ICD-10-CM | POA: Diagnosis not present

## 2022-06-19 DIAGNOSIS — M316 Other giant cell arteritis: Secondary | ICD-10-CM | POA: Diagnosis not present

## 2022-06-19 DIAGNOSIS — Z79899 Other long term (current) drug therapy: Secondary | ICD-10-CM | POA: Diagnosis not present

## 2022-06-19 DIAGNOSIS — M0579 Rheumatoid arthritis with rheumatoid factor of multiple sites without organ or systems involvement: Secondary | ICD-10-CM | POA: Diagnosis not present

## 2022-06-19 DIAGNOSIS — R5383 Other fatigue: Secondary | ICD-10-CM | POA: Diagnosis not present

## 2022-06-19 NOTE — Addendum Note (Signed)
Addended by: Zebedee Iba on: 06/19/2022 08:41 AM   Modules accepted: Orders

## 2022-07-03 DIAGNOSIS — M316 Other giant cell arteritis: Secondary | ICD-10-CM | POA: Diagnosis not present

## 2022-07-03 DIAGNOSIS — H468 Other optic neuritis: Secondary | ICD-10-CM | POA: Diagnosis not present

## 2022-07-03 DIAGNOSIS — H3581 Retinal edema: Secondary | ICD-10-CM | POA: Diagnosis not present

## 2022-07-03 DIAGNOSIS — H43813 Vitreous degeneration, bilateral: Secondary | ICD-10-CM | POA: Diagnosis not present

## 2022-07-03 DIAGNOSIS — H532 Diplopia: Secondary | ICD-10-CM | POA: Diagnosis not present

## 2022-07-03 DIAGNOSIS — H2513 Age-related nuclear cataract, bilateral: Secondary | ICD-10-CM | POA: Diagnosis not present

## 2022-07-03 DIAGNOSIS — H401411 Capsular glaucoma with pseudoexfoliation of lens, right eye, mild stage: Secondary | ICD-10-CM | POA: Diagnosis not present

## 2022-07-03 DIAGNOSIS — H53122 Transient visual loss, left eye: Secondary | ICD-10-CM | POA: Diagnosis not present

## 2022-07-07 ENCOUNTER — Ambulatory Visit
Payer: No Typology Code available for payment source | Attending: Cardiovascular Disease | Admitting: Cardiovascular Disease

## 2022-07-07 ENCOUNTER — Encounter: Payer: Self-pay | Admitting: Cardiovascular Disease

## 2022-07-07 VITALS — BP 124/80 | HR 91 | Ht 75.0 in | Wt 215.2 lb

## 2022-07-07 DIAGNOSIS — I63411 Cerebral infarction due to embolism of right middle cerebral artery: Secondary | ICD-10-CM

## 2022-07-07 DIAGNOSIS — I4819 Other persistent atrial fibrillation: Secondary | ICD-10-CM | POA: Diagnosis not present

## 2022-07-07 NOTE — Patient Instructions (Signed)
Medication Instructions:  Your physician recommends that you continue on your current medications as directed. Please refer to the Current Medication list given to you today. *If you need a refill on your cardiac medications before your next appointment, please call your pharmacy*   Follow-Up: At [facility], you and your health needs are our priority.  As part of our continuing mission to provide you with exceptional heart care, we have created designated Provider Care Teams.  These Care Teams include your primary Cardiologist (physician) and Advanced Practice Providers (APPs -  Physician Assistants and Nurse Practitioners) who all work together to provide you with the care you need, when you need it.  We recommend signing up for the patient portal called "MyChart".  Sign up information is provided on this After Visit Summary.  MyChart is used to connect with patients for Virtual Visits (Telemedicine).  Patients are able to view lab/test results, encounter notes, upcoming appointments, etc.  Non-urgent messages can be sent to your provider as well.   To learn more about what you can do with MyChart, go to [email or web address].    Your next appointment:   6 month(s)  Provider:   Augustus Mealor, MD  

## 2022-07-07 NOTE — Progress Notes (Signed)
Electrophysiology Office Note:    Date:  07/07/2022   ID:  Patrick Salinas, DOB 1950-08-17, MRN [id number]  PCP:  Irven Coe, MD   [facility] Providers Cardiologist:  Donato Schultz, MD     Referring MD: Runell Gess, MD   History of Present Illness:    PHI AVANS is a 72 y.o. 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  He developed atrial fibrillation in March 2020.  Prior to developing atrial fibrillation he was very active and played tennis regularly.  He had an embolic stroke as a consequence of atrial fibrillation and has been on Eliquis since that time.  He has been on a steroid taper for temporal arteritis.  Temporal has been complicated by left first and second digit necrosis.  He had a partial amputation of his left first finger.  He has had poor wound healing; the amputation occurred in January and is still not yet completely healed.  He has lost a significant amount of muscle mass on steroids.   He does notice increased fatigue and his heart rate increases more abruptly than I did prior to the diagnosis of temporal arteritis.   Past Medical History:  Diagnosis Date   Cancer    Prostate cancer -dx 5'16   Dysrhythmia    A fib   Hypercholesterolemia    no meds due to adverse reaction   Hypertension    Temporal arteritis 12/09/2021    Past Surgical History:  Procedure Laterality Date   AMPUTATION Left 04/11/2022   Procedure: Left index finger partial amputation;  Surgeon: Gomez Cleverly, MD;  Location: [facility];  Service: Orthopedics;  Laterality: Left;   ARTERY BIOPSY Left 12/08/2021   Procedure: LEFT BIOPSY TEMPORAL ARTERY;  Surgeon: Maeola Harman, MD;  Location: [facility] OR;  Service: Vascular;  Laterality: Left;   BIOPSY PROSTATE     5'16   CERVICAL FUSION      COLONOSCOPY W/ POLYPECTOMY     x2 benign   IR CT HEAD LTD  05/17/2018   IR PERCUTANEOUS ART THROMBECTOMY/INFUSION INTRACRANIAL INC DIAG ANGIO  05/17/2018   IR US GUIDE VASC ACCESS RIGHT  05/17/2018   RADIOLOGY WITH ANESTHESIA N/A 05/17/2018   Procedure: IR WITH ANESTHESIA;  Surgeon: Gilmer Mor, DO;  Location: [facility] OR;  Service: Anesthesiology;  Laterality: N/A;   ROBOT ASSISTED LAPAROSCOPIC RADICAL PROSTATECTOMY N/A 11/02/2014   Procedure: ROBOTIC ASSISTED LAPAROSCOPIC RADICAL PROSTATECTOMY LEVEL 1;  Surgeon: Heloise Purpura, MD;  Location: [facility];  Service: Urology;  Laterality: N/A;   TONSILLECTOMY      Current Medications: Current Meds  Medication Sig   apixaban (ELIQUIS) 5 MG TABS tablet Take 1 tablet (5 mg total) by mouth 2 (two) times daily.   diltiazem (CARDIZEM CD) 120 MG 24 hr capsule Take 1 capsule (120 mg total) by mouth 2 (two) times daily. (Patient taking differently: Take 120 mg by mouth daily.)   pantoprazole (PROTONIX) 40 MG tablet Take 1 tablet (40 mg total) by mouth daily.   predniSONE (DELTASONE) 5 MG tablet Take 20 mg by mouth daily with breakfast.   predniSONE (DELTASONE) 5 MG tablet Take 5 mg by mouth daily with breakfast.   rosuvastatin (CRESTOR) 20 MG tablet TAKE 1 TABLET BY MOUTH DAILY AT 6 PM.   Tocilizumab (ACTEMRA) 80 MG/4ML SOLN injection Inject 6 mg into the vein once.  Allergies:   Hctz [hydrochlorothiazide], Lipitor [atorvastatin calcium], and Pineapple   Social and Family History: Reviewed in Epic  ROS:   Please see the history of present illness.    All other systems reviewed and are negative.  EKGs/Labs/Other Studies Reviewed Today:    Echocardiogram:  04/07/2022  1. Left ventricular ejection fraction, by estimation, is 60 to 65%. The  left ventricle has normal function. The left ventricle has no regional  wall motion abnormalities. Left ventricular diastolic parameters are  indeterminate.   2. Right ventricular systolic function is normal. The  right ventricular  size is normal. There is normal pulmonary artery systolic pressure.   3. Left atrial size was mildly dilated.   4. The mitral valve is normal in structure. Mild mitral valve  regurgitation. No evidence of mitral stenosis.   5. The aortic valve is normal in structure. Aortic valve regurgitation is  mild. Aortic valve sclerosis is present, with no evidence of aortic valve  stenosis.   6. Rhythm is atrial fibrillation.    Monitors:  ZioPatch 05/2022 AF, HR 49-200 bpm, avg 95 Rare PVCs  Stress testing:   Advanced imaging:    EKG:  Last EKG results: today - Atrial fibrillation with controlled ventricular rates   Recent Labs: 12/06/2021: ALT 25 12/07/2021: BUN 15; Creatinine, Ser 0.96; Hemoglobin 17.1; Platelets 259; Potassium 3.8; Sodium 137     Physical Exam:    VS:  BP 124/80   Pulse 91   Ht 6\' 3"  (1.905 m)   Wt 215 lb 3.2 oz (97.6 kg)   SpO2 97%   BMI 26.90 kg/m     Wt Readings from Last 3 Encounters:  07/07/22 215 lb 3.2 oz (97.6 kg)  06/13/22 216 lb 9.6 oz (98.2 kg)  04/18/22 207 lb 12.8 oz (94.3 kg)     GEN:  Well nourished, well developed in no acute distress CARDIAC: iRRR, no murmurs, rubs, gallops RESPIRATORY:  Normal work of breathing MUSCULOSKELETAL: no edema    ASSESSMENT & PLAN:    Atrial fibrillation Longstanding persistent, minimally symptomatic I suspect atrial fibrillation is contributing to some small extent to his current fatigue and shortness of breath, which has really come on after being diagnosed with temporal arteritis and taking high-dose steroids I am not confident he was a great candidate for atrial fibrillation ablation at this time --there is a higher chance that it will not be as successful due to the prolonged duration of A-fib, and there is increased risk due to prolonged steroid use I will see him again in 6 months Continue diltiazem 120 and apixaban 5  Temporal arteritis Continue steroids and  tocilizumab            Medication Adjustments/Labs and Tests Ordered: Current medicines are reviewed at length with the patient today.  Concerns regarding medicines are outlined above.  Orders Placed This Encounter  Procedures   EKG 12-Lead   No orders of the defined types were placed in this encounter.    Signed, Maurice Small, MD  07/07/2022 3:06 PM    [facility]

## 2022-07-10 ENCOUNTER — Telehealth: Payer: Self-pay | Admitting: Cardiology

## 2022-07-10 DIAGNOSIS — M0579 Rheumatoid arthritis with rheumatoid factor of multiple sites without organ or systems involvement: Secondary | ICD-10-CM | POA: Diagnosis not present

## 2022-07-10 DIAGNOSIS — R5383 Other fatigue: Secondary | ICD-10-CM | POA: Diagnosis not present

## 2022-07-10 DIAGNOSIS — Z111 Encounter for screening for respiratory tuberculosis: Secondary | ICD-10-CM | POA: Diagnosis not present

## 2022-07-10 DIAGNOSIS — Z79899 Other long term (current) drug therapy: Secondary | ICD-10-CM | POA: Diagnosis not present

## 2022-07-10 DIAGNOSIS — M316 Other giant cell arteritis: Secondary | ICD-10-CM | POA: Diagnosis not present

## 2022-07-10 NOTE — Telephone Encounter (Signed)
  Per MyChart scheduling message:   I thought that this appointment had been cancelled because  I saw Dr Nelly Laurence. let me know. thanks Lenyx Shamberger   Patient is scheduled to see Dr Anne Fu on 4/24. Does patient need to keep appointment?

## 2022-07-10 NOTE — Telephone Encounter (Signed)
Left message for pt per Dr Anne Fu, he does not need to keep the appt as scheduled 4/24.

## 2022-07-17 DIAGNOSIS — M316 Other giant cell arteritis: Secondary | ICD-10-CM | POA: Diagnosis not present

## 2022-07-19 ENCOUNTER — Ambulatory Visit: Payer: Medicare Other | Admitting: Cardiology

## 2022-08-15 DIAGNOSIS — M316 Other giant cell arteritis: Secondary | ICD-10-CM | POA: Diagnosis not present

## 2022-08-31 DIAGNOSIS — Z79899 Other long term (current) drug therapy: Secondary | ICD-10-CM | POA: Diagnosis not present

## 2022-08-31 DIAGNOSIS — Z7952 Long term (current) use of systemic steroids: Secondary | ICD-10-CM | POA: Diagnosis not present

## 2022-08-31 DIAGNOSIS — E663 Overweight: Secondary | ICD-10-CM | POA: Diagnosis not present

## 2022-08-31 DIAGNOSIS — L98491 Non-pressure chronic ulcer of skin of other sites limited to breakdown of skin: Secondary | ICD-10-CM | POA: Diagnosis not present

## 2022-08-31 DIAGNOSIS — M316 Other giant cell arteritis: Secondary | ICD-10-CM | POA: Diagnosis not present

## 2022-08-31 DIAGNOSIS — Z6826 Body mass index (BMI) 26.0-26.9, adult: Secondary | ICD-10-CM | POA: Diagnosis not present

## 2022-09-05 DIAGNOSIS — I4891 Unspecified atrial fibrillation: Secondary | ICD-10-CM | POA: Diagnosis not present

## 2022-09-05 DIAGNOSIS — Z8673 Personal history of transient ischemic attack (TIA), and cerebral infarction without residual deficits: Secondary | ICD-10-CM | POA: Diagnosis not present

## 2022-09-05 DIAGNOSIS — I10 Essential (primary) hypertension: Secondary | ICD-10-CM | POA: Diagnosis not present

## 2022-09-05 DIAGNOSIS — M316 Other giant cell arteritis: Secondary | ICD-10-CM | POA: Diagnosis not present

## 2022-09-05 DIAGNOSIS — E7800 Pure hypercholesterolemia, unspecified: Secondary | ICD-10-CM | POA: Diagnosis not present

## 2022-09-05 DIAGNOSIS — R7303 Prediabetes: Secondary | ICD-10-CM | POA: Diagnosis not present

## 2022-09-05 DIAGNOSIS — D6869 Other thrombophilia: Secondary | ICD-10-CM | POA: Diagnosis not present

## 2022-09-13 DIAGNOSIS — Z79899 Other long term (current) drug therapy: Secondary | ICD-10-CM | POA: Diagnosis not present

## 2022-09-13 DIAGNOSIS — M316 Other giant cell arteritis: Secondary | ICD-10-CM | POA: Diagnosis not present

## 2022-09-13 DIAGNOSIS — Z111 Encounter for screening for respiratory tuberculosis: Secondary | ICD-10-CM | POA: Diagnosis not present

## 2022-10-09 DIAGNOSIS — H468 Other optic neuritis: Secondary | ICD-10-CM | POA: Diagnosis not present

## 2022-10-09 DIAGNOSIS — H401411 Capsular glaucoma with pseudoexfoliation of lens, right eye, mild stage: Secondary | ICD-10-CM | POA: Diagnosis not present

## 2022-10-09 DIAGNOSIS — H43813 Vitreous degeneration, bilateral: Secondary | ICD-10-CM | POA: Diagnosis not present

## 2022-10-09 DIAGNOSIS — H53122 Transient visual loss, left eye: Secondary | ICD-10-CM | POA: Diagnosis not present

## 2022-10-09 DIAGNOSIS — H532 Diplopia: Secondary | ICD-10-CM | POA: Diagnosis not present

## 2022-10-09 DIAGNOSIS — R519 Headache, unspecified: Secondary | ICD-10-CM | POA: Diagnosis not present

## 2022-10-09 DIAGNOSIS — H2513 Age-related nuclear cataract, bilateral: Secondary | ICD-10-CM | POA: Diagnosis not present

## 2022-10-09 DIAGNOSIS — M316 Other giant cell arteritis: Secondary | ICD-10-CM | POA: Diagnosis not present

## 2022-10-09 DIAGNOSIS — H3581 Retinal edema: Secondary | ICD-10-CM | POA: Diagnosis not present

## 2022-10-11 DIAGNOSIS — M316 Other giant cell arteritis: Secondary | ICD-10-CM | POA: Diagnosis not present

## 2022-11-08 DIAGNOSIS — M316 Other giant cell arteritis: Secondary | ICD-10-CM | POA: Diagnosis not present

## 2022-12-02 ENCOUNTER — Other Ambulatory Visit: Payer: Self-pay | Admitting: Cardiology

## 2022-12-04 MED ORDER — ROSUVASTATIN CALCIUM 20 MG PO TABS: 20.0000 mg | ORAL_TABLET | Freq: Every day | ORAL | 4 refills | Status: AC

## 2022-12-06 DIAGNOSIS — Z79899 Other long term (current) drug therapy: Secondary | ICD-10-CM | POA: Diagnosis not present

## 2022-12-06 DIAGNOSIS — M316 Other giant cell arteritis: Secondary | ICD-10-CM | POA: Diagnosis not present

## 2022-12-11 DIAGNOSIS — Z6825 Body mass index (BMI) 25.0-25.9, adult: Secondary | ICD-10-CM | POA: Diagnosis not present

## 2022-12-11 DIAGNOSIS — Z7952 Long term (current) use of systemic steroids: Secondary | ICD-10-CM | POA: Diagnosis not present

## 2022-12-11 DIAGNOSIS — Z79899 Other long term (current) drug therapy: Secondary | ICD-10-CM | POA: Diagnosis not present

## 2022-12-11 DIAGNOSIS — E663 Overweight: Secondary | ICD-10-CM | POA: Diagnosis not present

## 2022-12-11 DIAGNOSIS — L98491 Non-pressure chronic ulcer of skin of other sites limited to breakdown of skin: Secondary | ICD-10-CM | POA: Diagnosis not present

## 2022-12-11 DIAGNOSIS — M316 Other giant cell arteritis: Secondary | ICD-10-CM | POA: Diagnosis not present

## 2022-12-15 ENCOUNTER — Ambulatory Visit: Payer: Medicare Other | Attending: Cardiology | Admitting: Cardiology

## 2022-12-15 ENCOUNTER — Encounter: Payer: Self-pay | Admitting: Cardiology

## 2022-12-15 VITALS — BP 128/70 | HR 84 | Ht 75.0 in | Wt 203.0 lb

## 2022-12-15 DIAGNOSIS — I4811 Longstanding persistent atrial fibrillation: Secondary | ICD-10-CM | POA: Diagnosis present

## 2022-12-15 DIAGNOSIS — D6869 Other thrombophilia: Secondary | ICD-10-CM | POA: Diagnosis present

## 2022-12-15 DIAGNOSIS — M316 Other giant cell arteritis: Secondary | ICD-10-CM | POA: Insufficient documentation

## 2022-12-15 NOTE — Patient Instructions (Signed)
Medication Instructions:  The current medical regimen is effective;  continue present plan and medications.  *If you need a refill on your cardiac medications before your next appointment, please call your pharmacy*  Follow-Up: At [facility], you and your health needs are our priority.  As part of our continuing mission to provide you with exceptional heart care, we have created designated Provider Care Teams.  These Care Teams include your primary Cardiologist (physician) and Advanced Practice Providers (APPs -  Physician Assistants and Nurse Practitioners) who all work together to provide you with the care you need, when you need it.  We recommend signing up for the patient portal called "MyChart".  Sign up information is provided on this After Visit Summary.  MyChart is used to connect with patients for Virtual Visits (Telemedicine).  Patients are able to view lab/test results, encounter notes, upcoming appointments, etc.  Non-urgent messages can be sent to your provider as well.   To learn more about what you can do with MyChart, go to [email or web address].    Your next appointment:   6 month(s)  Provider:   Candee Furbish, MD

## 2022-12-15 NOTE — Progress Notes (Signed)
Cardiology Office Note:  .   Date:  12/15/2022  ID:  ZYAD GLESSNER, DOB Jan 02, 1951, MRN [id number] PCP: Irven Coe, MD  [facility] Providers Cardiologist:  Donato Schultz, MD    History of Present Illness: .   JERRAD PIONTEK is a 72 y.o. male LP AFIB, Temp arteritis, prior CVA.  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 The patient's atrial fibrillation is currently managed with diltiazem 120 mg for rate control and apixaban 5 mg twice a day for chronic anticoagulation. The patient's temporal arteritis is closely monitored by ophthalmology and has been treated with steroids and tocilizumab. The patient's most recent echocardiogram showed an ejection fraction of 60 to 65% with mild mitral valve regurgitation. The patient's cardiac monitor showed atrial fibrillation with a heart rate ranging from 49 to 200 beats per minute, with an average of 95 beats per minute and rare PVCs. The patient's previous hemoglobin was 17.1, and creatinine was 0.96. The patient also has a history of an embolic stroke. The patient is active and enjoys hobbies such as model trains and drawing (dogs, etc).          Studies Reviewed: Marland Kitchen        LABS Hb: 15.8 Cr: 1.08 ESR: Negative CRP: Negative LDL: 79  DIAGNOSTIC Echocardiogram: Ejection fraction 60-65%, mild mitral valve regurgitation (04/07/2022) Cardiac monitor: Atrial fibrillation with heart rate 49-200 bpm, average 95 bpm, rare PVCs (05/2022) ECG: Atrial fibrillation with controlled ventricular rates  Risk Assessment/Calculations:    CHA2DS2-VASc Score = 4   This indicates a 4.8% annual risk of stroke. The patient's score is based upon: CHF History: 0 HTN History: 1 Diabetes History: 0 Stroke History: 2 Vascular Disease History: 0 Age Score: 1 Gender Score: 0             Physical Exam:   VS:  BP 128/70   Pulse 84   Ht 6\' 3"  (1.905 m)   Wt 203 lb (92.1 kg)   SpO2 97%   BMI 25.37 kg/m    Wt Readings from Last 3 Encounters:  12/15/22 203 lb (92.1 kg)  07/07/22 215 lb 3.2 oz (97.6 kg)  06/13/22 216 lb 9.6 oz (98.2 kg)    GEN: Well nourished, well developed in no acute distress NECK: No JVD; No carotid bruits CARDIAC: IRRR, no murmurs, rubs, gallops RESPIRATORY:  Clear to auscultation without rales, wheezing or rhonchi  ABDOMEN: Soft, non-tender, non-distended EXTREMITIES:  No edema; No deformity   ASSESSMENT AND PLAN: .    Assessment and Plan    Atrial Fibrillation Longstanding persistent minimally symptomatic atrial fibrillation. Rate controlled with Diltiazem 120mg  and anticoagulated with Apixaban 5mg  BID. Echocardiogram showed EF of 60-65% with mild mitral valve regurgitation. Cardiac monitor showed heart rate 49-200bpm, average 95bpm with rare PVCs. -Continue Diltiazem 120mg  and Apixaban 5mg  BID.  Temporal Arteritis Followed closely by ophthalmology with steroids and Tocilizumab (Actemra). Patient reports no current use of Prednisone and plans to continue Actemra until December. -Continue Actemra as directed by ophthalmology.  General Health Maintenance -Schedule follow-up in 6 months. Contact office if any issues arise before then.            Dispo: 6 mth  Signed, Donato Schultz, MD

## 2023-01-03 DIAGNOSIS — M316 Other giant cell arteritis: Secondary | ICD-10-CM | POA: Diagnosis not present

## 2023-01-11 DIAGNOSIS — Z23 Encounter for immunization: Secondary | ICD-10-CM | POA: Diagnosis not present

## 2023-01-23 DIAGNOSIS — H3581 Retinal edema: Secondary | ICD-10-CM | POA: Diagnosis not present

## 2023-01-23 DIAGNOSIS — H0102B Squamous blepharitis left eye, upper and lower eyelids: Secondary | ICD-10-CM | POA: Diagnosis not present

## 2023-01-23 DIAGNOSIS — M316 Other giant cell arteritis: Secondary | ICD-10-CM | POA: Diagnosis not present

## 2023-01-23 DIAGNOSIS — R519 Headache, unspecified: Secondary | ICD-10-CM | POA: Diagnosis not present

## 2023-01-23 DIAGNOSIS — H401411 Capsular glaucoma with pseudoexfoliation of lens, right eye, mild stage: Secondary | ICD-10-CM | POA: Diagnosis not present

## 2023-01-23 DIAGNOSIS — H43813 Vitreous degeneration, bilateral: Secondary | ICD-10-CM | POA: Diagnosis not present

## 2023-01-23 DIAGNOSIS — H2513 Age-related nuclear cataract, bilateral: Secondary | ICD-10-CM | POA: Diagnosis not present

## 2023-01-23 DIAGNOSIS — H532 Diplopia: Secondary | ICD-10-CM | POA: Diagnosis not present

## 2023-01-23 DIAGNOSIS — H53122 Transient visual loss, left eye: Secondary | ICD-10-CM | POA: Diagnosis not present

## 2023-01-23 DIAGNOSIS — H468 Other optic neuritis: Secondary | ICD-10-CM | POA: Diagnosis not present

## 2023-01-23 DIAGNOSIS — H0102A Squamous blepharitis right eye, upper and lower eyelids: Secondary | ICD-10-CM | POA: Diagnosis not present

## 2023-01-31 DIAGNOSIS — M316 Other giant cell arteritis: Secondary | ICD-10-CM | POA: Diagnosis not present

## 2023-03-01 DIAGNOSIS — M316 Other giant cell arteritis: Secondary | ICD-10-CM | POA: Diagnosis not present

## 2023-03-06 DIAGNOSIS — H53122 Transient visual loss, left eye: Secondary | ICD-10-CM | POA: Diagnosis not present

## 2023-03-06 DIAGNOSIS — M316 Other giant cell arteritis: Secondary | ICD-10-CM | POA: Diagnosis not present

## 2023-03-06 DIAGNOSIS — H401410 Capsular glaucoma with pseudoexfoliation of lens, right eye, stage unspecified: Secondary | ICD-10-CM | POA: Diagnosis not present

## 2023-03-06 DIAGNOSIS — H3581 Retinal edema: Secondary | ICD-10-CM | POA: Diagnosis not present

## 2023-03-06 DIAGNOSIS — R519 Headache, unspecified: Secondary | ICD-10-CM | POA: Diagnosis not present

## 2023-03-06 DIAGNOSIS — H532 Diplopia: Secondary | ICD-10-CM | POA: Diagnosis not present

## 2023-03-06 DIAGNOSIS — H0102A Squamous blepharitis right eye, upper and lower eyelids: Secondary | ICD-10-CM | POA: Diagnosis not present

## 2023-03-06 DIAGNOSIS — H2513 Age-related nuclear cataract, bilateral: Secondary | ICD-10-CM | POA: Diagnosis not present

## 2023-03-06 DIAGNOSIS — H0102B Squamous blepharitis left eye, upper and lower eyelids: Secondary | ICD-10-CM | POA: Diagnosis not present

## 2023-03-06 DIAGNOSIS — H468 Other optic neuritis: Secondary | ICD-10-CM | POA: Diagnosis not present

## 2023-03-06 DIAGNOSIS — H43813 Vitreous degeneration, bilateral: Secondary | ICD-10-CM | POA: Diagnosis not present

## 2023-04-05 DIAGNOSIS — E663 Overweight: Secondary | ICD-10-CM | POA: Diagnosis not present

## 2023-04-05 DIAGNOSIS — M316 Other giant cell arteritis: Secondary | ICD-10-CM | POA: Diagnosis not present

## 2023-04-05 DIAGNOSIS — Z7952 Long term (current) use of systemic steroids: Secondary | ICD-10-CM | POA: Diagnosis not present

## 2023-04-05 DIAGNOSIS — L98491 Non-pressure chronic ulcer of skin of other sites limited to breakdown of skin: Secondary | ICD-10-CM | POA: Diagnosis not present

## 2023-04-05 DIAGNOSIS — Z6825 Body mass index (BMI) 25.0-25.9, adult: Secondary | ICD-10-CM | POA: Diagnosis not present

## 2023-04-05 DIAGNOSIS — Z79899 Other long term (current) drug therapy: Secondary | ICD-10-CM | POA: Diagnosis not present

## 2023-04-12 DIAGNOSIS — H2511 Age-related nuclear cataract, right eye: Secondary | ICD-10-CM | POA: Diagnosis not present

## 2023-04-17 DIAGNOSIS — Z8546 Personal history of malignant neoplasm of prostate: Secondary | ICD-10-CM | POA: Diagnosis not present

## 2023-04-17 DIAGNOSIS — I10 Essential (primary) hypertension: Secondary | ICD-10-CM | POA: Diagnosis not present

## 2023-04-17 DIAGNOSIS — R7303 Prediabetes: Secondary | ICD-10-CM | POA: Diagnosis not present

## 2023-04-17 DIAGNOSIS — E7800 Pure hypercholesterolemia, unspecified: Secondary | ICD-10-CM | POA: Diagnosis not present

## 2023-04-17 DIAGNOSIS — I4891 Unspecified atrial fibrillation: Secondary | ICD-10-CM | POA: Diagnosis not present

## 2023-04-23 DIAGNOSIS — H2512 Age-related nuclear cataract, left eye: Secondary | ICD-10-CM | POA: Diagnosis not present

## 2023-04-25 DIAGNOSIS — R17 Unspecified jaundice: Secondary | ICD-10-CM | POA: Diagnosis not present

## 2023-04-25 DIAGNOSIS — M5030 Other cervical disc degeneration, unspecified cervical region: Secondary | ICD-10-CM | POA: Diagnosis not present

## 2023-04-25 DIAGNOSIS — R7303 Prediabetes: Secondary | ICD-10-CM | POA: Diagnosis not present

## 2023-04-25 DIAGNOSIS — Z8673 Personal history of transient ischemic attack (TIA), and cerebral infarction without residual deficits: Secondary | ICD-10-CM | POA: Diagnosis not present

## 2023-04-25 DIAGNOSIS — E7800 Pure hypercholesterolemia, unspecified: Secondary | ICD-10-CM | POA: Diagnosis not present

## 2023-04-25 DIAGNOSIS — I4891 Unspecified atrial fibrillation: Secondary | ICD-10-CM | POA: Diagnosis not present

## 2023-04-25 DIAGNOSIS — I10 Essential (primary) hypertension: Secondary | ICD-10-CM | POA: Diagnosis not present

## 2023-04-25 DIAGNOSIS — M316 Other giant cell arteritis: Secondary | ICD-10-CM | POA: Diagnosis not present

## 2023-04-25 DIAGNOSIS — Z8546 Personal history of malignant neoplasm of prostate: Secondary | ICD-10-CM | POA: Diagnosis not present

## 2023-04-25 DIAGNOSIS — D582 Other hemoglobinopathies: Secondary | ICD-10-CM | POA: Diagnosis not present

## 2023-04-26 DIAGNOSIS — H2512 Age-related nuclear cataract, left eye: Secondary | ICD-10-CM | POA: Diagnosis not present

## 2023-05-08 DIAGNOSIS — D582 Other hemoglobinopathies: Secondary | ICD-10-CM | POA: Diagnosis not present

## 2023-06-12 DIAGNOSIS — Z7952 Long term (current) use of systemic steroids: Secondary | ICD-10-CM | POA: Diagnosis not present

## 2023-06-12 DIAGNOSIS — Z6825 Body mass index (BMI) 25.0-25.9, adult: Secondary | ICD-10-CM | POA: Diagnosis not present

## 2023-06-12 DIAGNOSIS — M316 Other giant cell arteritis: Secondary | ICD-10-CM | POA: Diagnosis not present

## 2023-06-12 DIAGNOSIS — Z79899 Other long term (current) drug therapy: Secondary | ICD-10-CM | POA: Diagnosis not present

## 2023-06-12 DIAGNOSIS — L98491 Non-pressure chronic ulcer of skin of other sites limited to breakdown of skin: Secondary | ICD-10-CM | POA: Diagnosis not present

## 2023-06-12 DIAGNOSIS — E663 Overweight: Secondary | ICD-10-CM | POA: Diagnosis not present

## 2023-06-14 ENCOUNTER — Encounter: Payer: Self-pay | Admitting: Cardiology

## 2023-06-14 ENCOUNTER — Ambulatory Visit: Payer: Medicare Other | Attending: Cardiology | Admitting: Cardiology

## 2023-06-14 VITALS — BP 134/82 | HR 85 | Ht 75.0 in | Wt 201.0 lb

## 2023-06-14 DIAGNOSIS — M316 Other giant cell arteritis: Secondary | ICD-10-CM

## 2023-06-14 DIAGNOSIS — I63411 Cerebral infarction due to embolism of right middle cerebral artery: Secondary | ICD-10-CM | POA: Diagnosis not present

## 2023-06-14 DIAGNOSIS — I4811 Longstanding persistent atrial fibrillation: Secondary | ICD-10-CM

## 2023-06-14 NOTE — Patient Instructions (Signed)
 Medication Instructions:  The current medical regimen is effective;  continue present plan and medications.  *If you need a refill on your cardiac medications before your next appointment, please call your pharmacy*  Follow-Up: At [facility], you and your health needs are our priority.  As part of our continuing mission to provide you with exceptional heart care, we have created designated Provider Care Teams.  These Care Teams include your primary Cardiologist (physician) and Advanced Practice Providers (APPs -  Physician Assistants and Nurse Practitioners) who all work together to provide you with the care you need, when you need it.  We recommend signing up for the patient portal called "MyChart".  Sign up information is provided on this After Visit Summary.  MyChart is used to connect with patients for Virtual Visits (Telemedicine).  Patients are able to view lab/test results, encounter notes, upcoming appointments, etc.  Non-urgent messages can be sent to your provider as well.   To learn more about what you can do with MyChart, go to [email or web address].    Your next appointment:   6 month(s)  Provider:   Donato Schultz, MD       1st Floor: - Lobby - Registration  - Pharmacy  - Lab - Cafe  2nd Floor: - PV Lab - Diagnostic Testing (echo, CT, nuclear med)  3rd Floor: - Vacant  4th Floor: - TCTS (cardiothoracic surgery) - AFib Clinic - Structural Heart Clinic - Vascular Surgery  - Vascular Ultrasound  5th Floor: - HeartCare Cardiology (general and EP) - Clinical Pharmacy for coumadin, hypertension, lipid, weight-loss medications, and med management appointments    Valet parking services will be available as well.

## 2023-06-14 NOTE — Progress Notes (Signed)
 Cardiology Office Note:  .   Date:  06/14/2023  ID:  Patrick Salinas, DOB 17-Dec-1950, MRN [id number] PCP: Irven Coe, MD   [facility] Providers Cardiologist:  Donato Schultz, MD     History of Present Illness: .   Patrick Salinas is a 73 y.o. male Discussed the use of AI scribe software for clinical note transcription with the patient, who gave verbal consent to proceed.  History of Present Illness Patrick Salinas "Patrick Salinas" is a 73 year old male with permanent atrial fibrillation and temporal arteritis who presents for follow-up.  He has permanent atrial fibrillation and is on Eliquis 5 mg twice daily for anticoagulation and Cardizem 120 mg daily for rate control. He has experienced hypotension in the past. His CHADS-VASc score is 4, and a cardiac monitor in 2024 showed atrial fibrillation 100% of the time with an average heart rate of 95 beats per minute. An echocardiogram in 2024 revealed an ejection fraction of 65% with mild mitral valve regurgitation.  He has a history of temporal arteritis and has been closely followed by ophthalmology. He was previously treated with steroids and Actemra but is currently off these medications. He experienced dense cataracts due to prednisone use, which have since been treated, allowing him to resume activities such as playing tennis. He has some ongoing vasculitis symptoms in his fingers but notes improvement since discontinuing Actemra in December. He experienced a rebound of symptoms two months after stopping Actemra, which was managed with a brief course of prednisone.  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 He reports a significant improvement in his quality of life since managing his temporal arteritis and cataracts, allowing him to plan a trip to [place] in June. His current medications also include Crestor 20 mg daily for hyperlipidemia, with a recent LDL of 82. His lab results show a  hemoglobin A1c of 5.9, hemoglobin of 16.3, creatinine of 0.9, potassium of 4.5, and ALT of 22. Prior ESR and CRP were negative in the context of his temporal arteritis.  He has a family history of autoimmune diseases, and his hearing and sense of smell have been affected, with some improvement in smell recently. He attributes his condition to his [place] ancestry, which he discussed with specialists at the [facility].        Studies Reviewed: Marland Kitchen   EKG Interpretation Date/Time:  Thursday June 14 2023 08:19:39 EDT Ventricular Rate:  85 PR Interval:    QRS Duration:  78 QT Interval:  364 QTC Calculation: 433 R Axis:   82  Text Interpretation: Atrial fibrillation Septal infarct (cited on or before 06-Apr-2019) When compared with ECG of 17-Dec-2020 11:43, Nonspecific T wave abnormality now evident in Lateral leads Confirmed by Donato Schultz ([id number]) on 06/14/2023 8:36:22 AM    Results LABS LDL: 82 (04/17/2023) HbA1c: 5.9 (04/17/2023) Hb: 16.3 (04/17/2023) Cr: 0.9 (04/17/2023) K: 4.5 (04/17/2023) ALT: 22 (04/17/2023) ESR: negative (04/17/2023) CRP: negative (04/17/2023)  DIAGNOSTIC Echocardiogram: EF 65%, mild mitral valve regurgitation (2024) Cardiac monitor: atrial fibrillation 100%, average heart rate 95 bpm (2024) Risk Assessment/Calculations:            Physical Exam:   VS:  BP 134/82   Pulse 85   Ht 6\' 3"  (1.905 m)   Wt 201 lb (91.2 kg)   SpO2 97%   BMI 25.12 kg/m    Wt Readings from Last 3 Encounters:  06/14/23 201 lb (91.2 kg)  12/15/22 203 lb (92.1 kg)  07/07/22 215 lb 3.2 oz (97.6 kg)    GEN: Well nourished, well developed in no acute distress NECK: No JVD; No carotid bruits CARDIAC: Irreg normal rate, no murmurs, no rubs, no gallops RESPIRATORY:  Clear to auscultation without rales, wheezing or rhonchi  ABDOMEN: Soft, non-tender, non-distended EXTREMITIES:  No edema; left 2nd finger distal amputation   ASSESSMENT AND PLAN: .    Assessment and  Plan Assessment & Plan Atrial Fibrillation Permanent atrial fibrillation with good rate control. He has been in atrial fibrillation for several years, with a CHADS-VASc score of 4. Echocardiogram in 2024 showed an ejection fraction of 65% with mild mitral valve regurgitation. He is on Diltiazem for rate control and Apixaban for anticoagulation. Ablation was discussed, but the success rate is lower due to the prolonged duration of atrial fibrillation and previous steroid use. The success rate for ablation in long-standing atrial fibrillation is less, whereas it is 80-85% for paroxysmal atrial fibrillation. He is relatively asymptomatic and prefers to avoid invasive procedures at this time. There is a risk of recurrence of atrial fibrillation if prednisone is restarted. - Continue Diltiazem 120 mg daily for rate control - Continue Apixaban 5 mg BID for anticoagulation - Discuss ablation with Dr. Nelly Laurence again if desired  Embolic Stroke Embolic stroke likely secondary to atrial fibrillation. He is on anticoagulation therapy with Apixaban to reduce the risk of future strokes. - Continue Apixaban 5 mg BID for stroke prevention  Temporal Arteritis Temporal arteritis with previous treatment including steroids and Tocilizumab (Actemra). He experienced side effects from prednisone, including cataracts and muscle weakness. Currently off Actemra since December. There is a risk of rebound symptoms after stopping Actemra and prednisone, with a 50% chance of recurrence. He has experienced some vasculitis symptoms in fingers. Advised to monitor for symptoms and use prednisone as needed. - Use prednisone as needed for symptom control - Follow up with ophthalmology as needed           Signed, Donato Schultz, MD

## 2023-09-04 DIAGNOSIS — Z23 Encounter for immunization: Secondary | ICD-10-CM | POA: Diagnosis not present

## 2023-10-08 DIAGNOSIS — Z0000 Encounter for general adult medical examination without abnormal findings: Secondary | ICD-10-CM | POA: Diagnosis not present

## 2023-10-08 DIAGNOSIS — E7800 Pure hypercholesterolemia, unspecified: Secondary | ICD-10-CM | POA: Diagnosis not present

## 2023-10-08 DIAGNOSIS — I4891 Unspecified atrial fibrillation: Secondary | ICD-10-CM | POA: Diagnosis not present

## 2023-10-08 DIAGNOSIS — Z8546 Personal history of malignant neoplasm of prostate: Secondary | ICD-10-CM | POA: Diagnosis not present

## 2023-10-08 DIAGNOSIS — R17 Unspecified jaundice: Secondary | ICD-10-CM | POA: Diagnosis not present

## 2023-10-08 DIAGNOSIS — R7303 Prediabetes: Secondary | ICD-10-CM | POA: Diagnosis not present

## 2023-10-08 DIAGNOSIS — D582 Other hemoglobinopathies: Secondary | ICD-10-CM | POA: Diagnosis not present

## 2023-10-08 DIAGNOSIS — Z8673 Personal history of transient ischemic attack (TIA), and cerebral infarction without residual deficits: Secondary | ICD-10-CM | POA: Diagnosis not present

## 2023-10-08 DIAGNOSIS — I10 Essential (primary) hypertension: Secondary | ICD-10-CM | POA: Diagnosis not present

## 2023-10-08 DIAGNOSIS — M5030 Other cervical disc degeneration, unspecified cervical region: Secondary | ICD-10-CM | POA: Diagnosis not present

## 2023-10-08 DIAGNOSIS — M316 Other giant cell arteritis: Secondary | ICD-10-CM | POA: Diagnosis not present

## 2023-10-19 DIAGNOSIS — H468 Other optic neuritis: Secondary | ICD-10-CM | POA: Diagnosis not present

## 2023-10-19 DIAGNOSIS — H0102B Squamous blepharitis left eye, upper and lower eyelids: Secondary | ICD-10-CM | POA: Diagnosis not present

## 2023-10-19 DIAGNOSIS — H401411 Capsular glaucoma with pseudoexfoliation of lens, right eye, mild stage: Secondary | ICD-10-CM | POA: Diagnosis not present

## 2023-10-19 DIAGNOSIS — H0102A Squamous blepharitis right eye, upper and lower eyelids: Secondary | ICD-10-CM | POA: Diagnosis not present

## 2023-10-19 DIAGNOSIS — H3581 Retinal edema: Secondary | ICD-10-CM | POA: Diagnosis not present

## 2023-10-19 DIAGNOSIS — H43813 Vitreous degeneration, bilateral: Secondary | ICD-10-CM | POA: Diagnosis not present

## 2023-10-19 DIAGNOSIS — M316 Other giant cell arteritis: Secondary | ICD-10-CM | POA: Diagnosis not present

## 2023-12-11 DIAGNOSIS — Z6824 Body mass index (BMI) 24.0-24.9, adult: Secondary | ICD-10-CM | POA: Diagnosis not present

## 2023-12-11 DIAGNOSIS — Z79899 Other long term (current) drug therapy: Secondary | ICD-10-CM | POA: Diagnosis not present

## 2023-12-11 DIAGNOSIS — Z7952 Long term (current) use of systemic steroids: Secondary | ICD-10-CM | POA: Diagnosis not present

## 2023-12-11 DIAGNOSIS — M316 Other giant cell arteritis: Secondary | ICD-10-CM | POA: Diagnosis not present

## 2023-12-11 DIAGNOSIS — L98491 Non-pressure chronic ulcer of skin of other sites limited to breakdown of skin: Secondary | ICD-10-CM | POA: Diagnosis not present

## 2024-04-16 ENCOUNTER — Ambulatory Visit: Attending: Cardiology | Admitting: Cardiology

## 2024-04-16 ENCOUNTER — Encounter: Payer: Self-pay | Admitting: Cardiology

## 2024-04-16 VITALS — BP 126/70 | HR 80 | Ht 75.0 in | Wt 205.6 lb

## 2024-04-16 DIAGNOSIS — M316 Other giant cell arteritis: Secondary | ICD-10-CM | POA: Diagnosis not present

## 2024-04-16 DIAGNOSIS — H469 Unspecified optic neuritis: Secondary | ICD-10-CM | POA: Insufficient documentation

## 2024-04-16 DIAGNOSIS — I63411 Cerebral infarction due to embolism of right middle cerebral artery: Secondary | ICD-10-CM | POA: Diagnosis not present

## 2024-04-16 DIAGNOSIS — I4811 Longstanding persistent atrial fibrillation: Secondary | ICD-10-CM | POA: Insufficient documentation

## 2024-04-16 NOTE — Progress Notes (Signed)
 " Cardiology Office Note:  .   Date:  04/16/2024  ID:  Patrick Salinas, DOB May 06, 1950, MRN [id number] PCP: Leonel Cole, MD  [facility] Providers Cardiologist:  Oneil Parchment, MD     History of Present Illness: .   Patrick Salinas is a 74 y.o. male Discussed the use of AI scribe   History of Present Illness Patrick Salinas is a 74 year old male who presents for a routine follow-up.  He has a history of atrial fibrillation, with a heart rate typically around 80 beats per minute. He uses a smartwatch to monitor his heart rate. His current medications include Eliquis , Crestor , and diltiazem , all of which have been recently refilled.  He has a history of autoimmune disease and was on prednisone  for a year until July 2025 and Actemra  until December 2024. After receiving a COVID-19 vaccine in November 2021, he experienced headaches, body aches, and severe leg weakness, which he attributes to the absence of Actemra . His rheumatologist acknowledged the potential for such reactions in patients with autoimmune diseases.  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 He recently received a shingles vaccine without any adverse reaction.  He underwent robotic prostate surgery in 2016, with consistently low PSA levels of 0.01. A recent test showed a PSA of 0.02.  He is active, playing tennis multiple times a week and engaging in yard work. He has resumed these activities after recovering from previous health setbacks. He recently traveled to [place]. He has a son who recently asked him to move his antique Sela '[id number] from his garage, which he has since done.      Studies Reviewed: .        Results Labs LDL (Wednesday April 16, 2024): 89 Creatinine (Wednesday April 16, 2024): 1.01 Hemoglobin (Wednesday April 16, 2024): 16.8 A1c (Wednesday April 16, 2024): 5.9 ALT (Wednesday April 16, 2024): 14 PSA (Wednesday April 16, 2024):  0.02 Risk Assessment/Calculations:            Physical Exam:   VS:  BP 126/70   Pulse 80   Ht 6' 3 (1.905 m)   Wt 205 lb 9.6 oz (93.3 kg)   SpO2 96%   BMI 25.70 kg/m    Wt Readings from Last 3 Encounters:  04/16/24 205 lb 9.6 oz (93.3 kg)  06/14/23 201 lb (91.2 kg)  12/15/22 203 lb (92.1 kg)    GEN: Well nourished, well developed in no acute distress NECK: No JVD; No carotid bruits CARDIAC: IRRR, no murmurs, no rubs, no gallops RESPIRATORY:  Clear to auscultation without rales, wheezing or rhonchi  ABDOMEN: Soft, non-tender, non-distended EXTREMITIES:  No edema; No deformity   ASSESSMENT AND PLAN: .    Assessment and Plan Assessment & Plan Longstanding persistent atrial fibrillation Atrial fibrillation is well-controlled with heart rate maintained around 80 bpm. No recent episodes of tachycardia or palpitations reported. Continues on Eliquis  for anticoagulation to prevent embolic events. - Continue Eliquis  5 mg oral BID - Continue Cardizem  120 mg oral daily - Continue Crestor  20 mg oral daily  Temporal arteritis Previously managed with prednisone  and Actemra . Recent adverse reaction to COVID vaccine noted, with symptoms of headaches and leg weakness, likely due to post-injection syndrome. Symptoms resolved with prednisone . Rheumatologist advised monitoring for symptoms post-vaccination due to potential immune response. - Continue to monitor for symptoms post-vaccination - Carry prednisone  when traveling for potential flare-ups per recommendation from rheumatologist. -He has  made significant progress         Dispo: 1 yr  Signed, Oneil Parchment, MD   "

## 2024-04-16 NOTE — Patient Instructions (Signed)
